# Patient Record
Sex: Female | Born: 1945 | Race: White | Hispanic: No | Marital: Married
Health system: Southern US, Academic
[De-identification: ages and names within clinical notes are randomized; demographics above are authoritative.]

## PROBLEM LIST (undated history)

## (undated) ENCOUNTER — Encounter: Attending: Pharmacist | Primary: Pharmacist

## (undated) ENCOUNTER — Ambulatory Visit

## (undated) ENCOUNTER — Encounter: Attending: Family Medicine | Primary: Family Medicine

## (undated) ENCOUNTER — Encounter

## (undated) ENCOUNTER — Ambulatory Visit: Payer: MEDICARE

## (undated) ENCOUNTER — Encounter: Attending: Gastroenterology | Primary: Gastroenterology

## (undated) ENCOUNTER — Encounter: Attending: Registered" | Primary: Registered"

## (undated) ENCOUNTER — Ambulatory Visit: Attending: Pharmacist | Primary: Pharmacist

## (undated) ENCOUNTER — Telehealth

## (undated) ENCOUNTER — Ambulatory Visit: Payer: MEDICARE | Attending: Obstetrics & Gynecology | Primary: Obstetrics & Gynecology

## (undated) ENCOUNTER — Ambulatory Visit: Payer: MEDICARE | Attending: Pharmacist | Primary: Pharmacist

## (undated) DIAGNOSIS — K269 Duodenal ulcer, unspecified as acute or chronic, without hemorrhage or perforation: Secondary | ICD-10-CM

## (undated) DIAGNOSIS — F32A Depression, unspecified: Secondary | ICD-10-CM

## (undated) DIAGNOSIS — R87610 Atypical squamous cells of undetermined significance on cytologic smear of cervix (ASC-US): Secondary | ICD-10-CM

## (undated) DIAGNOSIS — J479 Bronchiectasis, uncomplicated: Secondary | ICD-10-CM

## (undated) DIAGNOSIS — K219 Gastro-esophageal reflux disease without esophagitis: Secondary | ICD-10-CM

## (undated) DIAGNOSIS — Z8719 Personal history of other diseases of the digestive system: Secondary | ICD-10-CM

## (undated) DIAGNOSIS — F329 Major depressive disorder, single episode, unspecified: Secondary | ICD-10-CM

## (undated) DIAGNOSIS — T7840XA Allergy, unspecified, initial encounter: Secondary | ICD-10-CM

## (undated) DIAGNOSIS — I10 Essential (primary) hypertension: Secondary | ICD-10-CM

## (undated) DIAGNOSIS — E7800 Pure hypercholesterolemia, unspecified: Secondary | ICD-10-CM

## (undated) DIAGNOSIS — Z8619 Personal history of other infectious and parasitic diseases: Secondary | ICD-10-CM

## (undated) DIAGNOSIS — Z8669 Personal history of other diseases of the nervous system and sense organs: Secondary | ICD-10-CM

## (undated) HISTORY — PX: BRONCHOSCOPY: SUR163

## (undated) HISTORY — DX: Personal history of other diseases of the nervous system and sense organs: Z86.69

## (undated) HISTORY — DX: Bronchiectasis, uncomplicated: J47.9

## (undated) HISTORY — PX: EYE SURGERY: SHX253

## (undated) HISTORY — DX: Personal history of other infectious and parasitic diseases: Z86.19

## (undated) HISTORY — PX: NASAL SINUS SURGERY: SHX719

## (undated) HISTORY — PX: COLONOSCOPY: SHX174

## (undated) HISTORY — DX: Atypical squamous cells of undetermined significance on cytologic smear of cervix (ASC-US): R87.610

## (undated) HISTORY — DX: Allergy, unspecified, initial encounter: T78.40XA

## (undated) HISTORY — DX: Duodenal ulcer, unspecified as acute or chronic, without hemorrhage or perforation: K26.9

## (undated) HISTORY — PX: CATARACT EXTRACTION W/ INTRAOCULAR LENS  IMPLANT, BILATERAL: SHX1307

## (undated) HISTORY — PX: TONSILLECTOMY: SUR1361

---
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
## 2002-03-20 DIAGNOSIS — M810 Age-related osteoporosis without current pathological fracture: Secondary | ICD-10-CM | POA: Insufficient documentation

## 2003-09-06 ENCOUNTER — Other Ambulatory Visit: Payer: Self-pay

## 2011-06-04 DIAGNOSIS — Z1211 Encounter for screening for malignant neoplasm of colon: Secondary | ICD-10-CM | POA: Insufficient documentation

## 2011-12-27 DIAGNOSIS — N6019 Diffuse cystic mastopathy of unspecified breast: Secondary | ICD-10-CM | POA: Insufficient documentation

## 2012-01-24 ENCOUNTER — Ambulatory Visit: Payer: Self-pay | Admitting: Physical Medicine and Rehabilitation

## 2012-03-01 ENCOUNTER — Encounter: Payer: Self-pay | Admitting: Neurological Surgery

## 2012-03-22 ENCOUNTER — Encounter: Payer: Self-pay | Admitting: Neurological Surgery

## 2012-05-31 DIAGNOSIS — R7309 Other abnormal glucose: Secondary | ICD-10-CM | POA: Insufficient documentation

## 2012-06-08 DIAGNOSIS — R12 Heartburn: Secondary | ICD-10-CM | POA: Insufficient documentation

## 2012-06-10 ENCOUNTER — Ambulatory Visit: Payer: Self-pay | Admitting: Internal Medicine

## 2013-08-29 DIAGNOSIS — K2100 Gastro-esophageal reflux disease with esophagitis, without bleeding: Secondary | ICD-10-CM | POA: Insufficient documentation

## 2013-08-29 DIAGNOSIS — E785 Hyperlipidemia, unspecified: Secondary | ICD-10-CM | POA: Insufficient documentation

## 2013-12-04 DIAGNOSIS — G8929 Other chronic pain: Secondary | ICD-10-CM | POA: Insufficient documentation

## 2013-12-04 DIAGNOSIS — M542 Cervicalgia: Secondary | ICD-10-CM

## 2013-12-04 DIAGNOSIS — M545 Low back pain, unspecified: Secondary | ICD-10-CM | POA: Insufficient documentation

## 2014-01-23 DIAGNOSIS — G43909 Migraine, unspecified, not intractable, without status migrainosus: Secondary | ICD-10-CM | POA: Insufficient documentation

## 2014-05-22 ENCOUNTER — Emergency Department: Payer: Self-pay | Admitting: Emergency Medicine

## 2014-10-07 DIAGNOSIS — N814 Uterovaginal prolapse, unspecified: Secondary | ICD-10-CM | POA: Insufficient documentation

## 2014-11-04 DIAGNOSIS — N8110 Cystocele, unspecified: Secondary | ICD-10-CM | POA: Insufficient documentation

## 2014-11-21 ENCOUNTER — Other Ambulatory Visit: Payer: Self-pay | Admitting: Obstetrics and Gynecology

## 2014-11-21 ENCOUNTER — Other Ambulatory Visit: Payer: Self-pay | Admitting: Internal Medicine

## 2014-11-21 DIAGNOSIS — Z1231 Encounter for screening mammogram for malignant neoplasm of breast: Secondary | ICD-10-CM

## 2014-11-29 ENCOUNTER — Other Ambulatory Visit: Payer: Self-pay | Admitting: Obstetrics and Gynecology

## 2014-11-29 ENCOUNTER — Ambulatory Visit
Admission: RE | Admit: 2014-11-29 | Discharge: 2014-11-29 | Disposition: A | Discharge status: Home / Self Care | Payer: Medicare Other | Source: Ambulatory Visit | Attending: Obstetrics and Gynecology | Admitting: Obstetrics and Gynecology

## 2014-11-29 DIAGNOSIS — Z1231 Encounter for screening mammogram for malignant neoplasm of breast: Secondary | ICD-10-CM | POA: Diagnosis present

## 2014-12-27 DIAGNOSIS — M5030 Other cervical disc degeneration, unspecified cervical region: Secondary | ICD-10-CM | POA: Insufficient documentation

## 2015-04-17 ENCOUNTER — Ambulatory Visit: Payer: Medicare Other | Admitting: Anesthesiology

## 2015-04-24 ENCOUNTER — Encounter: Payer: Self-pay | Admitting: *Deleted

## 2015-04-24 ENCOUNTER — Emergency Department
Admission: EM | Admit: 2015-04-24 | Discharge: 2015-04-24 | Disposition: A | Discharge status: Home / Self Care | Payer: Medicare Other | Attending: Emergency Medicine | Admitting: Emergency Medicine

## 2015-04-24 DIAGNOSIS — I10 Essential (primary) hypertension: Secondary | ICD-10-CM | POA: Insufficient documentation

## 2015-04-24 DIAGNOSIS — R197 Diarrhea, unspecified: Secondary | ICD-10-CM | POA: Diagnosis present

## 2015-04-24 DIAGNOSIS — A0811 Acute gastroenteropathy due to Norwalk agent: Secondary | ICD-10-CM | POA: Insufficient documentation

## 2015-04-24 DIAGNOSIS — E119 Type 2 diabetes mellitus without complications: Secondary | ICD-10-CM | POA: Diagnosis not present

## 2015-04-24 HISTORY — DX: Cystic fibrosis, unspecified: E84.9

## 2015-04-24 HISTORY — DX: Essential (primary) hypertension: I10

## 2015-04-24 LAB — LIPASE, BLOOD: Lipase: 26 U/L (ref 11–51)

## 2015-04-24 LAB — CBC
HEMATOCRIT: 42.9 % (ref 35.0–47.0)
Hemoglobin: 14.7 g/dL (ref 12.0–16.0)
MCH: 28.8 pg (ref 26.0–34.0)
MCHC: 34.4 g/dL (ref 32.0–36.0)
MCV: 83.9 fL (ref 80.0–100.0)
Platelets: 232 10*3/uL (ref 150–440)
RBC: 5.11 MIL/uL (ref 3.80–5.20)
RDW: 14 % (ref 11.5–14.5)
WBC: 5.5 10*3/uL (ref 3.6–11.0)

## 2015-04-24 LAB — COMPREHENSIVE METABOLIC PANEL
ALBUMIN: 4.5 g/dL (ref 3.5–5.0)
ALT: 22 U/L (ref 14–54)
AST: 31 U/L (ref 15–41)
Alkaline Phosphatase: 63 U/L (ref 38–126)
Anion gap: 8 (ref 5–15)
BILIRUBIN TOTAL: 1 mg/dL (ref 0.3–1.2)
BUN: 6 mg/dL (ref 6–20)
CHLORIDE: 107 mmol/L (ref 101–111)
CO2: 21 mmol/L — ABNORMAL LOW (ref 22–32)
CREATININE: 0.7 mg/dL (ref 0.44–1.00)
Calcium: 9.5 mg/dL (ref 8.9–10.3)
GFR calc Af Amer: >60 mL/min (ref >60)
GFR calc non Af Amer: >60 mL/min (ref >60)
GLUCOSE: 111 mg/dL — AB (ref 65–99)
POTASSIUM: 3.7 mmol/L (ref 3.5–5.1)
Sodium: 136 mmol/L (ref 135–145)
TOTAL PROTEIN: 7.9 g/dL (ref 6.5–8.1)

## 2015-04-24 MED ORDER — ONDANSETRON HCL 4 MG PO TABS: 4.0000 mg | ORAL_TABLET | Freq: Every day | ORAL | Status: DC | PRN

## 2015-04-24 MED ORDER — SODIUM CHLORIDE 0.9 % IV SOLN
Freq: Once | INTRAVENOUS | Status: AC
  Administered 2015-04-24: 16:00:00 via INTRAVENOUS

## 2015-04-24 MED ORDER — ONDANSETRON HCL 4 MG/2ML IJ SOLN
4.0000 mg | Freq: Once | INTRAMUSCULAR | Status: AC
  Administered 2015-04-24: 4 mg via INTRAVENOUS
  Filled 2015-04-24: qty 2

## 2015-04-24 NOTE — ED Notes (Signed)
Pt sts that she has had n/v since Sunday.  Pt denies CP, SOB, LOC or injury.  NAD.  Pt able to ambulate to room.

## 2015-04-24 NOTE — Discharge Instructions (Signed)
Norovirus Infection °A norovirus infection is caused by exposure to a virus in a group of similar viruses (noroviruses). This type of infection causes inflammation in your stomach and intestines (gastroenteritis). Norovirus is the most common cause of gastroenteritis. It also causes food poisoning. °Anyone can get a norovirus infection. It spreads very easily (contagious). You can get it from contaminated food, water, surfaces, or other people. Norovirus is found in the stool or vomit of infected people. You can spread the infection as soon as you feel sick until 2 weeks after you recover.  °Symptoms usually begin within 2 days after you become infected. Most norovirus symptoms affect the digestive system. °CAUSES °Norovirus infection is caused by contact with norovirus. You can catch norovirus if you: °· Eat or drink something contaminated with norovirus. °· Touch surfaces or objects contaminated with norovirus and then put your hand in your mouth. °· Have direct contact with an infected person who has symptoms. °· Share food, drink, or utensils with someone with who is sick with norovirus. °SIGNS AND SYMPTOMS °Symptoms of norovirus may include: °· Nausea. °· Vomiting. °· Diarrhea. °· Stomach cramps. °· Fever. °· Chills. °· Headache. °· Muscle aches. °· Tiredness. °DIAGNOSIS °Your health care provider may suspect norovirus based on your symptoms and physical exam. Your health care provider may also test a sample of your stool or vomit for the virus.  °TREATMENT °There is no specific treatment for norovirus. Most people get better without treatment in about 2 days. °HOME CARE INSTRUCTIONS °· Replace lost fluids by drinking plenty of water or rehydration fluids containing important minerals called electrolytes. This prevents dehydration. Drink enough fluid to keep your urine clear or pale yellow. °· Do not prepare food for others while you are infected. Wait at least 3 days after recovering from the illness to do  that. °PREVENTION  °· Wash your hands often, especially after using the toilet or changing a diaper. °· Wash fruits and vegetables thoroughly before preparing or serving them. °· Throw out any food that a sick person may have touched. °· Disinfect contaminated surfaces immediately after someone in the household has been sick. Use a bleach-based household cleaner. °· Immediately remove and wash soiled clothes or sheets. °SEEK MEDICAL CARE IF: °· Your vomiting, diarrhea, and stomach pain is getting worse. °· Your symptoms of norovirus do not go away after 2-3 days. °SEEK IMMEDIATE MEDICAL CARE IF:  °You develop symptoms of dehydration that do not improve with fluid replacement. This may include: °· Excessive sleepiness. °· Lack of tears. °· Dry mouth. °· Dizziness when standing. °· Weak pulse. °  °This information is not intended to replace advice given to you by your health care provider. Make sure you discuss any questions you have with your health care provider. °  °Document Released: 05/29/2002 Document Revised: 03/29/2014 Document Reviewed: 08/16/2013 °Elsevier Interactive Patient Education ©2016 [employer] ° °

## 2015-04-24 NOTE — ED Provider Notes (Signed)
[facility] Emergency Department Provider Note     Time seen: ----------------------------------------- 3:53 PM on 04/24/2015 -----------------------------------------    I have reviewed the triage vital signs and the nursing notes.   HISTORY  Chief Complaint Emesis and Diarrhea    HPI Brenda Clarke is a 70 y.o. female who presents ER for vomiting and diarrhea for the last 4 days. Patient states she is unable to eat, has a history of cystic fibrosis. She was sent by her primary care doctor for same. She denies fevers, chills, chest pain, shortness of breath at this time. She states she has any anything or had anything to drink all the last 4 days.   Past Medical History  Diagnosis Date  . Cystic fibrosis ([place])   . Hypertension   . Diabetes mellitus without complication ([place])     There are no active problems to display for this patient.   History reviewed. No pertinent past surgical history.  Allergies Codeine  Social History Social History  Substance Use Topics  . Smoking status: Never Smoker   . Smokeless tobacco: None  . Alcohol Use: None    Review of Systems Constitutional: Negative for fever. Eyes: Negative for visual changes. ENT: Negative for sore throat. Cardiovascular: Negative for chest pain. Respiratory: Negative for shortness of breath. Gastrointestinal: Negative for abdominal pain, positive for vomiting and diarrhea Genitourinary: Negative for dysuria. Musculoskeletal: Negative for back pain. Skin: Negative for rash. Neurological: Negative for headaches, focal weakness or numbness.  10-point ROS otherwise negative.  ____________________________________________   PHYSICAL EXAM:  VITAL SIGNS: ED Triage Vitals  Enc Vitals Group     BP 04/24/15 1352 140/75 mmHg     Pulse Rate 04/24/15 1352 70     Resp 04/24/15 1352 18     Temp 04/24/15 1352 98.3 F (36.8 C)     Temp Source 04/24/15 1352 Oral     SpO2 04/24/15  1352 96 %     Weight 04/24/15 1352 122 lb (55.339 kg)     Height 04/24/15 1352 5\' 5"  (1.651 m)     Head Cir --      Peak Flow --      Pain Score --      Pain Loc --      Pain Edu? --      Excl. in [place]? --     Constitutional: Alert and oriented. Well appearing and in no distress. Eyes: Conjunctivae are normal. PERRL. Normal extraocular movements. ENT   Head: Normocephalic and atraumatic.   Nose: No congestion/rhinnorhea.   Mouth/Throat: Mucous membranes are moist.   Neck: No stridor. Cardiovascular: Normal rate, regular rhythm. Normal and symmetric distal pulses are present in all extremities. No murmurs, rubs, or gallops. Respiratory: Normal respiratory effort without tachypnea nor retractions. Breath sounds are clear and equal bilaterally. No wheezes/rales/rhonchi. Gastrointestinal: Soft and nontender. No distention. No abdominal bruits.  Musculoskeletal: Nontender with normal range of motion in all extremities. No joint effusions.  No lower extremity tenderness nor edema. Neurologic:  Normal speech and language. No gross focal neurologic deficits are appreciated. Speech is normal. No gait instability. Skin:  Skin is warm, dry and intact. No rash noted. Psychiatric: Mood and affect are normal. Speech and behavior are normal. Patient exhibits appropriate insight and judgment. ____________________________________________  ED COURSE:  Pertinent labs & imaging results that were available during my care of the patient were reviewed by me and considered in my medical decision making (see chart for details).  patient  is in no acute distress, will give IV fluid and antiemetics. ____________________________________________    LABS (pertinent positives/negatives)  Labs Reviewed  COMPREHENSIVE METABOLIC PANEL - Abnormal; Notable for the following:    CO2 21 (*)    Glucose, Bld 111 (*)    All other components within normal limits  LIPASE, BLOOD  CBC  URINALYSIS  COMPLETEWITH MICROSCOPIC ([facility] ONLY)  ____________________________________________  FINAL ASSESSMENT AND PLAN  Gastroenteritis  Plan: Patient with labs and dictated as  above. Patient looks remarkably good and labs are normal. She was given IV fluids, she is stable for outpatient follow-up with her primary care doctor and is able to tolerate food and drink by mouth.    Earleen Newport, MD   Earleen Newport, MD 04/24/15 1726

## 2015-04-24 NOTE — ED Notes (Signed)
States vomiting and diaherra for 4 days, states unable to eat, hx of cystic fibrosis, pt sent from PCP office

## 2015-06-03 ENCOUNTER — Emergency Department
Admission: EM | Admit: 2015-06-03 | Discharge: 2015-06-03 | Disposition: A | Discharge status: Home / Self Care | Payer: Medicare Other | Attending: Student | Admitting: Student

## 2015-06-03 ENCOUNTER — Encounter: Payer: Self-pay | Admitting: Emergency Medicine

## 2015-06-03 ENCOUNTER — Emergency Department: Payer: Medicare Other

## 2015-06-03 DIAGNOSIS — R197 Diarrhea, unspecified: Secondary | ICD-10-CM | POA: Insufficient documentation

## 2015-06-03 DIAGNOSIS — E119 Type 2 diabetes mellitus without complications: Secondary | ICD-10-CM | POA: Insufficient documentation

## 2015-06-03 DIAGNOSIS — I10 Essential (primary) hypertension: Secondary | ICD-10-CM | POA: Insufficient documentation

## 2015-06-03 DIAGNOSIS — R110 Nausea: Secondary | ICD-10-CM | POA: Diagnosis not present

## 2015-06-03 DIAGNOSIS — N390 Urinary tract infection, site not specified: Secondary | ICD-10-CM | POA: Diagnosis not present

## 2015-06-03 LAB — COMPREHENSIVE METABOLIC PANEL
ALT: 21 U/L (ref 14–54)
AST: 33 U/L (ref 15–41)
Albumin: 4.9 g/dL (ref 3.5–5.0)
Alkaline Phosphatase: 68 U/L (ref 38–126)
Anion gap: 11 (ref 5–15)
BUN: 14 mg/dL (ref 6–20)
CHLORIDE: 99 mmol/L — AB (ref 101–111)
CO2: 24 mmol/L (ref 22–32)
Calcium: 10.1 mg/dL (ref 8.9–10.3)
Creatinine, Ser: 0.84 mg/dL (ref 0.44–1.00)
GFR calc Af Amer: >60 mL/min (ref >60)
Glucose, Bld: 103 mg/dL — ABNORMAL HIGH (ref 65–99)
POTASSIUM: 3.4 mmol/L — AB (ref 3.5–5.1)
SODIUM: 134 mmol/L — AB (ref 135–145)
Total Bilirubin: 1.1 mg/dL (ref 0.3–1.2)
Total Protein: 8.4 g/dL — ABNORMAL HIGH (ref 6.5–8.1)

## 2015-06-03 LAB — CBC WITH DIFFERENTIAL/PLATELET
Basophils Absolute: 0 10*3/uL (ref 0–0.1)
Basophils Relative: 0 %
Eosinophils Absolute: 0.1 10*3/uL (ref 0–0.7)
Eosinophils Relative: 1 %
HCT: 46.7 % (ref 35.0–47.0)
HEMOGLOBIN: 16 g/dL (ref 12.0–16.0)
LYMPHS ABS: 1 10*3/uL (ref 1.0–3.6)
LYMPHS PCT: 10 %
MCH: 29.2 pg (ref 26.0–34.0)
MCHC: 34.2 g/dL (ref 32.0–36.0)
MCV: 85.3 fL (ref 80.0–100.0)
Monocytes Absolute: 0.6 10*3/uL (ref 0.2–0.9)
Monocytes Relative: 6 %
NEUTROS PCT: 83 %
Neutro Abs: 8.1 10*3/uL — ABNORMAL HIGH (ref 1.4–6.5)
Platelets: 255 10*3/uL (ref 150–440)
RBC: 5.47 MIL/uL — AB (ref 3.80–5.20)
RDW: 13.9 % (ref 11.5–14.5)
WBC: 9.9 10*3/uL (ref 3.6–11.0)

## 2015-06-03 LAB — URINALYSIS COMPLETE WITH MICROSCOPIC (ARMC ONLY)
Bilirubin Urine: NEGATIVE
Glucose, UA: NEGATIVE mg/dL
HGB URINE DIPSTICK: NEGATIVE
Ketones, ur: NEGATIVE mg/dL
Nitrite: NEGATIVE
Protein, ur: NEGATIVE mg/dL
SPECIFIC GRAVITY, URINE: 1.029 (ref 1.005–1.030)
pH: 5 (ref 5.0–8.0)

## 2015-06-03 MED ORDER — NITROFURANTOIN MONOHYD MACRO 100 MG PO CAPS
100.0000 mg | ORAL_CAPSULE | Freq: Once | ORAL | Status: AC
  Administered 2015-06-03: 100 mg via ORAL
  Filled 2015-06-03: qty 1

## 2015-06-03 MED ORDER — SODIUM CHLORIDE 0.9 % IV BOLUS (SEPSIS)
1000.0000 mL | Freq: Once | INTRAVENOUS | Status: AC
  Administered 2015-06-03: 1000 mL via INTRAVENOUS

## 2015-06-03 MED ORDER — ONDANSETRON 4 MG PO TBDP: 4.0000 mg | ORAL_TABLET | Freq: Three times a day (TID) | ORAL | Status: DC | PRN

## 2015-06-03 MED ORDER — NITROFURANTOIN MONOHYD MACRO 100 MG PO CAPS: 100.0000 mg | ORAL_CAPSULE | Freq: Two times a day (BID) | ORAL | Status: DC

## 2015-06-03 MED ORDER — ONDANSETRON 4 MG PO TBDP
4.0000 mg | ORAL_TABLET | Freq: Once | ORAL | Status: AC
  Administered 2015-06-03: 4 mg via ORAL
  Filled 2015-06-03: qty 1

## 2015-06-03 NOTE — ED Notes (Signed)
Presents with fever cough and cold sx's for a few days   Feels like she is "dehydrated"

## 2015-06-03 NOTE — ED Provider Notes (Signed)
[facility] Emergency Department Provider Note  ____________________________________________  Time seen: Approximately 8:20 PM  I have reviewed the triage vital signs and the nursing notes.   HISTORY  Chief Complaint Fever and Diarrhea    HPI Brenda Clarke is a 70 y.o. female with history of cystic fibrosis, hypertension, diabetes who presents for evaluation of 3 days of nausea as well as diarrhea, gradual onset, now improved however she feels generally weak, no modifying factors. She is here because she feels dehydrated. She has had nausea but no vomiting. She has had 3 episodes of nonbloody diarrhea. She adamantly denies fevers. Though it was noted in the triage assessment, she also adamantly denies cough, or any upper respiratory tract infection symptoms. She denies any chest pain, shortness of breath, abdominal pain, pain or burning with urination.   Past Medical History  Diagnosis Date  . Cystic fibrosis ([place])   . Hypertension   . Diabetes mellitus without complication ([place])     There are no active problems to display for this patient.   History reviewed. No pertinent past surgical history.  Current Outpatient Rx  Name  Route  Sig  Dispense  Refill  . nitrofurantoin, macrocrystal-monohydrate, (MACROBID) 100 MG capsule   Oral   Take 1 capsule (100 mg total) by mouth 2 (two) times daily.   14 capsule   0   . ondansetron (ZOFRAN ODT) 4 MG disintegrating tablet   Oral   Take 1 tablet (4 mg total) by mouth every 8 (eight) hours as needed for nausea or vomiting.   8 tablet   0   . ondansetron (ZOFRAN) 4 MG tablet   Oral   Take 1 tablet (4 mg total) by mouth daily as needed for nausea or vomiting.   20 tablet   1     Allergies Codeine  History reviewed. No pertinent family history.  Social History Social History  Substance Use Topics  . Smoking status: Never Smoker   . Smokeless tobacco: None  . Alcohol Use: None    Review of  Systems Constitutional: No fever/chills Eyes: No visual changes. ENT: No sore throat. Cardiovascular: Denies chest pain. Respiratory: Denies shortness of breath. Gastrointestinal: No abdominal pain.  + nausea, no vomiting.  + diarrhea.  No constipation. Genitourinary: Negative for dysuria. Musculoskeletal: Negative for back pain. Skin: Negative for rash. Neurological: Negative for headaches, focal weakness or numbness.  10-point ROS otherwise negative.  ____________________________________________   PHYSICAL EXAM:  VITAL SIGNS: ED Triage Vitals  Enc Vitals Group     BP 06/03/15 1637 160/93 mmHg     Pulse Rate 06/03/15 1637 80     Resp 06/03/15 1637 20     Temp 06/03/15 1637 97.8 F (36.6 C)     Temp Source 06/03/15 1637 Oral     SpO2 06/03/15 1637 98 %     Weight 06/03/15 1637 123 lb (55.792 kg)     Height 06/03/15 1637 5\' 6"  (1.676 m)     Head Cir --      Peak Flow --      Pain Score 06/03/15 1649 0     Pain Loc --      Pain Edu? --      Excl. in [place]? --     Constitutional: Alert and oriented. Well appearing and in no acute distress. Eyes: Conjunctivae are normal. PERRL. EOMI. Head: Atraumatic. Nose: No congestion/rhinnorhea. Mouth/Throat: Mucous membranes are moist.  Oropharynx non-erythematous. Neck: No stridor. Supple without meningismus.  Cardiovascular: Normal rate, regular rhythm. Grossly normal heart sounds.  Good peripheral circulation. Respiratory: Normal respiratory effort.  No retractions. Lungs CTAB. Gastrointestinal: Soft and nontender. No distention.  No CVA tenderness. Genitourinary: deferred Musculoskeletal: No lower extremity tenderness nor edema.  No joint effusions. Neurologic:  Normal speech and language. No gross focal neurologic deficits are appreciated. No gait instability. Skin:  Skin is warm, dry and intact. No rash noted. Psychiatric: Mood and affect are normal. Speech and behavior are  normal.  ____________________________________________   LABS (all labs ordered are listed, but only abnormal results are displayed)  Labs Reviewed  COMPREHENSIVE METABOLIC PANEL - Abnormal; Notable for the following:    Sodium 134 (*)    Potassium 3.4 (*)    Chloride 99 (*)    Glucose, Bld 103 (*)    Total Protein 8.4 (*)    All other components within normal limits  CBC WITH DIFFERENTIAL/PLATELET - Abnormal; Notable for the following:    RBC 5.47 (*)    Neutro Abs 8.1 (*)    All other components within normal limits  URINALYSIS COMPLETEWITH MICROSCOPIC ([facility] ONLY) - Abnormal; Notable for the following:    Color, Urine YELLOW (*)    APPearance CLEAR (*)    Leukocytes, UA 1+ (*)    Bacteria, UA RARE (*)    Squamous Epithelial / LPF 0-5 (*)    All other components within normal limits  URINE CULTURE   ____________________________________________  EKG  none ____________________________________________  RADIOLOGY  none ____________________________________________   PROCEDURES  Procedure(s) performed: None  Critical Care performed: No  ____________________________________________   INITIAL IMPRESSION / ASSESSMENT AND PLAN / ED COURSE  Pertinent labs & imaging results that were available during my care of the patient were reviewed by me and considered in my medical decision making (see chart for details).   Brenda Clarke is a 70 y.o. female with history of cystic fibrosis, hypertension, diabetes who presents for evaluation of 3 days of nausea as well as diarrhea, gradual onset, now improved however she feels generally weak. On exam, she is well-appearing and in no acute distress. Vital signs stable, she is afebrile. She has a benign physical examination and no acute plain complaints. CMP and CBC are generally unremarkable. Awaiting urinalysis. Suspect viral syndrome of the mostly because of her nausea and 3 episodes of diarrhea however given that she denies any  abdominal pain, fevers, upper respiratory tract infection symptoms, I do not think she requires any imaging or flu screen.   ----------------------------------------- 11:10 PM on 06/03/2015 ----------------------------------------- Analysis concerning for possible urinary tract infection. GFR, we'll give Macrobid. The patient is tolerating by mouth intake after Zofran. She has had no vomiting since arrival to the emergency department. We discussed return precautions, need for close PCP follow-up and she is comfortable with the discharge plan. DC home.  ____________________________________________   FINAL CLINICAL IMPRESSION(S) / ED DIAGNOSES  Final diagnoses:  Nausea  Diarrhea, unspecified type  UTI (lower urinary tract infection)      Joanne Gavel, MD 06/03/15 2311

## 2015-06-03 NOTE — ED Notes (Signed)
Reports diarrhea Saturday and Sunday.  States she thinks she is dehydrated.

## 2015-06-03 NOTE — ED Notes (Signed)
Pt expresses desire to leave, but also sts "I just don't feel good"

## 2015-06-03 NOTE — ED Notes (Addendum)
Pt vehemently denies n/v/d.  Pt denies having cough.  Pt sts that she simply hasn't had an appetite in 3 days and therefore hasn't eaten or drunk.  Pt sts lack of appetite isn't unusual for her, but has never gone on for 3 days.  Pt sts she came today because she felt like she might be dehydrated and wanted fluids.

## 2015-06-05 LAB — URINE CULTURE

## 2015-10-15 ENCOUNTER — Emergency Department: Payer: Medicare Other

## 2015-10-15 ENCOUNTER — Observation Stay
Admission: EM | Admit: 2015-10-15 | Discharge: 2015-10-15 | Discharge status: Against Medical Advice | Payer: Medicare Other | Attending: Specialist | Admitting: Specialist

## 2015-10-15 ENCOUNTER — Encounter: Payer: Self-pay | Admitting: Emergency Medicine

## 2015-10-15 DIAGNOSIS — M549 Dorsalgia, unspecified: Secondary | ICD-10-CM | POA: Diagnosis not present

## 2015-10-15 DIAGNOSIS — I10 Essential (primary) hypertension: Secondary | ICD-10-CM | POA: Insufficient documentation

## 2015-10-15 DIAGNOSIS — Z820 Family history of epilepsy and other diseases of the nervous system: Secondary | ICD-10-CM | POA: Insufficient documentation

## 2015-10-15 DIAGNOSIS — Z79899 Other long term (current) drug therapy: Secondary | ICD-10-CM | POA: Insufficient documentation

## 2015-10-15 DIAGNOSIS — G459 Transient cerebral ischemic attack, unspecified: Principal | ICD-10-CM | POA: Diagnosis present

## 2015-10-15 DIAGNOSIS — G8929 Other chronic pain: Secondary | ICD-10-CM | POA: Insufficient documentation

## 2015-10-15 DIAGNOSIS — M542 Cervicalgia: Secondary | ICD-10-CM | POA: Diagnosis not present

## 2015-10-15 DIAGNOSIS — E119 Type 2 diabetes mellitus without complications: Secondary | ICD-10-CM | POA: Diagnosis not present

## 2015-10-15 DIAGNOSIS — R4781 Slurred speech: Secondary | ICD-10-CM | POA: Diagnosis present

## 2015-10-15 DIAGNOSIS — I639 Cerebral infarction, unspecified: Secondary | ICD-10-CM

## 2015-10-15 DIAGNOSIS — Z5321 Procedure and treatment not carried out due to patient leaving prior to being seen by health care provider: Secondary | ICD-10-CM | POA: Diagnosis not present

## 2015-10-15 LAB — COMPREHENSIVE METABOLIC PANEL
ALBUMIN: 4.4 g/dL (ref 3.5–5.0)
ALK PHOS: 74 U/L (ref 38–126)
ALT: 17 U/L (ref 14–54)
AST: 25 U/L (ref 15–41)
Anion gap: 14 (ref 5–15)
BILIRUBIN TOTAL: 0.9 mg/dL (ref 0.3–1.2)
BUN: 27 mg/dL — AB (ref 6–20)
CALCIUM: 10.4 mg/dL — AB (ref 8.9–10.3)
CHLORIDE: 108 mmol/L (ref 101–111)
CO2: 25 mmol/L (ref 22–32)
CREATININE: 1.04 mg/dL — AB (ref 0.44–1.00)
GFR calc non Af Amer: 53 mL/min — ABNORMAL LOW (ref >60)
Glucose, Bld: 99 mg/dL (ref 65–99)
Potassium: 3.9 mmol/L (ref 3.5–5.1)
Sodium: 147 mmol/L — ABNORMAL HIGH (ref 135–145)
Total Protein: 7.5 g/dL (ref 6.5–8.1)

## 2015-10-15 LAB — CBC
HCT: 42.8 % (ref 35.0–47.0)
HEMOGLOBIN: 14.5 g/dL (ref 12.0–16.0)
MCH: 29.4 pg (ref 26.0–34.0)
MCHC: 33.9 g/dL (ref 32.0–36.0)
MCV: 86.6 fL (ref 80.0–100.0)
PLATELETS: 279 10*3/uL (ref 150–440)
RBC: 4.95 MIL/uL (ref 3.80–5.20)
RDW: 14.6 % — ABNORMAL HIGH (ref 11.5–14.5)
WBC: 7.4 10*3/uL (ref 3.6–11.0)

## 2015-10-15 LAB — DIFFERENTIAL
Basophils Absolute: 0.1 10*3/uL (ref 0–0.1)
Basophils Relative: 1 %
EOS ABS: 0.4 10*3/uL (ref 0–0.7)
EOS PCT: 5 %
LYMPHS ABS: 1.5 10*3/uL (ref 1.0–3.6)
Lymphocytes Relative: 20 %
MONO ABS: 0.7 10*3/uL (ref 0.2–0.9)
Monocytes Relative: 9 %
NEUTROS PCT: 65 %
Neutro Abs: 4.9 10*3/uL (ref 1.4–6.5)

## 2015-10-15 LAB — GLUCOSE, CAPILLARY: GLUCOSE-CAPILLARY: 98 mg/dL (ref 65–99)

## 2015-10-15 LAB — PROTIME-INR
INR: 1
PROTHROMBIN TIME: 13.2 s (ref 11.4–15.2)

## 2015-10-15 LAB — APTT: aPTT: 26 seconds (ref 24–36)

## 2015-10-15 LAB — TROPONIN I: Troponin I: <0.03 ng/mL (ref <0.03)

## 2015-10-15 MED ORDER — GABAPENTIN 100 MG PO CAPS: 100.0000 mg | ORAL_CAPSULE | Freq: Two times a day (BID) | ORAL | Status: DC

## 2015-10-15 MED ORDER — ONDANSETRON HCL 4 MG/2ML IJ SOLN: 4.0000 mg | Freq: Four times a day (QID) | INTRAMUSCULAR | Status: DC | PRN

## 2015-10-15 MED ORDER — CALCIUM CARBONATE ANTACID 500 MG PO CHEW: 2.0000 | CHEWABLE_TABLET | Freq: Every day | ORAL | Status: DC

## 2015-10-15 MED ORDER — PANTOPRAZOLE SODIUM 40 MG PO TBEC: 40.0000 mg | DELAYED_RELEASE_TABLET | Freq: Every day | ORAL | Status: DC

## 2015-10-15 MED ORDER — FAMOTIDINE 20 MG PO TABS: 20.0000 mg | ORAL_TABLET | Freq: Two times a day (BID) | ORAL | Status: DC

## 2015-10-15 MED ORDER — IVACAFTOR 150 MG PO TABS: 1.0000 | ORAL_TABLET | Freq: Two times a day (BID) | ORAL | Status: DC

## 2015-10-15 MED ORDER — ALBUTEROL SULFATE (2.5 MG/3ML) 0.083% IN NEBU: 3.0000 mL | INHALATION_SOLUTION | Freq: Four times a day (QID) | RESPIRATORY_TRACT | Status: DC | PRN

## 2015-10-15 MED ORDER — CALCIUM CARBONATE 500 MG PO CHEW: 1000.0000 mg | CHEWABLE_TABLET | Freq: Every day | ORAL | Status: DC

## 2015-10-15 MED ORDER — ATORVASTATIN CALCIUM 20 MG PO TABS: 40.0000 mg | ORAL_TABLET | Freq: Every day | ORAL | Status: DC

## 2015-10-15 MED ORDER — SODIUM CHLORIDE 10 % IN NEBU: 4.0000 mL | INHALATION_SOLUTION | Freq: Four times a day (QID) | RESPIRATORY_TRACT | Status: DC

## 2015-10-15 MED ORDER — OMEGA-3-ACID ETHYL ESTERS 1 G PO CAPS
2.0000 g | ORAL_CAPSULE | Freq: Every day | ORAL | Status: DC
Start: 2015-10-16 — End: 2015-10-16

## 2015-10-15 MED ORDER — ENALAPRIL MALEATE 2.5 MG PO TABS: 2.5000 mg | ORAL_TABLET | Freq: Every day | ORAL | Status: DC

## 2015-10-15 MED ORDER — METOPROLOL SUCCINATE ER 25 MG PO TB24: 25.0000 mg | ORAL_TABLET | Freq: Two times a day (BID) | ORAL | Status: DC

## 2015-10-15 MED ORDER — AZTREONAM LYSINE 75 MG IN SOLR: 75.0000 mg | Freq: Three times a day (TID) | RESPIRATORY_TRACT | Status: DC

## 2015-10-15 MED ORDER — OMEGA-3 1000 MG PO CAPS: 2000.0000 mg | ORAL_CAPSULE | Freq: Every day | ORAL | Status: DC

## 2015-10-15 MED ORDER — ACETAMINOPHEN 650 MG RE SUPP: 650.0000 mg | Freq: Four times a day (QID) | RECTAL | Status: DC | PRN

## 2015-10-15 MED ORDER — ZOLPIDEM TARTRATE 5 MG PO TABS: 5.0000 mg | ORAL_TABLET | Freq: Every evening | ORAL | Status: DC | PRN

## 2015-10-15 MED ORDER — ASPIRIN 81 MG PO CHEW
CHEWABLE_TABLET | ORAL | Status: AC
  Administered 2015-10-15: 324 mg via ORAL
  Filled 2015-10-15: qty 4

## 2015-10-15 MED ORDER — SODIUM CHLORIDE 0.9% FLUSH: 3.0000 mL | Freq: Two times a day (BID) | INTRAVENOUS | Status: DC

## 2015-10-15 MED ORDER — ACETAMINOPHEN 325 MG PO TABS: 650.0000 mg | ORAL_TABLET | Freq: Four times a day (QID) | ORAL | Status: DC | PRN

## 2015-10-15 MED ORDER — GALANTAMINE HYDROBROMIDE 4 MG PO TABS
4.0000 mg | ORAL_TABLET | ORAL | Status: DC
  Filled 2015-10-15: qty 1

## 2015-10-15 MED ORDER — ASPIRIN 81 MG PO CHEW
324.0000 mg | CHEWABLE_TABLET | Freq: Once | ORAL | Status: AC
  Administered 2015-10-15: 324 mg via ORAL

## 2015-10-15 MED ORDER — ASPIRIN EC 81 MG PO TBEC: 81.0000 mg | DELAYED_RELEASE_TABLET | Freq: Every day | ORAL | Status: DC

## 2015-10-15 MED ORDER — ONDANSETRON HCL 4 MG PO TABS: 4.0000 mg | ORAL_TABLET | Freq: Four times a day (QID) | ORAL | Status: DC | PRN

## 2015-10-15 NOTE — ED Triage Notes (Signed)
Pt began with headache at 1650 and then slurred speech at 1750. Pt unable to repeat "no ifs ands or buts" correctly. Having difficulty giving symptom history. C/o weakness bilateral legs. No other symptoms.

## 2015-10-15 NOTE — Consult Note (Signed)
Met w/spouse at CT waiting room & offered pastoral care. Will provide [place] support as needed.  Tomasita Crumble Gwenda [phone]

## 2015-10-15 NOTE — ED Notes (Signed)
CBG 98  

## 2015-10-15 NOTE — H&P (Addendum)
[place] [facility] [place] NAME: Brenda Clarke    MR#:  [id number]  DATE OF BIRTH:  07-Apr-1945  DATE OF ADMISSION:  10/15/2015  PRIMARY CARE PHYSICIAN: Rusty Aus, MD   REQUESTING/REFERRING PHYSICIAN: Dr. Lisa Roca  CHIEF COMPLAINT:   Chief Complaint  Patient presents with  . Stroke Symptoms    HISTORY OF PRESENT ILLNESS:  Brenda Clarke  is a 70 y.o. female with a known history of Cystic fibrosis, diabetes, hypertension who presents to the hospital due to slurred speech. Patient says she developed slurred speech this afternoon which lasted only a few minutes and resolved on its own. She denied any headache, numbness, tingling or any focal weakness associated with it. She is complaining of this chronic neck/back pain which she has been dealing with for quite a while. She denies any other associated symptoms. Her CT head did not show any evidence of acute abnormality. Hospitalist services clinic for treatment and evaluation.  PAST MEDICAL HISTORY:   Past Medical History:  Diagnosis Date  . Cystic fibrosis ([place])   . Diabetes mellitus without complication ([place])   . Hypertension     PAST SURGICAL HISTORY:  History reviewed. No pertinent surgical history.  SOCIAL HISTORY:   Social History  Substance Use Topics  . Smoking status: Never Smoker  . Smokeless tobacco: Not on file  . Alcohol use Not on file    FAMILY HISTORY:   Family History  Problem Relation Clarke of Onset  . Alzheimer's disease Mother     DRUG ALLERGIES:   Allergies  Allergen Reactions  . Tobramycin Shortness Of Breath    wheezing  . Codeine Itching and Nausea Only  . Hydrocodone Itching    REVIEW OF SYSTEMS:   Review of Systems  Constitutional: Negative for fever and weight loss.  HENT: Negative for congestion, nosebleeds and tinnitus.   Eyes: Negative for blurred vision, double vision and redness.  Respiratory: Negative for cough, hemoptysis and shortness  of breath.   Cardiovascular: Negative for chest pain, orthopnea, leg swelling and PND.  Gastrointestinal: Negative for abdominal pain, diarrhea, melena, nausea and vomiting.  Genitourinary: Negative for dysuria, hematuria and urgency.  Musculoskeletal: Negative for falls and joint pain.  Neurological: Positive for speech change. Negative for dizziness, tingling, sensory change, focal weakness, seizures, weakness and headaches.  Endo/Heme/Allergies: Negative for polydipsia. Does not bruise/bleed easily.  Psychiatric/Behavioral: Negative for depression and memory loss. The patient is not nervous/anxious.     MEDICATIONS AT HOME:   Prior to Admission medications   Medication Sig Start Date End Date Taking? Authorizing Provider  albuterol (PROVENTIL HFA;VENTOLIN HFA) 108 (90 Base) MCG/ACT inhaler Inhale 2 puffs into the lungs every 6 (six) hours as needed. 09/25/15  Yes Historical Provider, MD  Aztreonam Lysine 75 MG SOLR Inhale 75 mg into the lungs 3 (three) times daily. Alternating months 05/26/15  Yes Historical Provider, MD  Calcium Carbonate 500 MG CHEW Chew 1,000 mg by mouth daily.   Yes Historical Provider, MD  enalapril (VASOTEC) 5 MG tablet Take 2.5 mg by mouth daily. 05/31/14  Yes Historical Provider, MD  gabapentin (NEURONTIN) 100 MG capsule Take 100 mg by mouth 2 (two) times daily.   Yes Historical Provider, MD  galantamine (RAZADYNE) 4 MG tablet Take 4 mg by mouth every morning. 06/10/15 06/09/16 Yes Historical Provider, MD  Ivacaftor 150 MG TABS Take 1 tablet by mouth 2 (two) times daily. 03/31/15 03/30/16 Yes Historical Provider, MD  metoprolol succinate (  TOPROL-XL) 25 MG 24 hr tablet Take 1 tablet by mouth 2 (two) times daily. 05/27/15  Yes Historical Provider, MD  Omega-3 1000 MG CAPS Take 2 g by mouth daily.   Yes Historical Provider, MD  omeprazole (PRILOSEC) 40 MG capsule Take 1 capsule by mouth daily. 05/27/15  Yes Historical Provider, MD  ranitidine (ZANTAC) 300 MG tablet Take 1 tablet by  mouth every evening. 05/27/15  Yes Historical Provider, MD  simvastatin (ZOCOR) 80 MG tablet Take 0.5 tablets by mouth every evening. 08/04/15  Yes Historical Provider, MD  Sodium Chloride 10 % NEBU Take 4 mLs by nebulization 4 (four) times daily. 05/30/15 05/29/16 Yes Historical Provider, MD  zolpidem (AMBIEN CR) 12.5 MG CR tablet Take 12.5 mg by mouth at bedtime. 05/22/13  Yes Historical Provider, MD      VITAL SIGNS:  Blood pressure (!) 147/86, pulse 67, temperature 98.1 F (36.7 C), resp. rate 18, height 5\' 6"  (1.676 m), weight 53.1 kg (117 lb), SpO2 98 %.  PHYSICAL EXAMINATION:  Physical Exam  GENERAL:  70 y.o.-year-old anxious patient lying in the bed in no acute distress.  EYES: Pupils equal, round, reactive to light and accommodation. No scleral icterus. Extraocular muscles intact.  HEENT: Head atraumatic, normocephalic. Oropharynx and nasopharynx clear. No oropharyngeal erythema, moist oral mucosa  NECK:  Supple, no jugular venous distention. No thyroid enlargement, no tenderness.  LUNGS: Normal breath sounds bilaterally, no wheezing, rales, rhonchi. No use of accessory muscles of respiration.  CARDIOVASCULAR: S1, S2 RRR. No murmurs, rubs, gallops, clicks.  ABDOMEN: Soft, nontender, nondistended. Bowel sounds present. No organomegaly or mass.  EXTREMITIES: No pedal edema, cyanosis, or clubbing. + 2 pedal & radial pulses b/l.   NEUROLOGIC: Cranial nerves II through XII are intact. No focal Motor or sensory deficits appreciated b/l PSYCHIATRIC: The patient is alert and oriented x 3. anxious  SKIN: No obvious rash, lesion, or ulcer.   LABORATORY PANEL:   CBC  Recent Labs Lab 10/15/15 1810  WBC 7.4  HGB 14.5  HCT 42.8  PLT 279   ------------------------------------------------------------------------------------------------------------------  Chemistries   Recent Labs Lab 10/15/15 1810  NA 147*  K 3.9  CL 108  CO2 25  GLUCOSE 99  BUN 27*  CREATININE 1.04*  CALCIUM  10.4*  AST 25  ALT 17  ALKPHOS 74  BILITOT 0.9   ------------------------------------------------------------------------------------------------------------------  Cardiac Enzymes  Recent Labs Lab 10/15/15 1810  TROPONINI <0.03   ------------------------------------------------------------------------------------------------------------------  RADIOLOGY:  Ct Head Code Stroke W/o Cm  Result Date: 10/15/2015 CLINICAL DATA:  Headache around 4:50, now with slurred speech. Code stroke. EXAM: CT HEAD WITHOUT CONTRAST TECHNIQUE: Contiguous axial images were obtained from the base of the skull through the vertex without intravenous contrast. COMPARISON:  None. FINDINGS: Scattered minimal periventricular hypodensities, left greater than right, compatible with microvascular ischemic disease. The gray-white differentiation is otherwise well maintained without CT evidence of acute large territory infarct. No intraparenchymal or extra-axial mass or hemorrhage. Normal size and configuration of the ventricles and basilar cisterns. No midline shift. Limited visualization the paranasal sinuses and mastoid air cells is normal. No air-fluid levels. Regional soft tissues appear normal. 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 Critical Value/emergent results were called by telephone at the time of interpretation on 10/15/2015 at 6:28 pm to Dr. Lisa Roca , who verbally acknowledged these results. Electronically Signed   By: Sandi Mariscal M.D.   On: 10/15/2015 18:31    IMPRESSION AND PLAN:   70 year old female with past  medical history of cystic fibrosis, hypertension, diabetes presents to the hospital with slurred speech.  1. TIA-this is a clinical diagnosis given patient's transient neurologic symptoms which have now resolved. -Patient's CT head shows no evidence of acute CVA. We will observe her overnight, get a MRI of the brain, carotid duplex, and  two-dimensional echocardiogram. -Place on baby aspirin, simvastatin  2. Essential hypertension-continue Vasotec, Toprol.  3. History of cystic fibrosis-continue aztreonam lysine, Ivacaftor, Nebulized saline.  - no acute issue  4. GERD - cont. Protonix, Pepcid.   5. Neuropathy - cont. Neurontin.    All the records are reviewed and case discussed with ED provider. Management plans discussed with the patient, family and they are in agreement.  CODE STATUS: Full   TOTAL TIME TAKING CARE OF THIS PATIENT: 40 minutes.    Henreitta Leber M.D on 10/15/2015 at 8:48 PM  Between 7am to 6pm - Pager - [phone]  After 6pm go to [email or web address] - password [email or web address] [facility]  Office  [phone]  CC: Primary care physician; Rusty Aus, MD

## 2015-10-15 NOTE — ED Notes (Signed)
Pt assisted to toilet - she is severely agitated because she has not seen the admitting doctor and has not received her "night time medication" specifically Lorrin Mais - she is c/o neck pain and states that she is frustrated that no one here cares about her "bulging disc" in her neck - the admitting doctor arrived to room and she was agitated that it took him so long and she stated that she would never recommend this "horrible hospital" to anyone - this nurse attempted to talk to her and listen to her voice her concerns to attempt and calm her down without results - will return to room when admitting doctor is finished and let pt finish venting at her request

## 2015-10-15 NOTE — ED Provider Notes (Signed)
[facility] Emergency Department Provider Note ____________________________________________  Time seen:  I have reviewed the triage vital signs and the triage nursing note.  HISTORY  Chief Complaint Stroke Symptoms   Historian Patient  HPI Brenda Clarke is a 70 y.o. female with a history of headaches although doesn't sound like a certain diagnosis of migraines, reports that she frequently has neck pain that she sees a chiropractor who does adjustments, and she was feeling today like she developed a headache starting at the base of her neck on the left side where she has neck pain/spasm. This started around 4:30 and around 550 she had onset of slurred speech and trouble finding her words.  Symptoms ongoing upon patient arrival to the emergency department around 6:15 PM  Patient made a code stroke from triage by triage nurse and went to CT scan and upon return patient slurred speech resolved.  She is continuing to have a global headache that seems to started in the base of her skull similar to prior headaches, but she has never had any neurologic symptoms with prior headaches.  No headaches. No significant travel history exposures. No trauma.    Past Medical History:  Diagnosis Date  . 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 Diabetes mellitus without complication ([place])   . Hypertension     There are no active problems to display for this patient.   History reviewed. No pertinent surgical history.  Prior to Admission medications   Medication Sig Start Date End Date Taking? Authorizing Provider  albuterol (PROVENTIL HFA;VENTOLIN HFA) 108 (90 Base) MCG/ACT inhaler Inhale 2 puffs into the lungs every 6 (six) hours as needed. 09/25/15  Yes Historical Provider, MD  Aztreonam Lysine 75 MG SOLR Inhale 75 mg into the lungs 3 (three) times daily. Alternating months 05/26/15  Yes Historical Provider, MD  Calcium Carbonate 500 MG CHEW Chew 1,000 mg by mouth daily.   Yes Historical  Provider, MD  enalapril (VASOTEC) 5 MG tablet Take 2.5 mg by mouth daily. 05/31/14  Yes Historical Provider, MD  gabapentin (NEURONTIN) 100 MG capsule Take 100 mg by mouth 2 (two) times daily.   Yes Historical Provider, MD  galantamine (RAZADYNE) 4 MG tablet Take 4 mg by mouth every morning. 06/10/15 06/09/16 Yes Historical Provider, MD  Ivacaftor 150 MG TABS Take 1 tablet by mouth 2 (two) times daily. 03/31/15 03/30/16 Yes Historical Provider, MD  metoprolol succinate (TOPROL-XL) 25 MG 24 hr tablet Take 1 tablet by mouth 2 (two) times daily. 05/27/15  Yes Historical Provider, MD  Omega-3 1000 MG CAPS Take 2 g by mouth daily.   Yes Historical Provider, MD  omeprazole (PRILOSEC) 40 MG capsule Take 1 capsule by mouth daily. 05/27/15  Yes Historical Provider, MD  ranitidine (ZANTAC) 300 MG tablet Take 1 tablet by mouth every evening. 05/27/15  Yes Historical Provider, MD  simvastatin (ZOCOR) 80 MG tablet Take 0.5 tablets by mouth every evening. 08/04/15  Yes Historical Provider, MD  Sodium Chloride 10 % NEBU Take 4 mLs by nebulization 4 (four) times daily. 05/30/15 05/29/16 Yes Historical Provider, MD  zolpidem (AMBIEN CR) 12.5 MG CR tablet Take 12.5 mg by mouth at bedtime. 05/22/13  Yes Historical Provider, MD    Allergies  Allergen Reactions  . Tobramycin Shortness Of Breath    wheezing  . Codeine Itching and Nausea Only  . Hydrocodone Itching    History reviewed. No pertinent family history.  Social History Social History  Substance Use Topics  . Smoking  status: Never Smoker  . Smokeless tobacco: Not on file  . Alcohol use Not on file    Review of Systems  Constitutional: Negative for fever. Eyes: Negative for visual changes. ENT: Negative for sore throat. Cardiovascular: Negative for chest pain. Respiratory: Negative for shortness of breath. Gastrointestinal: Negative for abdominal pain, vomiting and diarrhea. Genitourinary: Negative for dysuria. Musculoskeletal: Negative for back  pain. Skin: Negative for rash. Neurological: Positive for headache. 10 point Review of Systems otherwise negative ____________________________________________   PHYSICAL EXAM:  VITAL SIGNS: ED Triage Vitals  Enc Vitals Group     BP 10/15/15 1808 99/82     Pulse Rate 10/15/15 1808 67     Resp 10/15/15 1808 16     Temp 10/15/15 1808 97.8 F (36.6 C)     Temp Source 10/15/15 1808 Oral     SpO2 10/15/15 1808 97 %     Weight 10/15/15 1853 117 lb (53.1 kg)     Height 10/15/15 1853 5\' 6"  (1.676 m)     Head Circumference --      Peak Flow --      Pain Score --      Pain Loc --      Pain Edu? --      Excl. in [place]? --      Constitutional: Alert and oriented. Well appearing and in no distress. HEENT   Head: Normocephalic and atraumatic.      Eyes: Conjunctivae are normal. PERRL. Normal extraocular movements.      Ears:         Nose: No congestion/rhinnorhea.   Mouth/Throat: Mucous membranes are moist.   Neck: No stridor.  Mildly tender musculature at the base of the neck/occiput especially on the left. Cardiovascular/Chest: Normal rate, regular rhythm.  No murmurs, rubs, or gallops. Respiratory: Normal respiratory effort without tachypnea nor retractions. Breath sounds are clear and equal bilaterally. No wheezes/rales/rhonchi. Gastrointestinal: Soft. No distention, no guarding, no rebound. Nontender.    Genitourinary/rectal:Deferred Musculoskeletal: Nontender with normal range of motion in all extremities. No joint effusions.  No lower extremity tenderness.  No edema. Neurologic:  No facial droop. No expressive or receptive aphasia. Normal speech and language. No gross or focal neurologic deficits are appreciated.  Normal strength, sensation, and coordination.  Skin:  Skin is warm, dry and intact. No rash noted. Psychiatric: Mood and affect are normal. Speech and behavior are normal. Patient exhibits appropriate insight and  judgment.  ____________________________________________   EKG I, Lisa Roca, MD, the attending physician have personally viewed and interpreted all ECGs.   ____________________________________________  LABS (pertinent positives/negatives)  Labs Reviewed  CBC - Abnormal; Notable for the following:       Result Value   RDW 14.6 (*)    All other components within normal limits  COMPREHENSIVE METABOLIC PANEL - Abnormal; Notable for the following:    Sodium 147 (*)    BUN 27 (*)    Creatinine, Ser 1.04 (*)    Calcium 10.4 (*)    GFR calc non Af Amer 53 (*)    All other components within normal limits  PROTIME-INR  APTT  DIFFERENTIAL  TROPONIN I  GLUCOSE, CAPILLARY  CBG MONITORING, ED    ____________________________________________  RADIOLOGY All Xrays were viewed by me. Imaging interpreted by Radiologist.  Ct head without contrast:  IMPRESSION: Minimal microvascular ischemic disease without acute intracranial process. Critical Value/emergent results were called by telephone at the time of interpretation on 10/15/2015 at 6:28 pm to Dr. Lisa Roca ,  who verbally acknowledged these results. Electronically Signed   By: Sandi Mariscal M.D.   On: 10/15/2015 18:31 __________________________________________  PROCEDURES  Procedure(s) performed: None  Critical Care performed: CRITICAL CARE Performed by: Lisa Roca   Total critical care time: 30 minutes  Critical care time was exclusive of separately billable procedures and treating other patients.  Critical care was necessary to treat or prevent imminent or life-threatening deterioration.  Critical care was time spent personally by me on the following activities: development of treatment plan with patient and/or surrogate as well as nursing, discussions with consultants, evaluation of patient's response to treatment, examination of patient, obtaining history from patient or surrogate, ordering and performing  treatments and interventions, ordering and review of laboratory studies, ordering and review of radiographic studies, pulse oximetry and re-evaluation of patient's condition.   ____________________________________________   ED COURSE / ASSESSMENT AND PLAN  Pertinent labs & imaging results that were available during my care of the patient were reviewed by me and considered in my medical decision making (see chart for details).   I was made aware that the patient was having slurred speech with in TPA window by triage nurse. Patient in CT scan  Code stroke was initiated with tele neurology consultation.  On my examination, symptoms had resolved and stroke scale is 0.  I spoke with the neurologist who recommended admission for TIA workup/rule out. 325 mg aspirin now. Consideration alternatively for migraine.  Patient was given Toradol and Flexeril for muscle spasm which she feels started her headache.  No additional episodes of neurologic impairment sixth throughout the rest of her ED stay.  CONSULTATIONS:  Tele-neurologist, recommends given symptoms and resolution no TPA. Recommended 325 aspirin and admission, other recommendations on the chart.  Hospitalist, Dr. Verdell Carmine, for admission.   Patient / Family / Caregiver informed of clinical course, medical decision-making process, and agree with plan.    ___________________________________________   FINAL CLINICAL IMPRESSION(S) / ED DIAGNOSES   Final diagnoses:  Transient cerebral ischemia, unspecified transient cerebral ischemia type  Slurred speech              Note: This dictation was prepared with Dragon dictation. Any transcriptional errors that result from this process are unintentional    Lisa Roca, MD 10/15/15 2023

## 2015-10-15 NOTE — Progress Notes (Addendum)
Pt refused to stay at this hospital. Pt states that she is afraid she is going to catch an infectious disease, states that [facility] cystic fibrosis patients have their own floor. Dr. Jannifer Franklin notified, did not recommend discharge. Educated patient about risks about leaving, pt refused to listen and asked for the husband to be called. Camera operator notified, supervisor [employer] notified. When husband came, pt and husband refused to sign the AMA form. Pt appeared angry and refused to be educated. Pt walked out with husband, Brenda Clarke walked out with patient.

## 2015-10-15 NOTE — ED Notes (Addendum)
Taken to CT scanner with RN

## 2015-10-15 NOTE — Progress Notes (Signed)
Called by nurse that patient wants to leave AMA.  States she has cystic fibrosis and is concerned she will acquire an infection.  Admitted for TIA.  Informed patient education that until MRI is done we can not know if she had small stroke or not, despite symptoms resolution.  If she leaves prior to this study being done she could potentially be at risk for larger, even fatal stroke.  Requested nurse have patient sign AMA form if she insists on leaving, and that she be instructed to seek MRI testing in outpatient setting at that point, and that if she experiences any further symptoms she is advised to seek emergency medical attention.  [name] 10/15/2015, 10:09 PM

## 2015-10-15 NOTE — ED Notes (Signed)
Pt continued to verbalize unhappiness with this ED but once this nurse listened to here complaints and reoriented her to the room she appeared to be less anxious (pt does have a history of dementia per spouse who is no longer at bedside which could be contributing to the agitation) - all questions answered about care and admission process - Pt given another warm blanket and lights turned off per pt request

## 2015-10-16 NOTE — Discharge Summary (Signed)
This is a brief discharge summary for patient  Brenda Clarke   70 year old female with past history of cystic fibrosis, essential hypertension and was admitted for slurred speech and workup for TIA. She left AGAINST MEDICAL ADVICE on the evening of 10/15/2015. She refused to sign the AMA form.

## 2015-10-17 ENCOUNTER — Other Ambulatory Visit: Payer: Self-pay | Admitting: Internal Medicine

## 2015-10-17 ENCOUNTER — Ambulatory Visit
Admission: RE | Admit: 2015-10-17 | Discharge: 2015-10-17 | Disposition: A | Discharge status: Home / Self Care | Payer: Medicare Other | Source: Ambulatory Visit | Attending: Internal Medicine | Admitting: Internal Medicine

## 2015-10-17 DIAGNOSIS — I6782 Cerebral ischemia: Secondary | ICD-10-CM | POA: Diagnosis not present

## 2015-10-17 DIAGNOSIS — I639 Cerebral infarction, unspecified: Secondary | ICD-10-CM | POA: Insufficient documentation

## 2015-10-17 DIAGNOSIS — G319 Degenerative disease of nervous system, unspecified: Secondary | ICD-10-CM | POA: Diagnosis not present

## 2015-10-30 ENCOUNTER — Other Ambulatory Visit: Payer: Self-pay | Admitting: Internal Medicine

## 2015-10-30 DIAGNOSIS — Z1231 Encounter for screening mammogram for malignant neoplasm of breast: Secondary | ICD-10-CM

## 2015-11-11 ENCOUNTER — Encounter: Payer: Self-pay | Admitting: Emergency Medicine

## 2015-11-11 ENCOUNTER — Emergency Department: Payer: Medicare Other

## 2015-11-11 ENCOUNTER — Emergency Department
Admission: EM | Admit: 2015-11-11 | Discharge: 2015-11-11 | Disposition: A | Discharge status: Home / Self Care | Payer: Medicare Other | Attending: Student | Admitting: Student

## 2015-11-11 DIAGNOSIS — T82898A Other specified complication of vascular prosthetic devices, implants and grafts, initial encounter: Secondary | ICD-10-CM

## 2015-11-11 DIAGNOSIS — M7989 Other specified soft tissue disorders: Secondary | ICD-10-CM | POA: Diagnosis present

## 2015-11-11 DIAGNOSIS — M7981 Nontraumatic hematoma of soft tissue: Secondary | ICD-10-CM | POA: Diagnosis not present

## 2015-11-11 DIAGNOSIS — Z79899 Other long term (current) drug therapy: Secondary | ICD-10-CM | POA: Insufficient documentation

## 2015-11-11 DIAGNOSIS — I10 Essential (primary) hypertension: Secondary | ICD-10-CM | POA: Insufficient documentation

## 2015-11-11 DIAGNOSIS — T148XXA Other injury of unspecified body region, initial encounter: Secondary | ICD-10-CM

## 2015-11-11 DIAGNOSIS — I809 Phlebitis and thrombophlebitis of unspecified site: Secondary | ICD-10-CM

## 2015-11-11 DIAGNOSIS — T801XXA Vascular complications following infusion, transfusion and therapeutic injection, initial encounter: Secondary | ICD-10-CM

## 2015-11-11 NOTE — ED Triage Notes (Signed)
Had colonoscopy today at [facility]. Colonoscopy went well. After got home noted hematoma back of R hand where IV site was.

## 2015-11-11 NOTE — ED Provider Notes (Signed)
[facility] Emergency Department Provider Note ____________________________________________  Time seen: 76  I have reviewed the triage vital signs and the nursing notes.  HISTORY  Chief Complaint  Other (hematoma R hand)  HPI Brenda Clarke is a 69 y.o. female presents to the ED for evaluation of a presumed hematoma to the dorsal aspect of her right hand. The patient describes she had a routine colonoscopy this morning at [facility]. She did confirm that the nurses able to place her IV in the right hand without difficulty, on the first attempt. Following the procedure her husband was present with her in recovery, does not recall being told that there was any problems with her IV infiltrating during the procedure. He was advised to keep the pressure bandage that was placed on the hand, in place until the patient made it home. The patient describes removing the gauze bandage to note soft tissue swelling, and dark colored hematoma to the dorsal aspect of the hand. She denies any pain, active bleeding, or distal paresthesias. She admits to attempting to call the outpatient GI Center but did not get a return phone call. She subsequently went to a local urgent care center, and was immediately referred to the ED for further evaluation and management. She presents now reporting decreased swelling to the dorsal hand after application of ice. She still complains of dorsal hematoma, and admittedly is anxious and concerned for possible blood clot.  Past Medical History:  Diagnosis Date  . 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 Hypertension     Patient Active Problem List   Diagnosis Date Noted  . TIA (transient ischemic attack) 10/15/2015    Past Surgical History:  Procedure Laterality Date  . BRONCHOSCOPY    . COLONOSCOPY      Prior to Admission medications   Medication Sig Start Date End Date Taking? Authorizing Provider  albuterol (PROVENTIL HFA;VENTOLIN HFA) 108 (90 Base) MCG/ACT  inhaler Inhale 2 puffs into the lungs every 6 (six) hours as needed. 09/25/15   Historical Provider, MD  Aztreonam Lysine 75 MG SOLR Inhale 75 mg into the lungs 3 (three) times daily. Alternating months 05/26/15   Historical Provider, MD  Calcium Carbonate 500 MG CHEW Chew 1,000 mg by mouth daily.    Historical Provider, MD  enalapril (VASOTEC) 5 MG tablet Take 2.5 mg by mouth daily. 05/31/14   Historical Provider, MD  gabapentin (NEURONTIN) 100 MG capsule Take 100 mg by mouth 2 (two) times daily.    Historical Provider, MD  galantamine (RAZADYNE) 4 MG tablet Take 4 mg by mouth every morning. 06/10/15 06/09/16  Historical Provider, MD  Ivacaftor 150 MG TABS Take 1 tablet by mouth 2 (two) times daily. 03/31/15 03/30/16  Historical Provider, MD  metoprolol succinate (TOPROL-XL) 25 MG 24 hr tablet Take 1 tablet by mouth 2 (two) times daily. 05/27/15   Historical Provider, MD  Omega-3 1000 MG CAPS Take 2 g by mouth daily.    Historical Provider, MD  omeprazole (PRILOSEC) 40 MG capsule Take 1 capsule by mouth daily. 05/27/15   Historical Provider, MD  ranitidine (ZANTAC) 300 MG tablet Take 1 tablet by mouth every evening. 05/27/15   Historical Provider, MD  simvastatin (ZOCOR) 80 MG tablet Take 0.5 tablets by mouth every evening. 08/04/15   Historical Provider, MD  Sodium Chloride 10 % NEBU Take 4 mLs by nebulization 4 (four) times daily. 05/30/15 05/29/16  Historical Provider, MD  zolpidem (AMBIEN CR) 12.5 MG CR tablet Take 12.5 mg by  mouth at bedtime. 05/22/13   Historical Provider, MD    Allergies Tobramycin; Codeine; and Hydrocodone  Family History  Problem Relation Clarke of Onset  . Alzheimer's disease Mother     Social History Social History  Substance Use Topics  . Smoking status: Never Smoker  . Smokeless tobacco: Not on file  . Alcohol use Not on file    Review of Systems  Constitutional: Negative for fever. Cardiovascular: Negative for chest pain.Right dorsal hand hematoma as above. Respiratory:  Negative for shortness of breath. Musculoskeletal: Negative for back pain. Skin: Negative for rash. Neurological: Negative for headaches, focal weakness or numbness. ____________________________________________  PHYSICAL EXAM:  VITAL SIGNS: ED Triage Vitals  Enc Vitals Group     BP 11/11/15 1843 (!) 195/79     Pulse Rate 11/11/15 1843 100     Resp 11/11/15 1843 14     Temp 11/11/15 1843 98.5 F (36.9 C)     Temp Source 11/11/15 1843 Oral     SpO2 11/11/15 1843 98 %     Weight 11/11/15 1844 120 lb (54.4 kg)     Height 11/11/15 1844 5\' 5"  (1.651 m)     Head Circumference --      Peak Flow --      Pain Score 11/11/15 1855 4     Pain Loc --      Pain Edu? --      Excl. in [place]? --     Constitutional: Alert and oriented. Well appearing and in no distress. Head: Normocephalic and atraumatic. Cardiovascular: Normal rate, regular rhythm. Normal distal pulses and capillary refill. Respiratory: Normal respiratory effort. No wheezes/rales/rhonchi. Gastrointestinal: Soft and nontender. No distention. Musculoskeletal: Normal composite fist bilaterally. Nontender with normal range of motion in all extremities.  Neurologic:  Normal gait without ataxia. Normal speech and language. No gross focal neurologic deficits are appreciated. Skin:  Skin is warm, dry and intact. No rash noted. Patient with a subtle hematoma to the dorsal aspect of the right hand. She is noted to have a single needle prick mark to the dorsal aspect of the hand as well. There is no local erythema, edema, induration, or increased warmth. Psychiatric: Mood is normal and affect is anxious. Patient exhibits appropriate insight and judgment. ____________________________________________   RADIOLOGY  RUE Ultrasound  IMPRESSION:  Dorsal hand subcutaneous swelling and hematoma. No deep or  ligamentous involvement noted.  ____________________________________________  INITIAL IMPRESSION / ASSESSMENT AND PLAN / ED  COURSE  Patient with an acute right dorsal hand hematoma status post IV needle stick. Patient is reassured following the ultrasound the right hand. She is discharged with instructions to apply cold compresses to the hand if and advise on the slow course of resolution. She will follow-up with primary care provider as needed.  Clinical Course   ____________________________________________  FINAL CLINICAL IMPRESSION(S) / ED DIAGNOSES  Final diagnoses:  Hematoma  Phlebitis after infusion, initial encounter  Hematoma of IV site, initial encounter      Melvenia Needles, PA-C 11/14/15 1901    Joanne Gavel, MD 11/15/15 [phone]

## 2015-11-11 NOTE — Discharge Instructions (Signed)
Your ultrasound reveals a superficial hematoma without phlebitis.  Apply ice compresses and follow-up with your provider as needed.

## 2015-11-20 DIAGNOSIS — H269 Unspecified cataract: Secondary | ICD-10-CM | POA: Insufficient documentation

## 2015-12-02 ENCOUNTER — Other Ambulatory Visit: Payer: Self-pay | Admitting: Internal Medicine

## 2015-12-02 ENCOUNTER — Ambulatory Visit: Payer: Medicare Other

## 2015-12-02 ENCOUNTER — Ambulatory Visit
Admission: RE | Admit: 2015-12-02 | Discharge: 2015-12-02 | Disposition: A | Discharge status: Home / Self Care | Payer: Medicare Other | Source: Ambulatory Visit | Attending: Internal Medicine | Admitting: Internal Medicine

## 2015-12-02 DIAGNOSIS — Z1231 Encounter for screening mammogram for malignant neoplasm of breast: Secondary | ICD-10-CM | POA: Diagnosis present

## 2016-01-27 DIAGNOSIS — R7989 Other specified abnormal findings of blood chemistry: Secondary | ICD-10-CM | POA: Insufficient documentation

## 2016-02-02 DIAGNOSIS — M47812 Spondylosis without myelopathy or radiculopathy, cervical region: Secondary | ICD-10-CM | POA: Insufficient documentation

## 2016-08-12 DIAGNOSIS — H532 Diplopia: Secondary | ICD-10-CM | POA: Insufficient documentation

## 2016-08-12 DIAGNOSIS — Z9842 Cataract extraction status, left eye: Secondary | ICD-10-CM

## 2016-08-12 DIAGNOSIS — Z961 Presence of intraocular lens: Secondary | ICD-10-CM | POA: Insufficient documentation

## 2016-09-28 DIAGNOSIS — D369 Benign neoplasm, unspecified site: Secondary | ICD-10-CM | POA: Insufficient documentation

## 2016-11-01 ENCOUNTER — Other Ambulatory Visit: Payer: Self-pay | Admitting: Internal Medicine

## 2016-11-01 DIAGNOSIS — Z1231 Encounter for screening mammogram for malignant neoplasm of breast: Secondary | ICD-10-CM

## 2016-11-07 ENCOUNTER — Emergency Department
Admission: EM | Admit: 2016-11-07 | Discharge: 2016-11-07 | Disposition: A | Discharge status: Home / Self Care | Payer: Medicare Other | Attending: Emergency Medicine | Admitting: Emergency Medicine

## 2016-11-07 DIAGNOSIS — Z5321 Procedure and treatment not carried out due to patient leaving prior to being seen by health care provider: Secondary | ICD-10-CM | POA: Diagnosis not present

## 2016-11-07 DIAGNOSIS — R112 Nausea with vomiting, unspecified: Secondary | ICD-10-CM | POA: Diagnosis present

## 2016-11-07 LAB — COMPREHENSIVE METABOLIC PANEL
ALBUMIN: 3.9 g/dL (ref 3.5–5.0)
ALK PHOS: 76 U/L (ref 38–126)
ALT: 17 U/L (ref 14–54)
AST: 30 U/L (ref 15–41)
Anion gap: 10 (ref 5–15)
BILIRUBIN TOTAL: 0.9 mg/dL (ref 0.3–1.2)
BUN: 6 mg/dL (ref 6–20)
CALCIUM: 9 mg/dL (ref 8.9–10.3)
CO2: 21 mmol/L — AB (ref 22–32)
Chloride: 104 mmol/L (ref 101–111)
Creatinine, Ser: 0.83 mg/dL (ref 0.44–1.00)
GFR calc Af Amer: >60 mL/min (ref >60)
GFR calc non Af Amer: >60 mL/min (ref >60)
GLUCOSE: 140 mg/dL — AB (ref 65–99)
Potassium: 3.6 mmol/L (ref 3.5–5.1)
Sodium: 135 mmol/L (ref 135–145)
TOTAL PROTEIN: 7.1 g/dL (ref 6.5–8.1)

## 2016-11-07 LAB — CBC
HEMATOCRIT: 39.5 % (ref 35.0–47.0)
Hemoglobin: 13.5 g/dL (ref 12.0–16.0)
MCH: 28.9 pg (ref 26.0–34.0)
MCHC: 34.3 g/dL (ref 32.0–36.0)
MCV: 84.3 fL (ref 80.0–100.0)
Platelets: 221 10*3/uL (ref 150–440)
RBC: 4.68 MIL/uL (ref 3.80–5.20)
RDW: 14.3 % (ref 11.5–14.5)
WBC: 12.3 10*3/uL — ABNORMAL HIGH (ref 3.6–11.0)

## 2016-11-07 LAB — LIPASE, BLOOD: Lipase: 24 U/L (ref 11–51)

## 2016-11-07 NOTE — ED Triage Notes (Signed)
Patient reports symptoms began yesterday - nausea, vomiting and diarrhea.  Concerned she might be dehydrated.

## 2016-11-07 NOTE — ED Notes (Signed)
Patient observed walking outside with spouse.

## 2016-11-07 NOTE — ED Notes (Signed)
Stopped in hall way taking a patient to treatment room by patient's husband asking how much longer.  Explained there were several other patients in front of her.

## 2016-11-08 ENCOUNTER — Telehealth: Payer: Self-pay | Admitting: Emergency Medicine

## 2016-11-08 NOTE — Telephone Encounter (Signed)
Called patient due to lwot to inquire about condition and follow up plans. Person who answered says pt is not available but will give her my number to call if needed.

## 2016-12-08 ENCOUNTER — Ambulatory Visit
Admission: RE | Admit: 2016-12-08 | Discharge: 2016-12-08 | Disposition: A | Discharge status: Home / Self Care | Payer: Medicare Other | Source: Ambulatory Visit | Attending: Internal Medicine | Admitting: Internal Medicine

## 2016-12-08 DIAGNOSIS — Z1231 Encounter for screening mammogram for malignant neoplasm of breast: Secondary | ICD-10-CM | POA: Diagnosis not present

## 2017-01-05 ENCOUNTER — Ambulatory Visit: Admission: RE | Admit: 2017-01-05 | Discharge: 2017-01-05 | Disposition: A | Discharge status: Home / Self Care

## 2017-01-05 ENCOUNTER — Ambulatory Visit
Admission: RE | Admit: 2017-01-05 | Discharge: 2017-01-05 | Disposition: A | Discharge status: Home / Self Care | Payer: MEDICARE | Attending: Gastroenterology | Admitting: Gastroenterology

## 2017-01-05 ENCOUNTER — Ambulatory Visit
Admission: RE | Admit: 2017-01-05 | Discharge: 2017-01-05 | Disposition: A | Discharge status: Home / Self Care | Admitting: Anesthesiology

## 2017-01-05 DIAGNOSIS — Z1211 Encounter for screening for malignant neoplasm of colon: Principal | ICD-10-CM

## 2017-03-03 ENCOUNTER — Ambulatory Visit
Admission: RE | Admit: 2017-03-03 | Discharge: 2017-03-03 | Disposition: A | Discharge status: Home / Self Care | Payer: MEDICARE

## 2017-03-03 ENCOUNTER — Ambulatory Visit
Admission: RE | Admit: 2017-03-03 | Discharge: 2017-03-03 | Disposition: A | Discharge status: Home / Self Care | Payer: MEDICARE | Attending: Registered" | Admitting: Registered"

## 2017-03-03 MED ORDER — SODIUM CHLORIDE 10 % FOR NEBULIZATION
Freq: Two times a day (BID) | RESPIRATORY_TRACT | 3 refills | 0 days | Status: CP
Start: 2017-03-03 — End: 2017-03-03

## 2017-03-03 MED ORDER — CYPROHEPTADINE 4 MG TABLET
ORAL_TABLET | Freq: Three times a day (TID) | ORAL | 0 refills | 0.00000 days | Status: CP
Start: 2017-03-03 — End: 2018-03-09

## 2017-03-03 MED ORDER — PARI LC D NEBULIZER MISC
3 refills | 0 days | Status: CP
Start: 2017-03-03 — End: ?

## 2017-03-03 MED ORDER — IVACAFTOR 150 MG TABLET: 150 mg | tablet | Freq: Two times a day (BID) | 11 refills | 0 days | Status: AC

## 2017-03-03 MED ORDER — IVACAFTOR 150 MG TABLET: tablet | 11 refills | 0 days

## 2017-03-03 MED ORDER — IVACAFTOR 150 MG TABLET
ORAL_TABLET | Freq: Two times a day (BID) | ORAL | 11 refills | 0.00000 days | Status: CP
Start: 2017-03-03 — End: 2017-03-03

## 2017-03-03 MED ORDER — SODIUM CHLORIDE 10 % FOR NEBULIZATION: mL | 3 refills | 0 days | Status: AC

## 2017-03-09 MED FILL — PARI LC PLUS NEBULIZER SET//MISC: PARI LC PLUS NEBULIZER SET//MISC | 3 days supply | Qty: 3 | Fill #0

## 2017-03-10 MED FILL — SODIUM CHLORIDE NEB 10%/NEBU10%/NEBU: SODIUM CHLORIDE NEB 10%/NEBU10%/NEBU | 75 days supply | Qty: 750 | Fill #0

## 2017-04-06 ENCOUNTER — Encounter
Admit: 2017-04-06 | Discharge: 2017-04-06 | Payer: MEDICARE | Attending: Student in an Organized Health Care Education/Training Program | Primary: Student in an Organized Health Care Education/Training Program

## 2017-04-06 ENCOUNTER — Ambulatory Visit: Admit: 2017-04-06 | Discharge: 2017-04-06 | Payer: MEDICARE

## 2017-04-06 DIAGNOSIS — Z1211 Encounter for screening for malignant neoplasm of colon: Principal | ICD-10-CM

## 2017-04-06 MED FILL — KALYDECO/150MG/TABS: KALYDECO/150MG/TABS | 28 days supply | Qty: 56 | Fill #0

## 2017-05-02 MED FILL — KALYDECO/150MG/TABS: KALYDECO/150MG/TABS | 28 days supply | Qty: 56 | Fill #1

## 2017-05-02 NOTE — Unmapped (Signed)
[facility] Refill Coordination Note  Specialty Medication(s): [name]  Additional Medications shipped: na    Eulas Post, DOB: [birth year]  Phone: [phone] (home) , Alternate phone contact: N/A  Phone or address changes today?: No  All above HIPAA information was verified with patient's family member.  Shipping Address: [address]  [place] [address]   Insurance changes? No    Completed refill call assessment today to schedule patient's medication shipment from the [facility] [phone]).      Confirmed the medication and dosage are correct and have not changed: Yes, regimen is correct and unchanged.    Confirmed patient started or stopped the following medications in the past month:  No, there are no changes reported at this time.    Are you tolerating your medication?:  [name] reports tolerating the medication.    ADHERENCE    Did you miss any doses in the past 4 weeks? No missed doses reported.    FINANCIAL/SHIPPING    Delivery Scheduled: Yes, Expected medication delivery date: Wed, Feb 13     [name] did not have any additional questions at this time.    Delivery address validated in [facility]SI scheduling system: Yes, address listed in FSI is correct.    We will follow up with patient monthly for standard refill processing and delivery.      Thank you,  Tawanna Solo [facility] Specialty Pharmacist

## 2017-05-13 ENCOUNTER — Other Ambulatory Visit: Payer: Self-pay | Admitting: Surgery

## 2017-05-13 DIAGNOSIS — M7582 Other shoulder lesions, left shoulder: Secondary | ICD-10-CM

## 2017-05-13 DIAGNOSIS — S42255A Nondisplaced fracture of greater tuberosity of left humerus, initial encounter for closed fracture: Secondary | ICD-10-CM

## 2017-05-21 ENCOUNTER — Ambulatory Visit: Payer: Medicare Other

## 2017-05-24 ENCOUNTER — Ambulatory Visit
Admission: RE | Admit: 2017-05-24 | Discharge: 2017-05-24 | Disposition: A | Discharge status: Home / Self Care | Payer: Medicare Other | Source: Ambulatory Visit | Attending: Surgery | Admitting: Surgery

## 2017-05-24 DIAGNOSIS — M7582 Other shoulder lesions, left shoulder: Secondary | ICD-10-CM

## 2017-05-24 DIAGNOSIS — X58XXXA Exposure to other specified factors, initial encounter: Secondary | ICD-10-CM | POA: Diagnosis not present

## 2017-05-24 DIAGNOSIS — M67412 Ganglion, left shoulder: Secondary | ICD-10-CM | POA: Diagnosis not present

## 2017-05-24 DIAGNOSIS — S42255A Nondisplaced fracture of greater tuberosity of left humerus, initial encounter for closed fracture: Secondary | ICD-10-CM | POA: Diagnosis not present

## 2017-05-24 DIAGNOSIS — M19012 Primary osteoarthritis, left shoulder: Secondary | ICD-10-CM | POA: Diagnosis not present

## 2017-05-27 NOTE — Unmapped (Signed)
[facility] Refill Coordination Note  Specialty Medication(s): Alyssa Willis, DOB: 1946-02-13  Phone: [phone] (home) , Alternate phone contact: N/A  Phone or address changes today?: No  All above HIPAA information was verified with patient's family member.  Shipping Address: [address]  [place] [address]   Insurance changes? No    Completed refill call assessment today to schedule patient's medication shipment from the [facility] [phone]).      Confirmed the medication and dosage are correct and have not changed: Yes, regimen is correct and unchanged.    Confirmed patient started or stopped the following medications in the past month:  No, there are no changes reported at this time.    Are you tolerating your medication?:  Alyssa Willis reports tolerating the medication.    ADHERENCE    (Below is required for Medicare Part B or Transplant patients only - per drug):   How many tablets were dispensed last month: 56  Patient currently has THROUGH 3/15 remaining.    Did you miss any doses in the past 4 weeks? No missed doses reported.    FINANCIAL/SHIPPING    Delivery Scheduled: Yes, Expected medication delivery date: 06/01/17, PER PT HUSBANDS REQUEST     The patient will receive an [facility]SI print out for each medication shipped and additional FDA Medication Guides as required.  Patient education from Alyssa Willis or Alyssa Willis may also be included in the shipment    [place] did not have any additional questions at this time.    Delivery address validated in FSI scheduling system: Yes, address listed in FSI is correct.    We will follow up with patient monthly for standard refill processing and delivery.      Thank you,  Alyssa Willis   [facility] Shared [facility] Specialty Technician

## 2017-05-31 MED FILL — KALYDECO/150MG/TABS: KALYDECO/150MG/TABS | 28 days supply | Qty: 56 | Fill #2

## 2017-06-24 NOTE — Unmapped (Signed)
[facility] Refill Coordination Note: Pulmonary         Eulas Post, DOB: [birth year]  Address: [address]  [place] [address]  All above HIPAA information was verified with patient's family member.     Medication Adherence    Patient Reported X Missed Doses in the Last Month:  0  Specialty Medication:  [name] 150MG  QOH 6 DAYS   Patient is on additional specialty medications:  No  Informant:  spouse  Scientist, clinical (histocompatibility and immunogenetics) for Adherence:  family member  Confirmed Plan for Next Specialty Medication Refill:  delivery by pharmacy  Refills Needed for Supportive Medications:  not needed  Medication Assistance Program  Medication Program Assistance Provided(If Applicable):  HWG ACTIVE   Refill Coordination  Has the Patients' Contact Information Changed:  No    Is the Shipping Address Different:  No    Shipping Information  Delivery Scheduled:  Yes  Delivery Date:  06/28/17  Medications to be Shipped:  [name]  (SIG REQUIRED)                -Confirmed the medication and dosage are correct and have not changed: Yes, regimen is correct and unchanged.  Is this medicine covered by Medicare Part B? No.           ADDITIONAL NOTES          SIG REQD          Questions for the pharmacist: No  Shipping address confirmed in FSI.       The patient will receive an FSI print out for each medication shipped and additional FDA Medication Guides as required.  Patient education from [place] or Robet Leu may also be included in the shipment.        Ledora Bottcher

## 2017-06-27 MED FILL — KALYDECO/150MG/TABS: KALYDECO/150MG/TABS | 28 days supply | Qty: 56 | Fill #3

## 2017-07-22 NOTE — Unmapped (Signed)
[facility] Refill Coordination Note  Specialty Medication(s): [facility] 150mg     Alyssa Willis, DOB: [birth year]  Phone: [phone] (home) , Alternate phone contact: N/A  Phone or address changes today?: No  All above HIPAA information was verified with patient's caregiver. (Husband)  Shipping Address: [address]  [place] [address]   Insurance changes? No    Completed refill call assessment today to schedule patient's medication shipment from the [facility] [phone]).      Confirmed the medication and dosage are correct and have not changed: Yes, regimen is correct and unchanged.    Confirmed patient started or stopped the following medications in the past month:  No, there are no changes reported at this time.    Are you tolerating your medication?:  Alyssa Willis reports tolerating the medication.    ADHERENCE  Did you miss any doses in the past 4 weeks? No missed doses reported.    FINANCIAL/SHIPPING    Delivery Scheduled: Yes, Expected medication delivery date: 07/26/2017     The patient will receive an FSI print out for each medication shipped and additional FDA Medication Guides as required.  Patient education from [facility] or Robet Leu may also be included in the shipment    [place] did not have any additional questions at this time.    Delivery address validated in FSI scheduling system: Yes, address listed in FSI is correct.    We will follow up with patient monthly for standard refill processing and delivery.      Thank you,  Alyssa Willis  Alyssa Willis   [facility] Specialty Pharmacist

## 2017-07-25 MED FILL — KALYDECO/150MG/TABS: KALYDECO/150MG/TABS | 28 days supply | Qty: 56 | Fill #4

## 2017-08-22 NOTE — Unmapped (Signed)
[name] called for delivery.   She has 6 days of medication on hand at this time    [facility] Refill Coordination Note    Specialty Medication(s) to be Shipped:   CF/Pulmonary: -[name] 150mg     Other medication(s) to be shipped: N/A     Alyssa Willis, DOB: [birth year]  Phone: [phone] (home)   Shipping Address: [address]  [place] [address]    All above HIPAA information was verified with patient's spouse     Completed refill call assessment today to schedule patient's medication shipment from the [facility] [phone]).       Specialty medication(s) and dose(s) confirmed: Regimen is correct and unchanged.   Changes to medications: Alyssa Willis reports no changes reported at this time.  Changes to insurance: No  Questions for the pharmacist: No    The patient will receive an FSI print out for each medication shipped and additional FDA Medication Guides as required.  Patient education from Alyssa Willis or Alyssa Willis may also be included in the shipment.    DISEASE-SPECIFIC INFORMATION        For CF patients: CF [facility] Active? No-not enrolled    ADHERENCE     Medication Adherence    Patient reported X missed doses in the last month:  0  Specialty Medication:  [facility]  Patient is on additional specialty medications:  No  Patient is on more than two specialty medications:  No  Any gaps in refill history greater than 2 weeks in the last 3 months:  no  Demonstrates understanding of importance of adherence:  yes  Informant:  spouse  Reliability of informant:  reliable  Support network for adherence:  family member  Confirmed plan for next specialty medication refill:  delivery by pharmacy  Refills needed for supportive medications:  not needed          Refill Coordination    Has the Patients' Contact Information Changed:  No  Is the Shipping Address Different:  No         MEDICARE PART B DOCUMENTATION     [name]: Patient has 12tablets  on hand.    SHIPPING Shipping address confirmed in FSI.     Delivery Scheduled: Yes, Expected medication delivery date: 08/24/17 via next day courier via UPS or courier.     Alyssa Willis   [facility] Specialty Technician

## 2017-08-23 MED FILL — KALYDECO/150MG/TABS: KALYDECO/150MG/TABS | 28 days supply | Qty: 56 | Fill #5

## 2017-09-05 ENCOUNTER — Other Ambulatory Visit: Payer: Self-pay | Admitting: Surgery

## 2017-09-05 DIAGNOSIS — S42255S Nondisplaced fracture of greater tuberosity of left humerus, sequela: Secondary | ICD-10-CM

## 2017-09-05 DIAGNOSIS — M7582 Other shoulder lesions, left shoulder: Secondary | ICD-10-CM

## 2017-09-06 ENCOUNTER — Ambulatory Visit
Admission: RE | Admit: 2017-09-06 | Discharge: 2017-09-06 | Disposition: A | Discharge status: Home / Self Care | Payer: Medicare Other | Source: Ambulatory Visit | Attending: Surgery | Admitting: Surgery

## 2017-09-06 DIAGNOSIS — S42255S Nondisplaced fracture of greater tuberosity of left humerus, sequela: Secondary | ICD-10-CM | POA: Insufficient documentation

## 2017-09-06 DIAGNOSIS — M7582 Other shoulder lesions, left shoulder: Secondary | ICD-10-CM

## 2017-09-06 DIAGNOSIS — X58XXXS Exposure to other specified factors, sequela: Secondary | ICD-10-CM | POA: Diagnosis not present

## 2017-09-09 ENCOUNTER — Encounter
Admission: RE | Admit: 2017-09-09 | Discharge: 2017-09-09 | Disposition: A | Discharge status: Home / Self Care | Payer: Medicare Other | Source: Ambulatory Visit | Attending: Surgery | Admitting: Surgery

## 2017-09-09 ENCOUNTER — Other Ambulatory Visit: Payer: Self-pay

## 2017-09-09 DIAGNOSIS — I10 Essential (primary) hypertension: Secondary | ICD-10-CM | POA: Diagnosis not present

## 2017-09-09 DIAGNOSIS — Z01818 Encounter for other preprocedural examination: Secondary | ICD-10-CM | POA: Diagnosis not present

## 2017-09-09 DIAGNOSIS — Z01810 Encounter for preprocedural cardiovascular examination: Secondary | ICD-10-CM | POA: Diagnosis present

## 2017-09-09 DIAGNOSIS — S42255S Nondisplaced fracture of greater tuberosity of left humerus, sequela: Secondary | ICD-10-CM | POA: Insufficient documentation

## 2017-09-09 DIAGNOSIS — X58XXXS Exposure to other specified factors, sequela: Secondary | ICD-10-CM | POA: Diagnosis not present

## 2017-09-09 HISTORY — DX: Gastro-esophageal reflux disease without esophagitis: K21.9

## 2017-09-09 HISTORY — DX: Pure hypercholesterolemia, unspecified: E78.00

## 2017-09-09 HISTORY — DX: Personal history of other diseases of the digestive system: Z87.19

## 2017-09-09 HISTORY — DX: Depression, unspecified: F32.A

## 2017-09-09 HISTORY — DX: Major depressive disorder, single episode, unspecified: F32.9

## 2017-09-09 LAB — CBC
HEMATOCRIT: 39.5 % (ref 35.0–47.0)
HEMOGLOBIN: 13.5 g/dL (ref 12.0–16.0)
MCH: 29.8 pg (ref 26.0–34.0)
MCHC: 34.1 g/dL (ref 32.0–36.0)
MCV: 87.4 fL (ref 80.0–100.0)
Platelets: 226 10*3/uL (ref 150–440)
RBC: 4.52 MIL/uL (ref 3.80–5.20)
RDW: 14.3 % (ref 11.5–14.5)
WBC: 5.7 10*3/uL (ref 3.6–11.0)

## 2017-09-09 LAB — TYPE AND SCREEN
ABO/RH(D): O POS
Antibody Screen: NEGATIVE

## 2017-09-09 LAB — BASIC METABOLIC PANEL
ANION GAP: 8 (ref 5–15)
BUN: 23 mg/dL — ABNORMAL HIGH (ref 6–20)
CALCIUM: 9.4 mg/dL (ref 8.9–10.3)
CO2: 26 mmol/L (ref 22–32)
Chloride: 106 mmol/L (ref 101–111)
Creatinine, Ser: 0.9 mg/dL (ref 0.44–1.00)
GFR calc Af Amer: >60 mL/min (ref >60)
GFR calc non Af Amer: >60 mL/min (ref >60)
GLUCOSE: 121 mg/dL — AB (ref 65–99)
POTASSIUM: 3.8 mmol/L (ref 3.5–5.1)
Sodium: 140 mmol/L (ref 135–145)

## 2017-09-09 NOTE — Patient Instructions (Signed)
Your procedure is scheduled on: Thursday, September 15, 2017 Report to Day Surgery on the 2nd floor of the [facility]. To find out your arrival time, please call [phone] between 1PM - 3PM on: Wednesday, September 14, 2017  REMEMBER: Instructions that are not followed completely may result in serious medical risk, up to and including death; or upon the discretion of your surgeon and anesthesiologist your surgery may need to be rescheduled.  Do not eat food after midnight the night before your procedure.  No gum chewing, lozengers or hard candies.  You may however, drink CLEAR liquids up to 2 hours before you are scheduled to arrive for your surgery. Do not drink anything within 2 hours of the start of your surgery.  Clear liquids include: - water  - apple juice without pulp - clear gatorade - black coffee or tea (Do NOT add anything to the coffee or tea) Do NOT drink anything that is not on this list.  No Alcohol for 24 hours before or after surgery.  No Smoking including e-cigarettes for 24 hours prior to surgery.  No chewable tobacco products for at least 6 hours prior to surgery.  No nicotine patches on the day of surgery.  On the morning of surgery brush your teeth with toothpaste and water, you may rinse your mouth with mouthwash if you wish. Do not swallow any toothpaste or mouthwash.  Notify your doctor if there is any change in your medical condition (cold, fever, infection).  Do not wear jewelry, make-up, hairpins, clips or nail polish.  Do not wear lotions, powders, or perfumes. You may wear deodorant.  Do not shave 48 hours prior to surgery.   Contacts and dentures may not be worn into surgery.  Do not bring valuables to the hospital, including drivers license, insurance or credit cards.  [facility] is not responsible for any belongings or valuables.   TAKE THESE MEDICATIONS THE MORNING OF SURGERY:  1.  kalydeco 2.  Metoprolol 3.  Ranitidine 4.  Sodium chloride  nebulizer  Use CHG Soap as directed on instruction sheet.  NOW!  Stop Anti-inflammatories (NSAIDS) such as Advil, Aleve, Ibuprofen, Motrin, Naproxen, Naprosyn and Aspirin based products such as Excedrin, Goodys Powder, BC Powder. (May take Tylenol or Acetaminophen if needed.)  NOW!  Stop ANY OVER THE COUNTER supplements until after surgery. (CALCIUM CITRATE, FISH OIL) (May continue Vitamin D and multivitamin.)  Wear comfortable clothing (specific to your surgery type) to the hospital.  Plan for stool softeners for home use.  If you are being discharged the day of surgery, you will not be allowed to drive home. You will need a responsible adult to drive you home and stay with you that night.   If you are taking public transportation, you will need to have a responsible adult with you. Please confirm with your physician that it is acceptable to use public transportation.   Please call [phone] if you have any questions about these instructions.

## 2017-09-14 NOTE — Unmapped (Signed)
[facility] Refill Coordination Note    Specialty Medication(s) to be Shipped:   CF/Pulmonary: -[name] 150mg     Other medication(s) to be shipped:       Alyssa Willis, DOB: Jun 28, 1945  Phone: [phone] (home)   Shipping Address: [address]  [place] [address]    All above HIPAA information was verified with patient's family member.     Completed refill call assessment today to schedule patient's medication shipment from the [facility] [phone]).       Specialty medication(s) and dose(s) confirmed: Regimen is correct and unchanged.   Changes to medications: Alyssa Willis reports no changes reported at this time.  Changes to insurance: No  Questions for the pharmacist: No    The patient will receive an FSI print out for each medication shipped and additional FDA Medication Guides as required.  Patient education from Alyssa Willis or Alyssa Willis may also be included in the shipment.    DISEASE-SPECIFIC INFORMATION        N/A    ADHERENCE     Medication Adherence    Support network for adherence:  family member              MEDICARE PART B DOCUMENTATION         SHIPPING     Shipping address confirmed in FSI.     Delivery Scheduled: Yes, Expected medication delivery date: [id number] [facility] via UPS or courier.     Alyssa Willis   [facility] Specialty Willis

## 2017-09-15 ENCOUNTER — Encounter
Admission: RE | Disposition: A | Discharge status: Home / Self Care | Payer: Self-pay | Source: Ambulatory Visit | Attending: Surgery

## 2017-09-15 ENCOUNTER — Ambulatory Visit: Payer: Medicare Other | Admitting: Certified Registered"

## 2017-09-15 ENCOUNTER — Encounter: Payer: Self-pay | Admitting: *Deleted

## 2017-09-15 ENCOUNTER — Ambulatory Visit: Payer: Medicare Other

## 2017-09-15 ENCOUNTER — Ambulatory Visit
Admission: RE | Admit: 2017-09-15 | Discharge: 2017-09-15 | Disposition: A | Discharge status: Home / Self Care | Payer: Medicare Other | Source: Ambulatory Visit | Attending: Surgery | Admitting: Surgery

## 2017-09-15 DIAGNOSIS — M94212 Chondromalacia, left shoulder: Secondary | ICD-10-CM | POA: Insufficient documentation

## 2017-09-15 DIAGNOSIS — W19XXXA Unspecified fall, initial encounter: Secondary | ICD-10-CM | POA: Insufficient documentation

## 2017-09-15 DIAGNOSIS — S46012A Strain of muscle(s) and tendon(s) of the rotator cuff of left shoulder, initial encounter: Secondary | ICD-10-CM | POA: Diagnosis present

## 2017-09-15 DIAGNOSIS — Z885 Allergy status to narcotic agent status: Secondary | ICD-10-CM | POA: Insufficient documentation

## 2017-09-15 DIAGNOSIS — K219 Gastro-esophageal reflux disease without esophagitis: Secondary | ICD-10-CM | POA: Insufficient documentation

## 2017-09-15 DIAGNOSIS — M65812 Other synovitis and tenosynovitis, left shoulder: Secondary | ICD-10-CM | POA: Insufficient documentation

## 2017-09-15 DIAGNOSIS — Z79899 Other long term (current) drug therapy: Secondary | ICD-10-CM | POA: Diagnosis not present

## 2017-09-15 DIAGNOSIS — I10 Essential (primary) hypertension: Secondary | ICD-10-CM | POA: Diagnosis not present

## 2017-09-15 DIAGNOSIS — Z419 Encounter for procedure for purposes other than remedying health state, unspecified: Secondary | ICD-10-CM

## 2017-09-15 DIAGNOSIS — E785 Hyperlipidemia, unspecified: Secondary | ICD-10-CM | POA: Insufficient documentation

## 2017-09-15 DIAGNOSIS — Z888 Allergy status to other drugs, medicaments and biological substances status: Secondary | ICD-10-CM | POA: Insufficient documentation

## 2017-09-15 DIAGNOSIS — F329 Major depressive disorder, single episode, unspecified: Secondary | ICD-10-CM | POA: Diagnosis not present

## 2017-09-15 HISTORY — PX: SHOULDER ARTHROSCOPY WITH OPEN ROTATOR CUFF REPAIR: SHX6092

## 2017-09-15 LAB — ABO/RH: ABO/RH(D): O POS

## 2017-09-15 SURGERY — ARTHROSCOPY, SHOULDER WITH REPAIR, ROTATOR CUFF, OPEN
Anesthesia: General | Site: Shoulder | Laterality: Left | Wound class: Clean

## 2017-09-15 MED ORDER — LACTATED RINGERS IV SOLN
INTRAVENOUS | Status: DC
  Administered 2017-09-15: 09:00:00 via INTRAVENOUS
  Administered 2017-09-15: 75 mL/h via INTRAVENOUS

## 2017-09-15 MED ORDER — SUGAMMADEX SODIUM 200 MG/2ML IV SOLN
INTRAVENOUS | Status: DC | PRN
  Administered 2017-09-15: 200 mg via INTRAVENOUS

## 2017-09-15 MED ORDER — PHENYLEPHRINE HCL 10 MG/ML IJ SOLN
INTRAMUSCULAR | Status: AC
  Filled 2017-09-15: qty 1

## 2017-09-15 MED ORDER — BUPIVACAINE HCL (PF) 0.5 % IJ SOLN
INTRAMUSCULAR | Status: AC
  Filled 2017-09-15: qty 30

## 2017-09-15 MED ORDER — FENTANYL CITRATE (PF) 100 MCG/2ML IJ SOLN: 25.0000 ug | INTRAMUSCULAR | Status: DC | PRN

## 2017-09-15 MED ORDER — ROCURONIUM BROMIDE 50 MG/5ML IV SOLN
INTRAVENOUS | Status: AC
Start: 2017-09-15 — End: ?
  Filled 2017-09-15: qty 1

## 2017-09-15 MED ORDER — ONDANSETRON HCL 4 MG/2ML IJ SOLN
INTRAMUSCULAR | Status: DC | PRN
  Administered 2017-09-15: 4 mg via INTRAVENOUS

## 2017-09-15 MED ORDER — FENTANYL CITRATE (PF) 100 MCG/2ML IJ SOLN
INTRAMUSCULAR | Status: AC
  Filled 2017-09-15: qty 2

## 2017-09-15 MED ORDER — DEXAMETHASONE SODIUM PHOSPHATE 10 MG/ML IJ SOLN
INTRAMUSCULAR | Status: AC
  Filled 2017-09-15: qty 1

## 2017-09-15 MED ORDER — CEFAZOLIN SODIUM-DEXTROSE 2-4 GM/100ML-% IV SOLN
INTRAVENOUS | Status: AC
  Filled 2017-09-15: qty 100

## 2017-09-15 MED ORDER — METOCLOPRAMIDE HCL 10 MG PO TABS: 5.0000 mg | ORAL_TABLET | Freq: Three times a day (TID) | ORAL | Status: DC | PRN

## 2017-09-15 MED ORDER — HYDROMORPHONE HCL 1 MG/ML IJ SOLN
INTRAMUSCULAR | Status: DC | PRN
  Administered 2017-09-15 (×2): 0.5 mg via INTRAVENOUS

## 2017-09-15 MED ORDER — ONDANSETRON HCL 4 MG PO TABS: 4.0000 mg | ORAL_TABLET | Freq: Four times a day (QID) | ORAL | Status: DC | PRN

## 2017-09-15 MED ORDER — PROPOFOL 500 MG/50ML IV EMUL
INTRAVENOUS | Status: AC
  Filled 2017-09-15: qty 50

## 2017-09-15 MED ORDER — LIDOCAINE HCL (PF) 2 % IJ SOLN
INTRAMUSCULAR | Status: AC
  Filled 2017-09-15: qty 10

## 2017-09-15 MED ORDER — FENTANYL CITRATE (PF) 250 MCG/5ML IJ SOLN
INTRAMUSCULAR | Status: AC
Start: 2017-09-15 — End: ?
  Filled 2017-09-15: qty 5

## 2017-09-15 MED ORDER — PROPOFOL 10 MG/ML IV BOLUS
INTRAVENOUS | Status: DC | PRN
  Administered 2017-09-15: 130 mg via INTRAVENOUS
  Administered 2017-09-15: 70 mg via INTRAVENOUS

## 2017-09-15 MED ORDER — EPINEPHRINE PF 1 MG/ML IJ SOLN
INTRAMUSCULAR | Status: AC
  Filled 2017-09-15: qty 2

## 2017-09-15 MED ORDER — OXYCODONE HCL 5 MG PO TABS: 5.0000 mg | ORAL_TABLET | ORAL | 0 refills | Status: DC | PRN

## 2017-09-15 MED ORDER — KETOROLAC TROMETHAMINE 30 MG/ML IJ SOLN
INTRAMUSCULAR | Status: AC
  Filled 2017-09-15: qty 1

## 2017-09-15 MED ORDER — PHENYLEPHRINE HCL 10 MG/ML IJ SOLN
INTRAMUSCULAR | Status: DC | PRN
  Administered 2017-09-15: 200 ug via INTRAVENOUS
  Administered 2017-09-15: 100 ug via INTRAVENOUS

## 2017-09-15 MED ORDER — PROPOFOL 10 MG/ML IV BOLUS
INTRAVENOUS | Status: AC
  Filled 2017-09-15: qty 20

## 2017-09-15 MED ORDER — MIDAZOLAM HCL 2 MG/2ML IJ SOLN
INTRAMUSCULAR | Status: AC
  Filled 2017-09-15: qty 2

## 2017-09-15 MED ORDER — ROCURONIUM BROMIDE 100 MG/10ML IV SOLN
INTRAVENOUS | Status: DC | PRN
  Administered 2017-09-15: 50 mg via INTRAVENOUS

## 2017-09-15 MED ORDER — KETAMINE HCL 10 MG/ML IJ SOLN
INTRAMUSCULAR | Status: DC | PRN
  Administered 2017-09-15 (×2): 25 mg via INTRAVENOUS

## 2017-09-15 MED ORDER — ONDANSETRON HCL 4 MG/2ML IJ SOLN: 4.0000 mg | Freq: Four times a day (QID) | INTRAMUSCULAR | Status: DC | PRN

## 2017-09-15 MED ORDER — ACETAMINOPHEN 10 MG/ML IV SOLN
INTRAVENOUS | Status: AC
  Filled 2017-09-15: qty 100

## 2017-09-15 MED ORDER — METOCLOPRAMIDE HCL 5 MG/ML IJ SOLN: 5.0000 mg | Freq: Three times a day (TID) | INTRAMUSCULAR | Status: DC | PRN

## 2017-09-15 MED ORDER — ONDANSETRON HCL 4 MG/2ML IJ SOLN
INTRAMUSCULAR | Status: AC
  Filled 2017-09-15: qty 2

## 2017-09-15 MED ORDER — GLYCOPYRROLATE 0.2 MG/ML IJ SOLN
INTRAMUSCULAR | Status: AC
  Filled 2017-09-15: qty 1

## 2017-09-15 MED ORDER — KETOROLAC TROMETHAMINE 30 MG/ML IJ SOLN
INTRAMUSCULAR | Status: DC | PRN
  Administered 2017-09-15: 15 mg via INTRAVENOUS

## 2017-09-15 MED ORDER — HYDROMORPHONE HCL 1 MG/ML IJ SOLN
INTRAMUSCULAR | Status: AC
  Filled 2017-09-15: qty 1

## 2017-09-15 MED ORDER — OXYCODONE HCL 5 MG PO TABS
5.0000 mg | ORAL_TABLET | ORAL | Status: DC | PRN
  Filled 2017-09-15: qty 2

## 2017-09-15 MED ORDER — BUPIVACAINE-EPINEPHRINE (PF) 0.5% -1:200000 IJ SOLN
INTRAMUSCULAR | Status: DC | PRN
  Administered 2017-09-15: 30 mL via PERINEURAL

## 2017-09-15 MED ORDER — SUGAMMADEX SODIUM 200 MG/2ML IV SOLN
INTRAVENOUS | Status: AC
  Filled 2017-09-15: qty 2

## 2017-09-15 MED ORDER — GLYCOPYRROLATE 0.2 MG/ML IJ SOLN
INTRAMUSCULAR | Status: DC | PRN
  Administered 2017-09-15 (×2): 0.1 mg via INTRAVENOUS

## 2017-09-15 MED ORDER — FENTANYL CITRATE (PF) 100 MCG/2ML IJ SOLN
INTRAMUSCULAR | Status: DC | PRN
  Administered 2017-09-15: 50 ug via INTRAVENOUS
  Administered 2017-09-15 (×2): 100 ug via INTRAVENOUS

## 2017-09-15 MED ORDER — CEFAZOLIN SODIUM-DEXTROSE 2-4 GM/100ML-% IV SOLN
2.0000 g | Freq: Once | INTRAVENOUS | Status: AC
Start: 2017-09-15 — End: 2017-09-15
  Administered 2017-09-15: 2 g via INTRAVENOUS

## 2017-09-15 MED ORDER — EPINEPHRINE PF 1 MG/ML IJ SOLN
INTRAMUSCULAR | Status: AC
Start: 2017-09-15 — End: ?
  Filled 2017-09-15: qty 1

## 2017-09-15 MED ORDER — POTASSIUM CHLORIDE IN NACL 20-0.9 MEQ/L-% IV SOLN
INTRAVENOUS | Status: DC
  Filled 2017-09-15 (×3): qty 1000

## 2017-09-15 MED ORDER — MIDAZOLAM HCL 2 MG/2ML IJ SOLN
INTRAMUSCULAR | Status: DC | PRN
  Administered 2017-09-15: 2 mg via INTRAVENOUS

## 2017-09-15 MED ORDER — KETAMINE HCL 50 MG/ML IJ SOLN
INTRAMUSCULAR | Status: AC
  Filled 2017-09-15: qty 10

## 2017-09-15 MED ORDER — NEOMYCIN-POLYMYXIN B GU 40-200000 IR SOLN
Status: AC
  Filled 2017-09-15: qty 4

## 2017-09-15 MED ORDER — ONDANSETRON HCL 4 MG/2ML IJ SOLN: 4.0000 mg | Freq: Once | INTRAMUSCULAR | Status: DC | PRN

## 2017-09-15 MED ORDER — DEXAMETHASONE SODIUM PHOSPHATE 10 MG/ML IJ SOLN
INTRAMUSCULAR | Status: DC | PRN
  Administered 2017-09-15: 10 mg via INTRAVENOUS

## 2017-09-15 MED ORDER — LIDOCAINE HCL (CARDIAC) PF 100 MG/5ML IV SOSY
PREFILLED_SYRINGE | INTRAVENOUS | Status: DC | PRN
  Administered 2017-09-15: 50 mg via INTRAVENOUS

## 2017-09-15 MED ORDER — SODIUM CHLORIDE 0.9 % IV SOLN
INTRAVENOUS | Status: DC | PRN
  Administered 2017-09-15: 50 ug/min via INTRAVENOUS

## 2017-09-15 MED ORDER — ACETAMINOPHEN 10 MG/ML IV SOLN
INTRAVENOUS | Status: DC | PRN
  Administered 2017-09-15: 1000 mg via INTRAVENOUS

## 2017-09-15 SURGICAL SUPPLY — 67 items
ANCHOR JUGGERKNOT WTAP NDL 2.9 (Anchor) ×8 IMPLANT
ANCHOR TENDON REGENETEN (Staple) ×4 IMPLANT
ANCHORS BONE REGENETEN (Anchor) ×4 IMPLANT
BANDAGE ACE 4X5 VEL STRL LF (GAUZE/BANDAGES/DRESSINGS) IMPLANT
BIT DRILL JUGRKNT W/NDL BIT2.9 (DRILL) ×2 IMPLANT
BLADE FULL RADIUS 3.5 (BLADE) IMPLANT
BNDG COHESIVE 4X5 TAN STRL (GAUZE/BANDAGES/DRESSINGS) ×4 IMPLANT
BUR ACROMIONIZER 4.0 (BURR) ×8 IMPLANT
CANISTER SUCT 1200ML W/VALVE (MISCELLANEOUS) ×4 IMPLANT
CANNULA SHAVER 8MMX76MM (CANNULA) ×4 IMPLANT
CHLORAPREP W/TINT 26ML (MISCELLANEOUS) ×8 IMPLANT
COVER MAYO STAND STRL (DRAPES) ×4 IMPLANT
DRAPE C-ARM XRAY 36X54 (DRAPES) ×4 IMPLANT
DRAPE IMP U-DRAPE 54X76 (DRAPES) ×8 IMPLANT
DRAPE INCISE IOBAN 66X45 STRL (DRAPES) IMPLANT
DRAPE U-SHAPE 47X51 STRL (DRAPES) ×4 IMPLANT
DRILL JUGGERKNOT W/NDL BIT 2.9 (DRILL) ×4
DRSG OPSITE POSTOP 4X10 (GAUZE/BANDAGES/DRESSINGS) IMPLANT
DRSG OPSITE POSTOP 4X8 (GAUZE/BANDAGES/DRESSINGS) IMPLANT
ELECT CAUTERY BLADE 6.4 (BLADE) IMPLANT
ELECT REM PT RETURN 9FT ADLT (ELECTROSURGICAL) ×8
ELECTRODE REM PT RTRN 9FT ADLT (ELECTROSURGICAL) ×4 IMPLANT
GAUZE PETRO XEROFOAM 1X8 (MISCELLANEOUS) ×8 IMPLANT
GAUZE SPONGE 4X4 12PLY STRL (GAUZE/BANDAGES/DRESSINGS) ×8 IMPLANT
GLOVE BIO SURGEON STRL SZ7.5 (GLOVE) ×16 IMPLANT
GLOVE BIO SURGEON STRL SZ8 (GLOVE) ×16 IMPLANT
GLOVE BIOGEL PI IND STRL 8 (GLOVE) ×4 IMPLANT
GLOVE BIOGEL PI INDICATOR 8 (GLOVE) ×4
GLOVE INDICATOR 8.0 STRL GRN (GLOVE) ×8 IMPLANT
GOWN STRL REUS W/ TWL LRG LVL3 (GOWN DISPOSABLE) ×6 IMPLANT
GOWN STRL REUS W/ TWL XL LVL3 (GOWN DISPOSABLE) ×4 IMPLANT
GOWN STRL REUS W/TWL LRG LVL3 (GOWN DISPOSABLE) ×6
GOWN STRL REUS W/TWL XL LVL3 (GOWN DISPOSABLE) ×4
GRASPER SUT 15 45D LOW PRO (SUTURE) IMPLANT
HANDLE YANKAUER SUCT BULB TIP (MISCELLANEOUS) IMPLANT
IMPLANT REGENETEN MEDIUM (Shoulder) ×4 IMPLANT
IV LACTATED RINGER IRRG 3000ML (IV SOLUTION) ×4
IV LR IRRIG 3000ML ARTHROMATIC (IV SOLUTION) ×4 IMPLANT
KIT STABILIZATION SHOULDER (MISCELLANEOUS) IMPLANT
KIT TURNOVER KIT A (KITS) ×4 IMPLANT
MANIFOLD NEPTUNE II (INSTRUMENTS) ×4 IMPLANT
MASK FACE SPIDER DISP (MASK) ×8 IMPLANT
MAT BLUE FLOOR 46X72 FLO (MISCELLANEOUS) ×8 IMPLANT
NEEDLE HYPO 25X1 1.5 SAFETY (NEEDLE) ×4 IMPLANT
NEEDLE MAYO 6 CRC TAPER PT (NEEDLE) IMPLANT
NS IRRIG 1000ML POUR BTL (IV SOLUTION) ×4 IMPLANT
PACK ARTHROSCOPY SHOULDER (MISCELLANEOUS) ×8 IMPLANT
PAD CAST CTTN 4X4 STRL (SOFTGOODS) IMPLANT
PADDING CAST COTTON 4X4 STRL (SOFTGOODS)
SLING ARM LRG DEEP (SOFTGOODS) ×4 IMPLANT
SLING ULTRA II LG (MISCELLANEOUS) ×12 IMPLANT
SLING ULTRA II M (MISCELLANEOUS) ×4 IMPLANT
STAPLER SKIN PROX 35W (STAPLE) ×4 IMPLANT
STOCKINETTE IMPERVIOUS 9X36 MD (GAUZE/BANDAGES/DRESSINGS) IMPLANT
STRAP SAFETY 5IN WIDE (MISCELLANEOUS) ×4 IMPLANT
SUT ETHIBOND 0 MO6 C/R (SUTURE) ×8 IMPLANT
SUT FIBERWIRE #2 38 BLUE 1/2 (SUTURE) ×16
SUT PROLENE 4 0 PS 2 18 (SUTURE) ×8 IMPLANT
SUT VIC AB 2-0 CT1 27 (SUTURE) ×10
SUT VIC AB 2-0 CT1 TAPERPNT 27 (SUTURE) ×10 IMPLANT
SUTURE FIBERWR #2 38 BLUE 1/2 (SUTURE) ×8 IMPLANT
SYR 10ML LL (SYRINGE) ×4 IMPLANT
TAPE MICROFOAM 4IN (TAPE) ×4 IMPLANT
TUBING ARTHRO INFLOW-ONLY STRL (TUBING) ×8 IMPLANT
TUBING CONNECTING 10 (TUBING) ×3 IMPLANT
TUBING CONNECTING 10' (TUBING) ×1
WAND HAND CNTRL MULTIVAC 90 (MISCELLANEOUS) ×8 IMPLANT

## 2017-09-15 NOTE — Op Note (Signed)
09/15/2017  9:59 AM  Patient:   Brenda Clarke  Pre-Op Diagnosis:   Delayed union of nondisplaced greater tuberosity fracture with partial-thickness rotator cuff tear, left shoulder.  Post-Op Diagnosis: Same with biceps tendinopathy, left shoulder.  Procedure: Limited arthroscopic debridement, arthroscopic subacromial decompression, mini-open rotator cuff repair with Tamala Julian & Nephew Regeneten patch, and mini-open biceps tenodesis, left shoulder.  Anesthesia: GET  Surgeon:   Pascal Lux, MD  Assistant:   None  Findings: As above. The fracture appeared to have fully healed, so no formal repair was deemed necessary. There was a partial-thickness tear involving the articular anterior insertional fibers of the supraspinatus tendon (approximately 40-50%). The remainder of the rotator cuff was in excellent condition. There was moderate induration of the biceps tendon without partial or full-thickness tearing. The labrum demonstrated mild fraying superiorly as well as significant induration. There were grade 1 chondromalacial changes of the anterior superior aspect of the humeral head. Otherwise, the glenoid and humeral articular surfaces were in satisfactory condition.  Complications: None  Fluids:   1000 cc  Estimated blood loss: 5 cc  Tourniquet time: None  Drains: None  Closure: Staples   Brief clinical note: The patient is a 72 year old female with a 4 to 14-month history of left shoulder pain following a fall.  Initial radiographs and subsequent MRI scanning confirmed the presence of a nondisplaced greater tuberosity fracture which was felt to be best treated nonsurgically. The patient's symptoms have progressed despite medications, activity modification, etc. Repeat MRI scanning demonstrated adequate healing of the greater tuberosity fracture but did demonstrate a partial-thickness tear of the anterior insertional fibers of the supraspinatus tendon measuring  approximately 50% of the thickness of the tendon. The patient presents at this time for definitive management of her shoulder symptoms.  Procedure: The patient was brought into the operating room and lain in the supine position. The patient then underwent general endotracheal intubation and anesthesia before being repositioned in the beach chair position using the beach chair positioner. The left shoulder and upper extremity were prepped with ChloraPrep solution before being draped sterilely. Preoperative antibiotics were administered. A timeout was performed to confirm the proper surgical site before the expected portal sites and incision site were injected with 0.5% Sensorcaine with epinephrine. A posterior portal was created and the glenohumeral joint thoroughly inspected with the findings as described above. An anterior portal was created using an outside-in technique. The labrum and rotator cuff were further probed, again confirming the above-noted findings. The areas of labral fraying and synovitis were debrided back to stable margins using the full-radius resector. The torn portion of the articular surface of the anterior insertional fibers of the supraspinatus tendon also were debrided back to stable margins using the full-radius resector. The ArthroCare wand was inserted and used to release the biceps tendon from its labral anchor, as well as to obtain hemostasis and to "anneal" the labrum superiorly and anteriorly. The instruments were removed from the joint after suctioning the excess fluid.  The camera was repositioned through the posterior portal into the subacromial space. A separate lateral portal was created using an outside-in technique. The 3.5 mm full-radius resector was introduced and used to perform a subtotal bursectomy. The ArthroCare wand was then inserted and used to remove the periosteal tissue off the undersurface of the anterior third of the acromion as well as to recess the  coracoacromial ligament from its attachment along the anterior and lateral margins of the acromion. The 4.0 mm acromionizing bur was  introduced and used to complete the decompression by removing the undersurface of the anterior third of the acromion. The full radius resector was reintroduced to remove any residual bony debris before the ArthroCare wand was reintroduced to obtain hemostasis. The instruments were then removed from the subacromial space after suctioning the excess fluid.  An approximately 4-5 cm incision was made over the anterolateral aspect of the shoulder beginning at the anterolateral corner of the acromion and extending distally in line with the bicipital groove. This incision was carried down through the subcutaneous tissues to expose the deltoid fascia. The raphae between the anterior and middle thirds was identified and this plane developed to provide access into the subacromial space. Additional bursal tissues were debrided sharply using Metzenbaum scissors. The location of the partial-thickness rotator cuff tear was identified by palpation. The intact fibers were incised along the lines of the fibers with a #15 blade and the exposed greater tuberosity roughened with a rongeur. The tear was repaired using a single Biomet 2.9 mm JuggerKnot anchor.  The longitudinal incision into the rotator cuff was repaired using #0 Ethibond interrupted sutures placed in a side-to-side fashion.  A [place] patch was then applied over the repair site in order to optimize healing. An apparent watertight closure was obtained.  The bicipital groove was identified by palpation and opened for 1-1.5 cm. The biceps tendon stump was retrieved through this defect. The floor of the bicipital groove was roughened with a curet before another Biomet 2.9 mm JuggerKnot anchor was inserted. Both sets of sutures were passed through the biceps tendon and tied securely to effect the tenodesis. The bicipital  sheath was reapproximated using two #0 Ethibond interrupted sutures, incorporating the biceps tendon to further reinforce the tenodesis.  The wound was copiously irrigated with sterile saline solution before the deltoid raphae was reapproximated using 2-0 Vicryl interrupted sutures. The subcutaneous tissues were closed in two layers using 2-0 Vicryl interrupted sutures before the skin was closed using staples. The portal sites also were closed using staples. A sterile bulky dressing was applied to the shoulder before the arm was placed into a shoulder immobilizer. The patient was then awakened, extubated, and returned to the recovery room in satisfactory condition after tolerating the procedure well.

## 2017-09-15 NOTE — Anesthesia Postprocedure Evaluation (Signed)
Anesthesia Post Note  Patient: Brenda Clarke  Procedure(s) Performed: SHOULDER ARTHROSCOPY WITH OPEN ROTATOR CUFF REPAIR (Left Shoulder)  Patient location during evaluation: PACU Anesthesia Type: General Level of consciousness: awake and alert Pain management: pain level controlled Vital Signs Assessment: post-procedure vital signs reviewed and stable Respiratory status: spontaneous breathing and respiratory function stable Cardiovascular status: stable Anesthetic complications: no     Last Vitals:  Vitals:   09/15/17 0621 09/15/17 0947  BP: (!) 172/76 (!) 150/88  Pulse: (!) 58 87  Resp: 15 11  Temp: (!) 36.1 C (!) 36 C  SpO2: 100% 96%    Last Pain:  Vitals:   09/15/17 0947  TempSrc:   PainSc: Asleep                 KEPHART,WILLIAM K

## 2017-09-15 NOTE — Anesthesia Procedure Notes (Signed)
Procedure Name: Intubation Date/Time: 09/15/2017 7:42 AM Performed by: Nile Riggs, CRNA Pre-anesthesia Checklist: Patient identified, Emergency Drugs available, Suction available, Patient being monitored and Timeout performed Patient Re-evaluated:Patient Re-evaluated prior to induction Oxygen Delivery Method: Circle system utilized Preoxygenation: Pre-oxygenation with 100% oxygen Induction Type: IV induction Ventilation: Mask ventilation without difficulty Laryngoscope Size: Miller and 2 Grade View: Grade I Tube type: Oral Tube size: 7.0 mm Number of attempts: 1 Airway Equipment and Method: Stylet Placement Confirmation: ETT inserted through vocal cords under direct vision,  positive ETCO2,  CO2 detector and breath sounds checked- equal and bilateral Secured at: 21 cm Tube secured with: Tape Dental Injury: Teeth and Oropharynx as per pre-operative assessment

## 2017-09-15 NOTE — Discharge Instructions (Addendum)
Orthopedic discharge instructions: Keep dressing dry and intact.  May shower after dressing changed on post-op day #4 (Monday).  Cover staples with Band-Aids after drying off. Apply ice frequently to shoulder. Take ibuprofen 600 mg TID with meals for 7-10 days, then as necessary. Take oxycodone as prescribed when needed.  May supplement with ES Tylenol if necessary. Keep shoulder immobilizer on at all times except may remove for bathing purposes. Follow-up in 10-14 days or as scheduled.    AMBULATORY SURGERY  DISCHARGE INSTRUCTIONS   1) The drugs that you were given will stay in your system until tomorrow so for the next 24 hours you should not:  A) Drive an automobile B) Make any legal decisions C) Drink any alcoholic beverage   2) You may resume regular meals tomorrow.  Today it is better to start with liquids and gradually work up to solid foods.  You may eat anything you prefer, but it is better to start with liquids, then soup and crackers, and gradually work up to solid foods.   3) Please notify your doctor immediately if you have any unusual bleeding, trouble breathing, redness and pain at the surgery site, drainage, fever, or pain not relieved by medication.    4) Additional Instructions:        Please contact your physician with any problems or Same Day Surgery at [phone], Monday through Friday 6 am to 4 pm, or [place] at [facility] number at [phone].

## 2017-09-15 NOTE — Anesthesia Preprocedure Evaluation (Signed)
Anesthesia Evaluation  Patient identified by MRN, date of birth, ID band Patient awake    Reviewed: Allergy & Precautions, NPO status , Patient's Chart, lab work & pertinent test results  History of Anesthesia Complications Negative for: history of anesthetic complications  Airway Mallampati: II       Dental   Pulmonary neg COPD,  Cystic fibrosis          Cardiovascular hypertension, Pt. on medications (-) Past MI and (-) CHF (-) dysrhythmias (-) Valvular Problems/Murmurs     Neuro/Psych neg Seizures Depression    GI/Hepatic Neg liver ROS, GERD  Medicated and Controlled,  Endo/Other  neg diabetes  Renal/GU negative Renal ROS     Musculoskeletal   Abdominal   Peds  Hematology   Anesthesia Other Findings   Reproductive/Obstetrics                             Anesthesia Physical Anesthesia Plan  ASA: III  Anesthesia Plan: General   Post-op Pain Management:    Induction: Intravenous  PONV Risk Score and Plan:   Airway Management Planned: Oral ETT  Additional Equipment:   Intra-op Plan:   Post-operative Plan:   Informed Consent: I have reviewed the patients History and Physical, chart, labs and discussed the procedure including the risks, benefits and alternatives for the proposed anesthesia with the patient or authorized representative who has indicated his/her understanding and acceptance.     Plan Discussed with:   Anesthesia Plan Comments:         Anesthesia Quick Evaluation

## 2017-09-15 NOTE — Anesthesia Post-op Follow-up Note (Signed)
Anesthesia QCDR form completed.        

## 2017-09-15 NOTE — Transfer of Care (Signed)
Immediate Anesthesia Transfer of Care Note  Patient: Brenda Clarke  Procedure(s) Performed: SHOULDER ARTHROSCOPY WITH OPEN ROTATOR CUFF REPAIR (Left Shoulder)  Patient Location: PACU  Anesthesia Type:General  Level of Consciousness: drowsy and patient cooperative  Airway & Oxygen Therapy: Patient Spontanous Breathing and Patient connected to face mask oxygen  Post-op Assessment: Report given to RN, Post -op Vital signs reviewed and stable and Patient moving all extremities  Post vital signs: Reviewed and stable  Last Vitals:  Vitals Value Taken Time  BP    Temp    Pulse 99 09/15/2017  9:47 AM  Resp    SpO2 95 % 09/15/2017  9:47 AM  Vitals shown include unvalidated device data.  Last Pain:  Vitals:   09/15/17 0621  TempSrc: Oral  PainSc: 3       Patients Stated Pain Goal: 1 (37/94/44 [id number])  Complications: No apparent anesthesia complications

## 2017-09-15 NOTE — H&P (Signed)
Paper H&P to be scanned into permanent record. H&P reviewed and patient re-examined. No changes. 

## 2017-09-19 MED FILL — KALYDECO/150MG/TABS: KALYDECO/150MG/TABS | 28 days supply | Qty: 56 | Fill #6

## 2017-10-07 DIAGNOSIS — F3341 Major depressive disorder, recurrent, in partial remission: Secondary | ICD-10-CM | POA: Insufficient documentation

## 2017-10-17 NOTE — Unmapped (Signed)
[facility] Refill and Clinical Coordination Note  Medication(s): [facility] 150MG     Alyssa Willis, DOB: February 03, 1946  Phone: [phone] (home) , Alternate phone contact: N/A  Shipping address: [address]  [place] [address]  Phone or address changes today?: No  All above HIPAA information verified.  Insurance changes? No    Completed refill and clinical call assessment today to schedule patient's medication shipment from the [facility] [phone]).      MEDICATION RECONCILIATION    Confirmed the medication and dosage are correct and have not changed: Yes, regimen is correct and unchanged.    Were there any changes to your medication(s) in the past month:  No, there are no changes reported at this time.    ADHERENCE    Is this medicine transplant or covered by Medicare Part B? No.          Did you miss any doses in the past 4 weeks? No missed doses reported.  Adherence counseling provided? Not needed     SIDE EFFECT MANAGEMENT    Are you tolerating your medication?:  Alyssa Willis reports tolerating the medication.  Side effect management discussed: None      Therapy is appropriate and should be continued.    Evidence of clinical benefit: See Epic note from 03/03/17      FINANCIAL/SHIPPING    Delivery Scheduled: Yes, Expected medication delivery date: 10/18/17 via [facility]   Additional medications refilled: No additional medications/refills needed at this time.    The patient will receive an FSI print out for each medication shipped and additional FDA Medication Guides as required.  Patient education from [employer] or Robet Leu may also be included in the shipment.    Alyssa Willis did not have any additional questions at this time.    Delivery address validated in FSI scheduling system: Yes, address listed above is correct.      We will follow up with patient monthly for standard refill processing and delivery.      Thank you,  Julianne Rice   [facility] Specialty Pharmacist

## 2017-10-18 MED FILL — KALYDECO/150MG/TABS: KALYDECO/150MG/TABS | 28 days supply | Qty: 56 | Fill #7

## 2017-10-21 DIAGNOSIS — M7582 Other shoulder lesions, left shoulder: Secondary | ICD-10-CM | POA: Insufficient documentation

## 2017-10-26 ENCOUNTER — Other Ambulatory Visit: Payer: Self-pay | Admitting: Internal Medicine

## 2017-10-26 DIAGNOSIS — Z1231 Encounter for screening mammogram for malignant neoplasm of breast: Secondary | ICD-10-CM

## 2017-11-07 NOTE — Unmapped (Signed)
[facility] Refill Coordination Note    Specialty Medication(s) to be Shipped:   CF/Pulmonary: -[name] (ivacaftor) 150mg  tablet    Other medication(s) to be shipped:      Alyssa Willis, DOB: 12/03/1945  Phone: [phone] (home)   Shipping Address: [address]  [place] [address]    All above HIPAA information was verified with patient's family member.     Completed refill call assessment today to schedule patient's medication shipment from the [facility] [phone]).       Specialty medication(s) and dose(s) confirmed: Regimen is correct and unchanged.   Changes to medications: [name] reports no changes reported at this time.  Changes to insurance: No  Questions for the pharmacist: No    The patient will receive a drug information handout for each medication shipped and additional FDA Medication Guides as required.      DISEASE/MEDICATION-SPECIFIC INFORMATION        For CF patients: CF [facility] Active? Yes    ADHERENCE     Medication Adherence    Patient reported X missed doses in the last month:  0  Specialty Medication:  [name] 150MG  #OH-7 TABS  Patient is on additional specialty medications:  No  Demonstrates understanding of importance of adherence:  yes  Informant:  spouse  Reliability of informant:  reliable  Patient is at risk for Non-Adherence:  No  Support network for adherence:  family member  Confirmed plan for next specialty medication refill:  delivery by pharmacy  Refills needed for supportive medications:  not needed          Refill Coordination    Has the Patients' Contact Information Changed:  No  Is the Shipping Address Different:  No         MEDICARE PART B DOCUMENTATION     Not Applicable    SHIPPING     Shipping address confirmed in Epic.     Delivery Scheduled: Yes, Expected medication delivery date: 11/10/17 via UPS or courier.     Lajean Silvius   [facility] Specialty Technician

## 2017-11-09 ENCOUNTER — Ambulatory Visit: Payer: Medicare Other | Admitting: Nurse Practitioner

## 2017-11-10 MED FILL — KALYDECO 150 MG TABLET: 28 days supply | Qty: 56 | Fill #0

## 2017-11-10 MED FILL — KALYDECO 150 MG TABLET: 28 days supply | Qty: 56 | Fill #0 | Status: AC

## 2017-11-14 ENCOUNTER — Encounter: Payer: Self-pay | Admitting: Emergency Medicine

## 2017-11-14 ENCOUNTER — Emergency Department
Admission: EM | Admit: 2017-11-14 | Discharge: 2017-11-14 | Disposition: A | Discharge status: Home / Self Care | Payer: Medicare Other | Attending: Emergency Medicine | Admitting: Emergency Medicine

## 2017-11-14 DIAGNOSIS — R42 Dizziness and giddiness: Secondary | ICD-10-CM | POA: Diagnosis not present

## 2017-11-14 DIAGNOSIS — Z5321 Procedure and treatment not carried out due to patient leaving prior to being seen by health care provider: Secondary | ICD-10-CM | POA: Diagnosis not present

## 2017-11-14 LAB — COMPREHENSIVE METABOLIC PANEL
ALK PHOS: 82 U/L (ref 38–126)
ALT: 14 U/L (ref 0–44)
AST: 26 U/L (ref 15–41)
Albumin: 4.2 g/dL (ref 3.5–5.0)
Anion gap: 11 (ref 5–15)
BILIRUBIN TOTAL: 0.4 mg/dL (ref 0.3–1.2)
BUN: 15 mg/dL (ref 8–23)
CALCIUM: 9.6 mg/dL (ref 8.9–10.3)
CO2: 20 mmol/L — ABNORMAL LOW (ref 22–32)
CREATININE: 0.92 mg/dL (ref 0.44–1.00)
Chloride: 106 mmol/L (ref 98–111)
GFR calc Af Amer: >60 mL/min (ref >60)
GLUCOSE: 118 mg/dL — AB (ref 70–99)
Potassium: 3.8 mmol/L (ref 3.5–5.1)
Sodium: 137 mmol/L (ref 135–145)
TOTAL PROTEIN: 7.8 g/dL (ref 6.5–8.1)

## 2017-11-14 LAB — CBC
HCT: 41.4 % (ref 35.0–47.0)
Hemoglobin: 14.1 g/dL (ref 12.0–16.0)
MCH: 29.8 pg (ref 26.0–34.0)
MCHC: 34.2 g/dL (ref 32.0–36.0)
MCV: 87.2 fL (ref 80.0–100.0)
PLATELETS: 298 10*3/uL (ref 150–440)
RBC: 4.74 MIL/uL (ref 3.80–5.20)
RDW: 14.2 % (ref 11.5–14.5)
WBC: 8.8 10*3/uL (ref 3.6–11.0)

## 2017-11-14 LAB — LIPASE, BLOOD: Lipase: 24 U/L (ref 11–51)

## 2017-11-14 LAB — TROPONIN I: Troponin I: <0.03 ng/mL (ref <0.03)

## 2017-11-14 MED ORDER — SODIUM CHLORIDE 0.9 % IV BOLUS
1000.0000 mL | Freq: Once | INTRAVENOUS | Status: AC
  Administered 2017-11-14: 1000 mL via INTRAVENOUS

## 2017-11-14 MED ORDER — ONDANSETRON HCL 4 MG/2ML IJ SOLN: 4.0000 mg | Freq: Once | INTRAMUSCULAR | Status: DC | PRN

## 2017-11-14 NOTE — ED Triage Notes (Signed)
Patient presents to the ED with dizziness and weakness.  Patient reports nausea, vomiting, and diarrhea x 3-4 days.  Patient states, "I'm dehydrated."  Patient is complaining of a headache.  Denies abdominal pain.  Denies vomiting and diarrhea today.

## 2017-11-15 ENCOUNTER — Ambulatory Visit: Payer: Medicare Other | Admitting: Nurse Practitioner

## 2017-11-17 ENCOUNTER — Encounter: Payer: Self-pay | Admitting: *Deleted

## 2017-11-17 ENCOUNTER — Other Ambulatory Visit: Payer: Self-pay

## 2017-11-17 ENCOUNTER — Emergency Department
Admission: EM | Admit: 2017-11-17 | Discharge: 2017-11-17 | Disposition: A | Discharge status: Home / Self Care | Payer: Medicare Other | Attending: Emergency Medicine | Admitting: Emergency Medicine

## 2017-11-17 DIAGNOSIS — I10 Essential (primary) hypertension: Secondary | ICD-10-CM | POA: Diagnosis not present

## 2017-11-17 DIAGNOSIS — Z79899 Other long term (current) drug therapy: Secondary | ICD-10-CM | POA: Insufficient documentation

## 2017-11-17 DIAGNOSIS — N390 Urinary tract infection, site not specified: Secondary | ICD-10-CM

## 2017-11-17 DIAGNOSIS — R531 Weakness: Secondary | ICD-10-CM | POA: Diagnosis present

## 2017-11-17 DIAGNOSIS — R197 Diarrhea, unspecified: Secondary | ICD-10-CM | POA: Diagnosis not present

## 2017-11-17 LAB — CBC
HCT: 39.3 % (ref 35.0–47.0)
HEMOGLOBIN: 13.5 g/dL (ref 12.0–16.0)
MCH: 29.6 pg (ref 26.0–34.0)
MCHC: 34.4 g/dL (ref 32.0–36.0)
MCV: 86.2 fL (ref 80.0–100.0)
PLATELETS: 302 10*3/uL (ref 150–440)
RBC: 4.56 MIL/uL (ref 3.80–5.20)
RDW: 14.2 % (ref 11.5–14.5)
WBC: 6 10*3/uL (ref 3.6–11.0)

## 2017-11-17 LAB — URINALYSIS, COMPLETE (UACMP) WITH MICROSCOPIC
BILIRUBIN URINE: NEGATIVE
GLUCOSE, UA: NEGATIVE mg/dL
Hgb urine dipstick: NEGATIVE
KETONES UR: NEGATIVE mg/dL
NITRITE: NEGATIVE
PH: 5 (ref 5.0–8.0)
Protein, ur: NEGATIVE mg/dL
SPECIFIC GRAVITY, URINE: 1.014 (ref 1.005–1.030)

## 2017-11-17 LAB — COMPREHENSIVE METABOLIC PANEL
ALBUMIN: 4.2 g/dL (ref 3.5–5.0)
ALK PHOS: 89 U/L (ref 38–126)
ALT: 16 U/L (ref 0–44)
ANION GAP: 12 (ref 5–15)
AST: 27 U/L (ref 15–41)
BILIRUBIN TOTAL: 0.7 mg/dL (ref 0.3–1.2)
BUN: 20 mg/dL (ref 8–23)
CALCIUM: 9.4 mg/dL (ref 8.9–10.3)
CO2: 19 mmol/L — AB (ref 22–32)
CREATININE: 0.86 mg/dL (ref 0.44–1.00)
Chloride: 105 mmol/L (ref 98–111)
GFR calc non Af Amer: >60 mL/min (ref >60)
GLUCOSE: 119 mg/dL — AB (ref 70–99)
Potassium: 4.7 mmol/L (ref 3.5–5.1)
Sodium: 136 mmol/L (ref 135–145)
TOTAL PROTEIN: 7.4 g/dL (ref 6.5–8.1)

## 2017-11-17 LAB — LIPASE, BLOOD: Lipase: 19 U/L (ref 11–51)

## 2017-11-17 MED ORDER — SODIUM CHLORIDE 0.9 % IV BOLUS
1000.0000 mL | Freq: Once | INTRAVENOUS | Status: AC
  Administered 2017-11-17: 1000 mL via INTRAVENOUS

## 2017-11-17 MED ORDER — FOSFOMYCIN TROMETHAMINE 3 G PO PACK
3.0000 g | PACK | Freq: Once | ORAL | Status: AC
  Administered 2017-11-17: 3 g via ORAL
  Filled 2017-11-17 (×2): qty 3

## 2017-11-17 MED ORDER — METOCLOPRAMIDE HCL 10 MG PO TABS: 10.0000 mg | ORAL_TABLET | Freq: Four times a day (QID) | ORAL | 0 refills | Status: DC | PRN

## 2017-11-17 NOTE — ED Provider Notes (Signed)
[facility] Emergency Department Provider Note  Time seen: 3:42 PM  I have reviewed the triage vital signs and the nursing notes.   HISTORY  Chief Complaint No chief complaint on file.    HPI Brenda Clarke is a 72 y.o. female with a past medical history of cystic fibrosis, gastric reflux, hypertension, hyperlipidemia presents to the emergency department for fatigue and weakness.  According to the patient has been over the past for 5 days she was having nausea vomiting and diarrhea.  States her symptoms stopped about 2 days ago but she continues to feel weak and fatigued.  Patient was sent from her doctor for evaluation and possible IV fluids.  Patient denies any current vomiting but continues to feel nauseated at times.  Has not been eating or drinking much due to the nausea.  Denies any fever at any point.  Largely negative review of systems otherwise.   Past Medical History:  Diagnosis Date  . Cystic fibrosis ([place])   . Depression   . GERD (gastroesophageal reflux disease)   . History of hiatal hernia   . Hypercholesteremia   . Hypertension     Patient Active Problem List   Diagnosis Date Noted  . TIA (transient ischemic attack) 10/15/2015    Past Surgical History:  Procedure Laterality Date  . BRONCHOSCOPY    . CATARACT EXTRACTION W/ INTRAOCULAR LENS  IMPLANT, BILATERAL Bilateral   . COLONOSCOPY    . EYE SURGERY    . NASAL SINUS SURGERY    . SHOULDER ARTHROSCOPY WITH OPEN ROTATOR CUFF REPAIR Left 09/15/2017   Procedure: SHOULDER ARTHROSCOPY WITH OPEN ROTATOR CUFF REPAIR;  Surgeon: Corky Mull, MD;  Location: [facility] ORS;  Service: Orthopedics;  Laterality: Left;  . TONSILLECTOMY      Prior to Admission medications   Medication Sig Start Date End Date Taking? Authorizing Provider  Calcium Citrate 333 MG TABS Take 2 capsules by mouth daily.     [provider]  Cholecalciferol (VITAMIN D3) 5000 units CAPS Take 5,000 Units by mouth daily.     [provider]  doxycycline (VIBRA-TABS) 100 MG tablet Take 100 mg by mouth daily. 10/30/14   [provider]  enalapril (VASOTEC) 5 MG tablet Take 2.5 mg by mouth daily. 05/31/14   [provider]  galantamine (RAZADYNE) 4 MG tablet Take 4 mg by mouth every morning. 06/10/15 06/09/16  [provider]  Ivacaftor ([name]) 150 MG TABS Take 150 mg by mouth 2 (two) times daily.    [provider]  metoprolol succinate (TOPROL-XL) 25 MG 24 hr tablet Take 1 tablet by mouth 2 (two) times daily. 05/27/15   [provider]  Multiple Vitamins-Minerals (WOMENS MULTIVITAMIN PO) Take 1 tablet by mouth daily.    [provider]  Omega-3 Fatty Acids (FISH OIL) 1200 MG CAPS Take 1 capsule by mouth daily.    [provider]  omeprazole (PRILOSEC) 40 MG capsule Take 40 mg by mouth at bedtime.  05/27/15   [provider]  oxyCODONE (ROXICODONE) 5 MG immediate release tablet Take 1-2 tablets (5-10 mg total) by mouth every 4 (four) hours as needed. 09/15/17   Poggi, Marshall Cork, MD  ranitidine (ZANTAC) 300 MG tablet Take 300 mg by mouth daily.  05/27/15   [provider]  risedronate (ACTONEL) 150 MG tablet Take 150 mg by mouth every 30 (thirty) days. with water on empty stomach, nothing by mouth or lie down for next 30 minutes.  [provider]  sertraline (ZOLOFT) 100 MG tablet Take 100 mg by mouth at bedtime.    [provider]  simvastatin (ZOCOR) 40 MG tablet Take 40 mg by mouth at bedtime.  08/04/15   [provider]  Sodium Chloride 10 % NEBU Inhale 1 Dose into the lungs as needed.    [provider]  tretinoin (RETIN-A) 0.05 % cream Apply 1 application topically at bedtime.    [provider]  zolpidem (AMBIEN CR) 12.5 MG CR tablet Take 12.5 mg by mouth at bedtime as needed for sleep.  05/22/13   [provider]    Allergies  Allergen Reactions  . Tobramycin Shortness Of Breath     wheezing  . Codeine Itching    Can take it with benadryl  . Galantamine Nausea Only  . Hydrocodone Itching    Family History  Problem Relation Age of Onset  . Alzheimer's disease Mother   . Stroke Mother   . Breast cancer Neg Hx     Social History Social History   Tobacco Use  . Smoking status: Never Smoker  . Smokeless tobacco: Never Used  Substance Use Topics  . Alcohol use: Not Currently    Comment: very rarely  . Drug use: Never    Review of Systems Constitutional: Negative for fever. Cardiovascular: Negative for chest pain. Respiratory: Negative for shortness of breath. Gastrointestinal: Negative for abdominal pain.  Positive for nausea vomiting and diarrhea which subsided 2 days ago still with mild nausea. Genitourinary: Negative for urinary compaints Musculoskeletal: Negative for musculoskeletal complaints Skin: Negative for skin complaints  Neurological: Negative for headache All other ROS negative  ____________________________________________   PHYSICAL EXAM:  VITAL SIGNS: ED Triage Vitals  Enc Vitals Group     BP 11/17/17 1428 (!) 151/82     Pulse Rate 11/17/17 1428 73     Resp 11/17/17 1428 16     Temp 11/17/17 1428 98.9 F (37.2 C)     Temp src --      SpO2 11/17/17 1428 96 %     Weight 11/17/17 1429 120 lb (54.4 kg)     Height 11/17/17 1429 5\' 5"  (1.651 m)     Head Circumference --      Peak Flow --      Pain Score 11/17/17 1428 0     Pain Loc --      Pain Edu? --      Excl. in [place]? --    Constitutional: Alert and oriented. Well appearing and in no distress. Eyes: Normal exam ENT   Head: Normocephalic and atraumatic.   Mouth/Throat: Mucous membranes are moist. Cardiovascular: Normal rate, regular rhythm. No murmur Respiratory: Normal respiratory effort without tachypnea nor retractions. Breath sounds are clear Gastrointestinal: Soft and nontender. No distention.   Musculoskeletal: Nontender with normal range of motion in all  extremities.  Neurologic:  Normal speech and language. No gross focal neurologic deficits are appreciated. Skin:  Skin is warm, dry and intact.  Psychiatric: Mood and affect are normal  ____________________________________________   INITIAL IMPRESSION / ASSESSMENT AND PLAN / ED COURSE  Pertinent labs & imaging results that were available during my care of the patient were reviewed by me and considered in my medical decision making (see chart for details).  Patient presents to the emergency department for weakness and fatigue.  Had nausea vomiting diarrhea which subsided 2 days ago continues with mild nausea.  Differential would include dehydration, electrolyte or metabolic abnormality, anemia.  Overall the patient appears very well, no distress.  Nontender abdomen.  Lab work is reassuring anion gap of 12 which would indicate possible slight dehydration.  We will dose IV fluids and continue to closely monitor.  Patient agreeable to plan of care.  Urinalysis pending.  Analysis does show a possible very mild urinary infection we will treat with a one-time dose of fosfomycin.  Urine culture has been added.  We will discharge with PCP follow-up. ____________________________________________   FINAL CLINICAL IMPRESSION(S) / ED DIAGNOSES  Dehydration    Harvest Dark, MD 11/17/17 [phone]

## 2017-11-17 NOTE — ED Triage Notes (Signed)
First nurse:  Says has had upset stomach--vomting and diarrhea--sent by dr Sabra Heck for IVF for dehydration

## 2017-11-19 LAB — URINE CULTURE

## 2017-12-01 ENCOUNTER — Ambulatory Visit: Payer: Medicare Other | Attending: Nurse Practitioner | Admitting: Nurse Practitioner

## 2017-12-01 ENCOUNTER — Encounter: Payer: Self-pay | Admitting: Nurse Practitioner

## 2017-12-01 ENCOUNTER — Other Ambulatory Visit: Payer: Self-pay

## 2017-12-01 DIAGNOSIS — Z79891 Long term (current) use of opiate analgesic: Secondary | ICD-10-CM | POA: Diagnosis not present

## 2017-12-01 DIAGNOSIS — M810 Age-related osteoporosis without current pathological fracture: Secondary | ICD-10-CM | POA: Insufficient documentation

## 2017-12-01 DIAGNOSIS — M5030 Other cervical disc degeneration, unspecified cervical region: Secondary | ICD-10-CM | POA: Diagnosis not present

## 2017-12-01 DIAGNOSIS — G894 Chronic pain syndrome: Secondary | ICD-10-CM | POA: Insufficient documentation

## 2017-12-01 DIAGNOSIS — M47812 Spondylosis without myelopathy or radiculopathy, cervical region: Secondary | ICD-10-CM | POA: Insufficient documentation

## 2017-12-01 DIAGNOSIS — M542 Cervicalgia: Secondary | ICD-10-CM | POA: Diagnosis not present

## 2017-12-01 DIAGNOSIS — M79602 Pain in left arm: Secondary | ICD-10-CM

## 2017-12-01 DIAGNOSIS — M75102 Unspecified rotator cuff tear or rupture of left shoulder, not specified as traumatic: Secondary | ICD-10-CM | POA: Insufficient documentation

## 2017-12-01 DIAGNOSIS — Z79899 Other long term (current) drug therapy: Secondary | ICD-10-CM

## 2017-12-01 DIAGNOSIS — S61219A Laceration without foreign body of unspecified finger without damage to nail, initial encounter: Secondary | ICD-10-CM | POA: Insufficient documentation

## 2017-12-01 DIAGNOSIS — F339 Major depressive disorder, recurrent, unspecified: Secondary | ICD-10-CM | POA: Diagnosis not present

## 2017-12-01 DIAGNOSIS — M25512 Pain in left shoulder: Secondary | ICD-10-CM | POA: Diagnosis not present

## 2017-12-01 DIAGNOSIS — G8929 Other chronic pain: Secondary | ICD-10-CM | POA: Diagnosis present

## 2017-12-01 DIAGNOSIS — Z789 Other specified health status: Secondary | ICD-10-CM | POA: Insufficient documentation

## 2017-12-01 DIAGNOSIS — Z8673 Personal history of transient ischemic attack (TIA), and cerebral infarction without residual deficits: Secondary | ICD-10-CM | POA: Insufficient documentation

## 2017-12-01 DIAGNOSIS — Z9842 Cataract extraction status, left eye: Secondary | ICD-10-CM | POA: Insufficient documentation

## 2017-12-01 DIAGNOSIS — K219 Gastro-esophageal reflux disease without esophagitis: Secondary | ICD-10-CM | POA: Insufficient documentation

## 2017-12-01 DIAGNOSIS — J479 Bronchiectasis, uncomplicated: Secondary | ICD-10-CM | POA: Insufficient documentation

## 2017-12-01 DIAGNOSIS — M79646 Pain in unspecified finger(s): Secondary | ICD-10-CM | POA: Insufficient documentation

## 2017-12-01 DIAGNOSIS — M899 Disorder of bone, unspecified: Secondary | ICD-10-CM | POA: Insufficient documentation

## 2017-12-01 NOTE — Progress Notes (Signed)
Patient's Name: Brenda Clarke  MRN: [id number]  Referring Provider: Corky Mull, MD  DOB: 03/20/1946  PCP: Rusty Aus, MD  DOS: 12/01/2017  Note by: Dionisio David NP  Service setting: Ambulatory outpatient  Specialty: Interventional Pain Management  Location: [facility] (AMB) Pain Management Facility    Patient type: New Patient    Primary Reason(s) for Visit: Initial Patient Evaluation CC: Arm Pain  HPI  Brenda Clarke is a 72 y.o. year old, female patient, who comes today for an initial evaluation. 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[place]); Cystic fibrosis ([place]); Essential hypertension; Fibrocystic breast disease; Gastro-esophageal reflux disease with esophagitis; Heartburn; Hyperlipidemia; Laceration of finger; Low serum vitamin D; Migraine without status migrainosus, not intractable; Monocular diplopia; Multilevel degenerative disc disease; Neck pain; Pain in finger; Prolapse of anterior vaginal wall; Recurrent major depressive disorder, in partial remission ([place]); Right-sided low back pain without sciatica; Rotator cuff tendinitis, left; S/P cataract extraction and insertion of intraocular lens, left; Screen for colon cancer; Spondylosis of cervical region without myelopathy or radiculopathy; Traumatic incomplete tear of left rotator cuff; Tubular adenoma; Uterovaginal prolapse; Chronic left shoulder pain (Primary Area of Pain); Chronic pain of left upper extremity (Secondary Area of Pain); Chronic pain syndrome; Long term current use of opiate analgesic; Pharmacologic therapy; Disorder of skeletal system; and Problems influencing health status on their problem list.. Her primarily concern today is the Arm Pain  Pain Assessment: Location: Left Arm(left) Radiating: Denies Onset: More than a month  ago Quality: Constant, Aching, Sharp, Stabbing Severity: 4 /10 (subjective, self-reported pain score)  Note: Reported level is compatible with observation.                          Effect on ADL: unable to use my arm, limits my daily activites Timing: Constant Modifying factors: heat BP: (!) 146/78  HR: 76  Onset and Duration: Date of injury: january 2019 Cause of pain: working out and fell off a machine, broken left humerus, cystic fibrosis Severity: No change since onset, NAS-11 at its worse: 8/10, NAS-11 at its best: 8/10, NAS-11 now: 8/10 and NAS-11 on the average: 8/10 Timing: Not influenced by the time of the day Aggravating Factors: Lifiting and Motion Alleviating Factors: Hot packs and Using a brace Associated Problems: Pain that wakes patient up and Pain that does not allow patient to sleep Quality of Pain: Aching, Intermittent, Sharp, Shooting and Stabbing Previous Examinations or Tests: MRI scan and X-rays Previous Treatments: Physical Therapy and Traction  The patient comes into the clinics today for the first time for a chronic pain management evaluation.  According to the patient her primary area pain is in her left shoulder.  She admits this is related to a fall that she had a January 2019.  She suffered a fracture to her shoulder and humerus.  She did have surgery in March 2019 by Dr. Roland Rack.  She did do physical therapy which was effective.  She has had a recent MRI.    Her second area of pain is in her left arm.  She admits the pain from her shoulder radiates down into her elbow.  She denies any numbness or tingling.  She does have weakness.  She admits that there is pain with movement.  She did not have any surgery to her humerus he is desiring  to see if this will heal on its own.  She does not feel like this is working.  Physical therapy and images as above.  She admits that she has lower back pain and neck pain but does not want to address them at this time. Her husband  admits that she was sent here for additional pain medication.  Today I took the time to provide the patient with information regarding this pain practice. The patient was informed that the practice is divided into two sections: an interventional pain management section, as well as a completely separate and distinct medication management section. I explained that there are procedure days for interventional therapies, and evaluation days for follow-ups and medication management. Because of the amount of documentation required during both, they are kept separated. This means that there is the possibility that she may be scheduled for a procedure on one day, and medication management the next. I have also informed her that because of staffing and facility limitations, this practice will no longer take patients for medication management only. To illustrate the reasons for this, I gave the patient the example of surgeons, and how inappropriate it would be to refer a patient to his/her care, just to write for the post-surgical antibiotics on a surgery done by a different surgeon.   Because interventional pain management is part of the board-certified specialty for the doctors, the patient was informed that joining this practice means that they are open to any and all interventional therapies. I made it clear that this does not mean that they will be forced to have any procedures done. What this means is that I believe interventional therapies to be essential part of the diagnosis and proper management of chronic pain conditions. Therefore, patients not interested in these interventional alternatives will be better served under the care of a different practitioner.  The patient was also made aware of my Comprehensive Pain Management Safety Guidelines where by joining this practice, they limit all of their nerve blocks and joint injections to those done by our practice, for as long as we are retained to manage their  care. Historic Controlled Substance Pharmacotherapy Review  PMP and historical list of controlled substances: Temazepam 15 mg, hydromorphone 2 mg, oxycodone 5 mg, hydrocodone/acetaminophen 5/325 mg, total/acetaminophen 10/325 mg, tramadol 50 mg, zolpidem ER 12.5 mg, hydrocodone/Chlorphen ER suspension, Meperidine 50 mg, lorazepam 2 mg,  highest opioid analgesic regimen found: Hydrocodone/acetaminophen 10/325 1 tablet every 2-3 hours for 3 days (fill date 05/21/2014) hydrocodone 100 mg/day Most recent opioid analgesic: Hydromorphone 2 mg 1 tablet 4 times daily (fill date 10/26/2017) hydromorphone 8 mg/day Current opioid analgesics: Hydromorphone 2 mg 1 tablet 4 times daily (fill date 10/26/2017) hydromorphone 8 mg/day Highest recorded MME/day: 100 mg/day MME/day: 32 mg/day Medications: The patient did not bring the medication(s) to the appointment, as requested in our "New Patient Package" Pharmacodynamics: Desired effects: Analgesia: The patient reports >50% benefit. Reported improvement in function: The patient reports medication allows her to accomplish basic ADLs. Clinically meaningful improvement in function (CMIF): Sustained CMIF goals met Perceived effectiveness: Described as relatively effective, allowing for increase in activities of daily living (ADL) Undesirable effects: Side-effects or Adverse reactions: None reported Historical Monitoring: The patient  reports that she does not use drugs. List of all UDS Test(s): No results found for: MDMA, COCAINSCRNUR, PCPSCRNUR, PCPQUANT, CANNABQUANT, THCU, [place] List of all Serum Drug Screening Test(s):  No results found for: AMPHSCRSER, BARBSCRSER, BENZOSCRSER, COCAINSCRSER, PCPSCRSER, PCPQUANT, THCSCRSER, CANNABQUANT, OPIATESCRSER, OXYSCRSER, PROPOXSCRSER Historical  Background Evaluation:  [place] PDMP: Six (6) year initial data search conducted.             [place] Department of public safety, offender search: Editor, commissioning Information) Non-contributory Risk  Assessment Profile: Aberrant behavior: None observed or detected today Risk factors for fatal opioid overdose: None identified today Fatal overdose hazard ratio (HR): Calculation deferred Non-fatal overdose hazard ratio (HR): Calculation deferred Risk of opioid abuse or dependence: 0.7-3.0% with doses ? 36 MME/day and 6.1-26% with doses ? 120 MME/day. Substance use disorder (SUD) risk level: Pending results of Medical Psychology Evaluation for SUD Opioid risk tool (ORT) (Total Score): 0  ORT Scoring interpretation table:  Score <3 = Low Risk for SUD  Score between 4-7 = Moderate Risk for SUD  Score >8 = High Risk for Opioid Abuse   PHQ-2 Depression Scale:  Total score: 0  PHQ-2 Scoring interpretation table: (Score and probability of major depressive disorder)  Score 0 = No depression  Score 1 = 15.4% Probability  Score 2 = 21.1% Probability  Score 3 = 38.4% Probability  Score 4 = 45.5% Probability  Score 5 = 56.4% Probability  Score 6 = 78.6% Probability   PHQ-9 Depression Scale:  Total score: 0  PHQ-9 Scoring interpretation table:  Score 0-4 = No depression  Score 5-9 = Mild depression  Score 10-14 = Moderate depression  Score 15-19 = Moderately severe depression  Score 20-27 = Severe depression (2.4 times higher risk of SUD and 2.89 times higher risk of overuse)   Pharmacologic Plan: Pending ordered tests and/or consults  Meds  The patient has a current medication list which includes the following prescription(s): calcium citrate, vitamin d3, enalapril, ivacaftor, metoprolol succinate, multiple vitamins-minerals, fish oil, omeprazole, ranitidine, risedronate, sertraline, simvastatin, sodium chloride, zolpidem, and galantamine.  Current Outpatient Medications on File Prior to Visit  Medication Sig  . Calcium Citrate 333 MG TABS Take 2 capsules by mouth daily.   . Cholecalciferol (VITAMIN D3) 5000 units CAPS Take 5,000 Units by mouth daily.  . enalapril (VASOTEC) 5 MG  tablet Take 2.5 mg by mouth daily.  . Ivacaftor ([name]) 150 MG TABS Take 150 mg by mouth 2 (two) times daily.  . metoprolol succinate (TOPROL-XL) 25 MG 24 hr tablet Take 1 tablet by mouth 2 (two) times daily.  . Multiple Vitamins-Minerals (WOMENS MULTIVITAMIN PO) Take 1 tablet by mouth daily.  . Omega-3 Fatty Acids (FISH OIL) 1200 MG CAPS Take 1 capsule by mouth daily.  Marland Kitchen omeprazole (PRILOSEC) 40 MG capsule Take 40 mg by mouth at bedtime.   . ranitidine (ZANTAC) 300 MG tablet Take 300 mg by mouth daily.   . risedronate (ACTONEL) 150 MG tablet Take 150 mg by mouth every 30 (thirty) days. with water on empty stomach, nothing by mouth or lie down for next 30 minutes.  . sertraline (ZOLOFT) 100 MG tablet Take 100 mg by mouth at bedtime.  . simvastatin (ZOCOR) 40 MG tablet Take 40 mg by mouth at bedtime.   . Sodium Chloride 10 % NEBU Inhale 1 Dose into the lungs as needed.  . zolpidem (AMBIEN CR) 12.5 MG CR tablet Take 12.5 mg by mouth at bedtime as needed for sleep.   Marland Kitchen galantamine (RAZADYNE) 4 MG tablet Take 4 mg by mouth every morning.   No current facility-administered medications on file prior to visit.    Imaging Review  Shoulder Imaging:  Results for orders placed during the hospital encounter of 09/06/17  MR SHOULDER LEFT WO CONTRAST  Narrative CLINICAL DATA:  Nondisplaced fracture of the greater tuberosity left humerus. Persistent pain.  EXAM: MRI OF THE LEFT SHOULDER WITHOUT CONTRAST  TECHNIQUE: Multiplanar, multisequence MR imaging of the shoulder was performed. No intravenous contrast was administered.  COMPARISON:  05/24/2017  FINDINGS: Rotator cuff: Severe tendinosis of the supraspinatus tendon with a partial-thickness articular-surface tear. Moderate tendinosis of the infraspinatus tendon with a focal area of severe tendinosis. Teres minor tendon is intact. Subscapularis tendon is intact.  Muscles: No atrophy or fatty replacement of nor abnormal signal within,  the muscles of the rotator cuff.  Biceps long head:  Intact.  Acromioclavicular Joint: Mild arthropathy of the acromioclavicular joint. Type I acromion. Small amount of subacromial/subdeltoid bursal fluid.  Glenohumeral Joint: Small joint effusion.  No chondral defect.  Labrum: Grossly intact, but evaluation is limited by lack of intraarticular fluid.  Bones: Interval healing of the left greater tuberosity fracture with residual marrow edema which in part may be resulting from reactive subcortical marrow changes from severe tendinosis of the supraspinatus tendon. No aggressive osseous lesion.  Other: No fluid collection or hematoma.  IMPRESSION: 1. Severe tendinosis of the supraspinatus tendon with a partial-thickness articular-surface tear. 2. Moderate tendinosis of the infraspinatus tendon with a focal area of severe tendinosis. 3. Interval healing of the left greater tuberosity fracture with residual marrow edema which in part may be resulting from reactive subcortical marrow changes from severe tendinosis of the supraspinatus tendon.   Electronically Signed   By: Kathreen Devoid   On: 09/06/2017 09:59   Note: Available results from prior imaging studies were reviewed.        ROS  Cardiovascular History: High blood pressure Pulmonary or Respiratory History: Lung problems, Snoring  and Coughing up mucus (Bronchitis) Neurological History: No reported neurological signs or symptoms such as seizures, abnormal skin sensations, urinary and/or fecal incontinence, being born with an abnormal open spine and/or a tethered spinal cord Review of Past Neurological Studies:  Results for orders placed or performed during the hospital encounter of 10/17/15  MR Brain Wo Contrast   Narrative   CLINICAL DATA:  Acute CVA .  Slurred speech EXAM: MRI HEAD WITHOUT CONTRAST TECHNIQUE: Multiplanar, multiecho pulse sequences of the brain and surrounding structures were obtained without  intravenous contrast. COMPARISON:  CT head 10/15/2015 FINDINGS: Ventricle size normal. Cerebral volume normal for age with mild atrophy noted. Negative for acute infarct. Scattered white matter hyperintensities bilaterally compatible with chronic microvascular ischemia. Mild chronic microvascular ischemia also present in the pons. Negative for hemorrhage or fluid collection Negative for mass or edema.  No shift of the midline structures. Pituitary normal in size. Circle of Willis patent. Small distal right vertebral artery. Contracted right maxillary sinus. Remaining sinuses clear. No orbital lesion identified. IMPRESSION: Atrophy and chronic microvascular ischemic change in the white matter and pons Negative for acute infarct. These results will be called to the ordering clinician or representative by the Radiologist Assistant, and communication documented in the PACS or zVision Dashboard. Electronically Signed   By: Franchot Gallo M.D.   On: 10/17/2015 11:33   Psychological-Psychiatric History: No reported psychological or psychiatric signs or symptoms such as difficulty sleeping, anxiety, depression, delusions or hallucinations (schizophrenial), mood swings (bipolar disorders) or suicidal ideations or attempts Gastrointestinal History: No reported gastrointestinal signs or symptoms such as vomiting or evacuating blood, reflux, heartburn, alternating episodes of diarrhea and constipation, inflamed or scarred liver, or pancreas or irrregular and/or infrequent bowel movements Genitourinary History: Difficulty emptying the bladder or  controlling the flow of urine (Neurogenic bladder) Hematological History: No reported hematological signs or symptoms such as prolonged bleeding, low or poor functioning platelets, bruising or bleeding easily, hereditary bleeding problems, low energy levels due to low hemoglobin or being anemic Endocrine History: No reported endocrine signs or symptoms such  as high or low blood sugar, rapid heart rate due to high thyroid levels, obesity or weight gain due to slow thyroid or thyroid disease Rheumatologic History: No reported rheumatological signs and symptoms such as fatigue, joint pain, tenderness, swelling, redness, heat, stiffness, decreased range of motion, with or without associated rash Musculoskeletal History: Negative for myasthenia gravis, muscular dystrophy, multiple sclerosis or malignant hyperthermia Work History: Retired  Allergies  Ms. Muzquiz is allergic to tobramycin; codeine; galantamine; and hydrocodone.  Laboratory Chemistry  Inflammation Markers No results found for: CRP, ESRSEDRATE (CRP: Acute Phase) (ESR: Chronic Phase) Renal Function Markers Lab Results  Component Value Date   BUN 20 11/17/2017   CREATININE 0.86 11/17/2017   GFRAA >60 11/17/2017   GFRNONAA >60 11/17/2017   Hepatic Function Markers Lab Results  Component Value Date   AST 27 11/17/2017   ALT 16 11/17/2017   ALBUMIN 4.2 11/17/2017   ALKPHOS 89 11/17/2017   Electrolytes Lab Results  Component Value Date   NA 136 11/17/2017   K 4.7 11/17/2017   CL 105 11/17/2017   CALCIUM 9.4 11/17/2017   Neuropathy Markers No results found for: [id number] Bone Pathology Markers Lab Results  Component Value Date   ALKPHOS 89 11/17/2017   CALCIUM 9.4 11/17/2017   Coagulation Parameters Lab Results  Component Value Date   INR 1.00 10/15/2015   LABPROT 13.2 10/15/2015   APTT 26 10/15/2015   PLT 302 11/17/2017   Cardiovascular Markers Lab Results  Component Value Date   HGB 13.5 11/17/2017   HCT 39.3 11/17/2017   Note: Lab results reviewed.  PFSH  Drug: Ms. Hermida  reports that she does not use drugs. Alcohol:  reports that she drank alcohol. Tobacco:  reports that she has never smoked. She has never used smokeless tobacco. Medical:  has a past medical history of Cystic fibrosis ([place]), Depression, GERD (gastroesophageal reflux disease), History  of hiatal hernia, Hypercholesteremia, and Hypertension. Family: family history includes Alzheimer's disease in her mother; Stroke in her mother.  Past Surgical History:  Procedure Laterality Date  . BRONCHOSCOPY    . CATARACT EXTRACTION W/ INTRAOCULAR LENS  IMPLANT, BILATERAL Bilateral   . COLONOSCOPY    . EYE SURGERY    . NASAL SINUS SURGERY    . SHOULDER ARTHROSCOPY WITH OPEN ROTATOR CUFF REPAIR Left 09/15/2017   Procedure: SHOULDER ARTHROSCOPY WITH OPEN ROTATOR CUFF REPAIR;  Surgeon: Corky Mull, MD;  Location: [facility] ORS;  Service: Orthopedics;  Laterality: Left;  . TONSILLECTOMY     Active Ambulatory Problems    Diagnosis Date Noted  . TIA (transient ischemic attack) 10/15/2015  . Abnormal glucose 05/31/2012  . Age-related osteoporosis without current pathological fracture 03/20/2002  . Bronchiectasis ([place]) 12/01/2017  . Chronic sinusitis 08/03/2007  . Cortical cataract of both eyes 11/20/2015  . 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 Cystic fibrosis ([place]) 09/28/2012  . Essential hypertension 10/21/2003  . Fibrocystic breast disease 12/27/2011  . Gastro-esophageal reflux disease with esophagitis 08/29/2013  . Heartburn 06/08/2012  . Hyperlipidemia 08/29/2013  . Laceration of finger 12/01/2017  . Low serum vitamin D 01/27/2016  . Migraine without status migrainosus, not intractable 01/23/2014  . Monocular diplopia 08/12/2016  .  Multilevel degenerative disc disease 12/27/2014  . Neck pain 12/04/2013  . Pain in finger 12/01/2017  . Prolapse of anterior vaginal wall 11/04/2014  . Recurrent major depressive disorder, in partial remission ([place]) 10/07/2017  . Right-sided low back pain without sciatica 12/04/2013  . Rotator cuff tendinitis, left 10/21/2017  . S/P cataract extraction and insertion of intraocular lens, left 08/12/2016  . Screen for colon cancer 06/04/2011  . Spondylosis of cervical region without myelopathy or radiculopathy 02/02/2016  .  Traumatic incomplete tear of left rotator cuff 09/15/2017  . Tubular adenoma 09/28/2016  . Uterovaginal prolapse 10/07/2014  . Chronic left shoulder pain (Primary Area of Pain) 12/01/2017  . Chronic pain of left upper extremity (Secondary Area of Pain) 12/01/2017  . Chronic pain syndrome 12/01/2017  . Long term current use of opiate analgesic 12/01/2017  . Pharmacologic therapy 12/01/2017  . Disorder of skeletal system 12/01/2017  . Problems influencing health status 12/01/2017   Resolved Ambulatory Problems    Diagnosis Date Noted  . No Resolved Ambulatory Problems   Past Medical History:  Diagnosis Date  . Depression   . GERD (gastroesophageal reflux disease)   . History of hiatal hernia   . Hypercholesteremia   . Hypertension    Constitutional Exam  General appearance: Well nourished, well developed, and well hydrated. In no apparent acute distress Vitals:   12/01/17 1307  BP: (!) 146/78  Pulse: 76  Temp: 98.2 F (36.8 C)  SpO2: 98%  Weight: 118 lb (53.5 kg)  Height: _0  (1.651 m)   BMI Assessment: Estimated body mass index is 19.64 kg/m as calculated from the following:   Height as of this encounter: _1  (1.651 m).   Weight as of this encounter: 118 lb (53.5 kg).  BMI interpretation table: BMI level Category Range association with higher incidence of chronic pain  <18 kg/m2 Underweight   18.5-24.9 kg/m2 Ideal body weight   25-29.9 kg/m2 Overweight Increased incidence by 20%  30-34.9 kg/m2 Obese (Class I) Increased incidence by 68%  35-39.9 kg/m2 Severe obesity (Class II) Increased incidence by 136%  >40 kg/m2 Extreme obesity (Class III) Increased incidence by 254%   BMI Readings from Last 4 Encounters:  12/01/17 19.64 kg/m  11/17/17 19.97 kg/m  11/14/17 19.97 kg/m  09/15/17 20.72 kg/m   Wt Readings from Last 4 Encounters:  12/01/17 118 lb (53.5 kg)  11/17/17 120 lb (54.4 kg)  11/14/17 120 lb (54.4 kg)  09/15/17 124 lb 8 oz (56.5 kg)   Psych/Mental status: Alert, oriented x 3 (person, place, & time)       Eyes: PERLA Respiratory: No evidence of acute respiratory distress  Cervical Spine Exam  Inspection: No masses, redness, or swelling Alignment: Symmetrical Functional ROM: Unrestricted ROM      Stability: No instability detected Muscle strength & Tone: Functionally intact Sensory: Unimpaired Palpation: No palpable anomalies              Upper Extremity (UE) Exam    Side: Right upper extremity  Side: Left upper extremity  Inspection: No masses, redness, swelling, or asymmetry. No contractures  Inspection:  incisional scar from previous surgery well-healed  Functional ROM: Unrestricted ROM          Functional ROM: Limited ROM          Muscle strength & Tone: Functionally intact  Muscle strength & Tone: Normal strength (5/5)  Sensory: Unimpaired  Sensory: Unimpaired  Palpation: No palpable anomalies  Palpation: Tender              Specialized Test(s): Deferred         Specialized Test(s): Deferred          Gait & Posture Assessment  Ambulation: Unassisted Gait: Relatively normal for age and body habitus Posture: WNL   Assessment  Primary Diagnosis & Pertinent Problem List: Diagnoses of Chronic left shoulder pain (Primary Area of Pain), Chronic pain of left upper extremity (Secondary Area of Pain), Chronic pain syndrome, Long term current use of opiate analgesic, Pharmacologic therapy, Disorder of skeletal system, and Problems influencing health status were pertinent to this visit.  Visit Diagnosis: 1. Chronic left shoulder pain (Primary Area of Pain)   2. Chronic pain of left upper extremity (Secondary Area of Pain)   3. Chronic pain syndrome   4. Long term current use of opiate analgesic   5. Pharmacologic therapy   6. Disorder of skeletal system   7. Problems influencing health status    Plan of Care  Initial treatment plan:  Please be advised that as per protocol, today's visit has been an  evaluation only. We have not taken over the patient's controlled substance management.  Problem-specific plan: No problem-specific Assessment & Plan notes found for this encounter.  Ordered Lab-work, Procedure(s), Referral(s), & Consult(s): Orders Placed This Encounter  Procedures  . Compliance Drug Analysis, Ur  . Magnesium  . Sedimentation rate  . C-reactive protein   Pharmacotherapy: Medications ordered:  No orders of the defined types were placed in this encounter.  Medications administered during this visit: Connye Burkitt. Grape had no medications administered during this visit.   Pharmacotherapy under consideration:  Opioid Analgesics: The patient was informed that there is no guarantee that she would be a candidate for opioid analgesics. The decision will be made following CDC guidelines. This decision will be based on the results of diagnostic studies, as well as Ms. Hennigan's risk profile.  Membrane stabilizer: To be determined at a later time Muscle relaxant: To be determined at a later time NSAID: To be determined at a later time Other analgesic(s): To be determined at a later time   Interventional therapies under consideration: Ms. Pietro was informed that there is no guarantee that she would be a candidate for interventional therapies. The decision will be based on the results of diagnostic studies, as well as Ms. Rathman's risk profile.  Possible procedure(s): Diagnostic left suprascapular nerve block Possible left suprascapular radiofrequency ablation   Provider-requested follow-up: Return for 2nd Visit, w/ Dr. Dossie Arbour.  Future Appointments  Date Time Provider [place]  12/12/2017 10:00 AM [facility]  12/14/2017  9:30 AM Milinda Pointer, MD [facility] None    Primary Care Physician: Rusty Aus, MD Location: [facility] Note by:  Date: 12/01/2017; Time: 4:01 PM  Pain Score Disclaimer: We use the NRS-11 scale.  This is a self-reported, subjective measurement of pain severity with only modest accuracy. It is used primarily to identify changes within a particular patient. It must be understood that outpatient pain scales are significantly less accurate that those used for research, where they can be applied under ideal controlled circumstances with minimal exposure to variables. In reality, the score is likely to be a combination of pain intensity and pain affect, where pain affect describes the degree of emotional arousal or changes in action readiness caused by the sensory experience of pain. Factors such as social and work situation, setting, emotional state,  anxiety levels, expectation, and prior pain experience may influence pain perception and show large inter-individual differences that may also be affected by time variables.  Patient instructions provided during this appointment: Patient Instructions   ____________________________________________________________________________________________  Appointment Policy Summary  It is our goal and responsibility to provide the medical community with assistance in the evaluation and management of patients with chronic pain. Unfortunately our resources are limited. Because we do not have an unlimited amount of time, or available appointments, we are required to closely monitor and manage their use. The following rules exist to maximize their use:  Patient's responsibilities: 1. Punctuality:  At what time should I arrive? You should be physically present in our office 30 minutes before your scheduled appointment. Your scheduled appointment is with your assigned healthcare provider. However, it takes 5-10 minutes to be "checked-in", and another 15 minutes for the nurses to do the admission. If you arrive to our office at the time you were given for your appointment, you will end up being at least 20-25 minutes late to your appointment with the  provider. 2. Tardiness:  What happens if I arrive only a few minutes after my scheduled appointment time? You will need to reschedule your appointment. The cutoff is your appointment time. This is why it is so important that you arrive at least 30 minutes before that appointment. If you have an appointment scheduled for 10:00 AM and you arrive at 10:01, you will be required to reschedule your appointment.  3. Plan ahead:  Always assume that you will encounter traffic on your way in. Plan for it. If you are dependent on a driver, make sure they understand these rules and the need to arrive early. 4. Other appointments and responsibilities:  Avoid scheduling any other appointments before or after your pain clinic appointments.  5. Be prepared:  Write down everything that you need to discuss with your healthcare provider and give this information to the admitting nurse. Write down the medications that you will need refilled. Bring your pills and bottles (even the empty ones), to all of your appointments, except for those where a procedure is scheduled. 6. No children or pets:  Find someone to take care of them. It is not appropriate to bring them in. 7. Scheduling changes:  We request "advanced notification" of any changes or cancellations. 8. Advanced notification:  Defined as a time period of more than 24 hours prior to the originally scheduled appointment. This allows for the appointment to be offered to other patients. 9. Rescheduling:  When a visit is rescheduled, it will require the cancellation of the original appointment. For this reason they both fall within the category of "Cancellations".  10. Cancellations:  They require advanced notification. Any cancellation less than 24 hours before the  appointment will be recorded as a "No Show". 11. No Show:  Defined as an unkept appointment where the patient failed to notify or declare to the practice their intention or inability to keep the  appointment.  Corrective process for repeat offenders:  1. Tardiness: Three (3) episodes of rescheduling due to late arrivals will be recorded as one (1) "No Show". 2. Cancellation or reschedule: Three (3) cancellations or rescheduling will be recorded as one (1) "No Show". 3. "No Shows": Three (3) "No Shows" within a 12 month period will result in discharge from the practice. ____________________________________________________________________________________________  ____________________________________________________________________________________________  Pain Scale  Introduction: The pain score used by this practice is the Verbal Numerical Rating Scale (VNRS-11). This is  an 11-point scale. It is for adults and children 10 years or older. There are significant differences in how the pain score is reported, used, and applied. Forget everything you learned in the past and learn this scoring system.  General Information: The scale should reflect your current level of pain. Unless you are specifically asked for the level of your worst pain, or your average pain. If you are asked for one of these two, then it should be understood that it is over the past 24 hours.  Basic Activities of Daily Living (ADL): Personal hygiene, dressing, eating, transferring, and using restroom.  Instructions: Most patients tend to report their level of pain as a combination of two factors, their physical pain and their psychosocial pain. This last one is also known as "suffering" and it is reflection of how physical pain affects you socially and psychologically. From now on, report them separately. From this point on, when asked to report your pain level, report only your physical pain. Use the following table for reference.  Pain Clinic Pain Levels (0-5/10)  Pain Level Score  Description  No Pain 0   Mild pain 1 Nagging, annoying, but does not interfere with basic activities of daily living (ADL). Patients are  able to eat, bathe, get dressed, toileting (being able to get on and off the toilet and perform personal hygiene functions), transfer (move in and out of bed or a chair without assistance), and maintain continence (able to control bladder and bowel functions). Blood pressure and heart rate are unaffected. A normal heart rate for a healthy adult ranges from 60 to 100 bpm (beats per minute).   Mild to moderate pain 2 Noticeable and distracting. Impossible to hide from other people. More frequent flare-ups. Still possible to adapt and function close to normal. It can be very annoying and may have occasional stronger flare-ups. With discipline, patients may get used to it and adapt.   Moderate pain 3 Interferes significantly with activities of daily living (ADL). It becomes difficult to feed, bathe, get dressed, get on and off the toilet or to perform personal hygiene functions. Difficult to get in and out of bed or a chair without assistance. Very distracting. With effort, it can be ignored when deeply involved in activities.   Moderately severe pain 4 Impossible to ignore for more than a few minutes. With effort, patients may still be able to manage work or participate in some social activities. Very difficult to concentrate. Signs of autonomic nervous system discharge are evident: dilated pupils (mydriasis); mild sweating (diaphoresis); sleep interference. Heart rate becomes elevated (>115 bpm). Diastolic blood pressure (lower number) rises above 100 mmHg. Patients find relief in laying down and not moving.   Severe pain 5 Intense and extremely unpleasant. Associated with frowning face and frequent crying. Pain overwhelms the senses.  Ability to do any activity or maintain social relationships becomes significantly limited. Conversation becomes difficult. Pacing back and forth is common, as getting into a comfortable position is nearly impossible. Pain wakes you up from deep sleep. Physical signs will be  obvious: pupillary dilation; increased sweating; goosebumps; brisk reflexes; cold, clammy hands and feet; nausea, vomiting or dry heaves; loss of appetite; significant sleep disturbance with inability to fall asleep or to remain asleep. When persistent, significant weight loss is observed due to the complete loss of appetite and sleep deprivation.  Blood pressure and heart rate becomes significantly elevated. Caution: If elevated blood pressure triggers a pounding headache associated with blurred vision, then  the patient should immediately seek attention at an urgent or emergency care unit, as these may be signs of an impending stroke.    Emergency Department Pain Levels (6-10/10)  Emergency Room Pain 6 Severely limiting. Requires emergency care and should not be seen or managed at an outpatient pain management facility. Communication becomes difficult and requires great effort. Assistance to reach the emergency department may be required. Facial flushing and profuse sweating along with potentially dangerous increases in heart rate and blood pressure will be evident.   Distressing pain 7 Self-care is very difficult. Assistance is required to transport, or use restroom. Assistance to reach the emergency department will be required. Tasks requiring coordination, such as bathing and getting dressed become very difficult.   Disabling pain 8 Self-care is no longer possible. At this level, pain is disabling. The individual is unable to do even the most "basic" activities such as walking, eating, bathing, dressing, transferring to a bed, or toileting. Fine motor skills are lost. It is difficult to think clearly.   Incapacitating pain 9 Pain becomes incapacitating. Thought processing is no longer possible. Difficult to remember your own name. Control of movement and coordination are lost.   The worst pain imaginable 10 At this level, most patients pass out from pain. When this level is reached, collapse of the  autonomic nervous system occurs, leading to a sudden drop in blood pressure and heart rate. This in turn results in a temporary and dramatic drop in blood flow to the brain, leading to a loss of consciousness. Fainting is one of the body's self defense mechanisms. Passing out puts the brain in a calmed state and causes it to shut down for a while, in order to begin the healing process.    Summary: 1. Refer to this scale when providing [place] with your pain level. 2. Be accurate and careful when reporting your pain level. This will help with your care. 3. Over-reporting your pain level will lead to loss of credibility. 4. Even a level of 1/10 means that there is pain and will be treated at our facility. 5. High, inaccurate reporting will be documented as "Symptom Exaggeration", leading to loss of credibility and suspicions of possible secondary gains such as obtaining more narcotics, or wanting to appear disabled, for fraudulent reasons. 6. Only pain levels of 5 or below will be seen at our facility. 7. Pain levels of 6 and above will be sent to the Emergency Department and the appointment cancelled. ____________________________________________________________________________________________

## 2017-12-01 NOTE — Patient Instructions (Signed)

## 2017-12-02 LAB — C-REACTIVE PROTEIN: CRP: 3 mg/L (ref 0–10)

## 2017-12-02 LAB — SEDIMENTATION RATE: Sed Rate: 5 mm/hr (ref 0–40)

## 2017-12-02 LAB — MAGNESIUM: Magnesium: 2 mg/dL (ref 1.6–2.3)

## 2017-12-06 NOTE — Unmapped (Signed)
[name] states that she does not need any medication at this time. Requests a call back next week.    Regis Bill, PharmD, [facility]  [facility]  [address]  [place], [place] [address]  ph: [phone]  f: [phone]

## 2017-12-07 LAB — COMPLIANCE DRUG ANALYSIS, UR

## 2017-12-08 ENCOUNTER — Telehealth: Payer: Self-pay | Admitting: Nurse Practitioner

## 2017-12-08 NOTE — Telephone Encounter (Signed)
What is she upset about there is nothing there

## 2017-12-08 NOTE — Telephone Encounter (Signed)
Patient has questions about some things on her record that she doesn't understand. Wants to speak with Brenda Clarke regarding Opioid Abuse Risk. Very upset.

## 2017-12-09 NOTE — Telephone Encounter (Signed)
She wants to know what it means about the risk for medication abuse, she said you gave her the papers.

## 2017-12-12 ENCOUNTER — Ambulatory Visit
Admission: RE | Admit: 2017-12-12 | Discharge: 2017-12-12 | Disposition: A | Discharge status: Home / Self Care | Payer: Medicare Other | Source: Ambulatory Visit | Attending: Internal Medicine | Admitting: Internal Medicine

## 2017-12-12 DIAGNOSIS — Z1231 Encounter for screening mammogram for malignant neoplasm of breast: Secondary | ICD-10-CM | POA: Insufficient documentation

## 2017-12-12 NOTE — Progress Notes (Signed)
Patient's Name: Brenda Clarke  MRN: [id number]  Referring Provider: Rusty Aus, MD  DOB: 06-22-1945  PCP: Rusty Aus, MD  DOS: 12/14/2017  Note by: Gaspar Cola, MD  Service setting: Ambulatory outpatient  Specialty: Interventional Pain Management  Location: [facility] (AMB) Pain Management Facility    Patient type: Established   Primary Reason(s) for Visit: Encounter for evaluation before starting new chronic pain management plan of care (Level of risk: moderate) CC: Neck Pain  HPI  Brenda Clarke is a 72 y.o. year old, female patient, who comes today for a follow-up evaluation to review the test results and decide on a treatment plan. 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[place]); Cystic fibrosis ([place]); Essential hypertension; Fibrocystic breast disease; Gastro-esophageal reflux disease with esophagitis; Heartburn; Hyperlipidemia; Laceration of finger; Low serum vitamin D; Migraine without status migrainosus, not intractable; Monocular diplopia; Multilevel degenerative disc disease; Chronic neck pain; Pain in finger; Prolapse of anterior vaginal wall; Recurrent major depressive disorder, in partial remission ([place]); Chronic low back pain [facility] Area of Pain) (Right); Chronic tendinitis of rotator cuff (Left); S/P cataract extraction and insertion of intraocular lens, left; Screen for colon cancer; Spondylosis of cervical region without myelopathy or radiculopathy; Tubular adenoma; Uterovaginal prolapse; Chronic shoulder pain (Primary Area of Pain) (Left); Chronic upper extremity pain (Secondary Area of Pain) (Left); Chronic pain syndrome; Long term current use of opiate analgesic; Pharmacologic therapy; Disorder of skeletal system; Problems influencing health status; Cervicalgia; and Traumatic tear of  rotator cuff, sequela (Left) on their problem list. Her primarily concern today is the Neck Pain  Pain Assessment: Location:   Neck Radiating: radiaties up the back of head Onset: More than a month ago Quality: Constant, Aching, Sharp Severity: 6 /10 (subjective, self-reported pain score)  Note: Reported level is inconsistent with clinical observations. Clinically the patient looks like a 3/10 A 3/10 is viewed as "Moderate" and described as significantly interfering with activities of daily living (ADL). It becomes difficult to feed, bathe, get dressed, get on and off the toilet or to perform personal hygiene functions. Difficult to get in and out of bed or a chair without assistance. Very distracting. With effort, it can be ignored when deeply involved in activities. Information on the proper use of the pain scale provided to the patient today. When using our objective Pain Scale, levels between 6 and 10/10 are said to belong in an emergency room, as it progressively worsens from a 6/10, described as severely limiting, requiring emergency care not usually available at an outpatient pain management facility. At a 6/10 level, communication becomes difficult and requires great effort. Assistance to reach the emergency department may be required. Facial flushing and profuse sweating along with potentially dangerous increases in heart rate and blood pressure will be evident. Effect on ADL: limits my daily activites Timing: Constant Modifying factors: heat, rest, medications BP: (!) 143/72  HR: 71  Brenda Clarke comes in today for a follow-up visit after her initial evaluation on 12/08/2017. Today we went over the results of her tests. These were explained in "Layman's terms". During today's appointment we went over my diagnostic impression, as well as the proposed treatment plan.  According to the patient her primary area pain is in her left shoulder.  She admits this is related to a fall that she had a  January 2019.  She suffered a  fracture to her shoulder and humerus.  She did have surgery in March 2019 by Dr. Roland Rack.  She did do physical therapy which was effective.  She has had a recent MRI.    Her second area of pain is in her left arm.  She admits the pain from her shoulder radiates down into her elbow.  She denies any numbness or tingling.  She does have weakness.  She admits that there is pain with movement.  She did not have any surgery to her humerus he is desiring to see if this will heal on its own.  She does not feel like this is working.  Physical therapy and images as above.  She admits that she has lower back pain and neck pain but does not want to address them at this time. Her husband admits that she was sent here for additional pain medication.   In considering the treatment plan options, Ms. Fournier was reminded that I no longer take patients for medication management only. I asked her to let me know if she had no intention of taking advantage of the interventional therapies, so that we could make arrangements to provide this space to someone interested. I also made it clear that undergoing interventional therapies for the purpose of getting pain medications is very inappropriate on the part of a patient, and it will not be tolerated in this practice. This type of behavior would suggest true addiction and therefore it requires referral to an addiction specialist.   The patient has indicated that she is not crazy about the idea of having any interventional therapy.  Because of this, the patient has decided to hold on and think about her options.  She indicated that she would call us to schedule the procedure, if she decides to go for that.  Further details on both, my assessment(s), as well as the proposed treatment plan, please see below.  Controlled Substance Pharmacotherapy Assessment REMS (Risk Evaluation and Mitigation Strategy)  Analgesic: Hydromorphone 2 mg 1 tablet every 6  hours (4/day) (fill date 10/26/2017) hydromorphone 8 mg/day (last prescribed on 10/26/2017 number 28 pills by Jeneen Rinks L. McGhee, MPAS.  Dr.Jeffrey Poggi, MD/[facility]) Highest recorded MME/day: 100 mg/day MME/day: 32 mg/day Pill Count: None expected due to no prior prescriptions written by our practice. No notes on file Pharmacokinetics: Liberation and absorption (onset of action): WNL Distribution (time to peak effect): WNL Metabolism and excretion (duration of action): WNL         Pharmacodynamics: Desired effects: Analgesia: Ms. Laski reports >50% benefit. Functional ability: Patient reports that medication allows her to accomplish basic ADLs Clinically meaningful improvement in function (CMIF): Sustained CMIF goals met Perceived effectiveness: Described as relatively effective, allowing for increase in activities of daily living (ADL) Undesirable effects: Side-effects or Adverse reactions: None reported Monitoring: [place] PMP: Online review of the past 39-monthperiod previously conducted. Not applicable at this point since we have not taken over the patient's medication management yet. List of other Serum/Urine Drug Screening Test(s):  No results found for: AMPHSCRSER, BARBSCRSER, BENZOSCRSER, COCAINSCRSER, COCAINSCRNUR, PCPSCRSER, THCSCRSER, THCU, [place]QU[place], [place] of all UDS test(s) done:  Lab Results  Component Value Date   SUMMARY FINAL 12/01/2017   Last UDS on record: Summary  Date Value Ref Range Status  12/01/2017 FINAL  Final    Comment:    ==================================================================== TOXASSURE COMP DRUG ANALYSIS,UR ==================================================================== Test  Result       Flag       Units Drug Present and Declared for Prescription Verification   Sertraline                     PRESENT      EXPECTED   Desmethylsertraline             PRESENT      EXPECTED    Desmethylsertraline is an expected metabolite of sertraline.   Metoprolol                     PRESENT      EXPECTED Drug Present not Declared for Prescription Verification   Oxazepam                       >1099        UNEXPECTED ng/mg creat   Temazepam                      >1099        UNEXPECTED ng/mg creat    Oxazepam and temazepam are expected metabolites of diazepam.    Oxazepam is also an expected metabolite of other benzodiazepine    drugs, including chlordiazepoxide, prazepam, clorazepate,    halazepam, and temazepam.  Oxazepam and temazepam are available    as scheduled prescription medications.   Acetaminophen                  PRESENT      UNEXPECTED   Salicylate                     PRESENT      UNEXPECTED Drug Absent but Declared for Prescription Verification   Zolpidem                       Not Detected UNEXPECTED    Zolpidem, as indicated in the declared medication list, is not    always detected even when used as directed. ==================================================================== Test                      Result    Flag   Units      Ref Range   Creatinine              182              mg/dL      >=20 ==================================================================== Declared Medications:  The flagging and interpretation on this report are based on the  following declared medications.  Unexpected results may arise from  inaccuracies in the declared medications.  **Note: The testing scope of this panel includes these medications:  Metoprolol (Toprol)  Sertraline (Zoloft)  **Note: The testing scope of this panel does not include small to  moderate amounts of these reported medications:  Zolpidem (Ambien)  **Note: The testing scope of this panel does not include following  reported medications:  Calcium citrate  Enalapril (Vasotec)  Galantamine (Razadyne)  Ivacaftor  Multivitamin (MVI)  Omega-3 Fatty Acids (Fish Oil)  Omeprazole  (Prilosec)  Ranitidine (Zantac)  Risedronate (Actonel)  Simvastatin (Zocor)  Sodium Chloride  Vitamin D3 ==================================================================== For clinical consultation, please call [phone]. ====================================================================    UDS interpretation: Unexpected findings not considered significantly abnormal. Patient informed of the CDC guidelines and recommendations to stay away from the concomitant use of benzodiazepines and opioids due to the increased  risk of respiratory depression and death. Medication Assessment Form: Patient introduced to form today Treatment compliance: Treatment may start today if patient agrees with proposed plan. Evaluation of compliance is not applicable at this point Risk Assessment Profile: Aberrant behavior: See initial evaluations. None observed or detected today Comorbid factors increasing risk of overdose: See initial evaluation. No additional risks detected today Opioid risk tool (ORT) (Total Score): 3 Personal History of Substance Abuse (SUD-Substance use disorder):  Alcohol: Alcohol: Negative  Illegal Drugs: Illegal Drugs: Negative  Rx Drugs: Rx Drugs: Negative  ORT Risk Level calculation: Opioid Risk Interpretation: Low Risk Risk of substance use disorder (SUD): Low Opioid Risk Tool - 12/14/17 1128      Family History of Substance Abuse   Alcohol  Negative    Illegal Drugs  Positive Female    Rx Drugs  Negative      Personal History of Substance Abuse   Alcohol  Negative    Illegal Drugs  Negative    Rx Drugs  Negative      Age   Age between 48-45 years   No      History of Preadolescent Sexual Abuse   History of Preadolescent Sexual Abuse  Negative or Female      Psychological Disease   Psychological Disease  Negative    Depression  Negative      Total Score   Opioid Risk Tool Scoring  3    Opioid Risk Interpretation  Low Risk      ORT Scoring interpretation  table:  Score <3 = Low Risk for SUD  Score between 4-7 = Moderate Risk for SUD  Score >8 = High Risk for Opioid Abuse   Risk Mitigation Strategies:  Patient opioid safety counseling: No controlled substances prescribed. Patient-Prescriber Agreement (PPA): No agreement signed.  Controlled substance notification to other providers: None required. No opioid therapy.  Pharmacologic Plan: Ms. Depaoli is not interested in the treatment plan that we had to offer. She is not interested interventional therapies, only on medications. Unfortunately, we do not have the necessary resources to take on her case for medication management only.  Laboratory Chemistry  Inflammation Markers (CRP: Acute Phase) (ESR: Chronic Phase) Lab Results  Component Value Date   CRP 3 12/01/2017   ESRSEDRATE 5 12/01/2017                         Renal Function Markers Lab Results  Component Value Date   BUN 20 11/17/2017   CREATININE 0.86 11/17/2017   GFRAA >60 11/17/2017   GFRNONAA >60 11/17/2017                             Hepatic Function Markers Lab Results  Component Value Date   AST 27 11/17/2017   ALT 16 11/17/2017   ALBUMIN 4.2 11/17/2017   ALKPHOS 89 11/17/2017   LIPASE 19 11/17/2017                        Electrolytes Lab Results  Component Value Date   NA 136 11/17/2017   K 4.7 11/17/2017   CL 105 11/17/2017   CALCIUM 9.4 11/17/2017   MG 2.0 12/01/2017                        Coagulation Parameters Lab Results  Component Value Date   INR 1.00 10/15/2015  LABPROT 13.2 10/15/2015   APTT 26 10/15/2015   PLT 302 11/17/2017                        Cardiovascular Markers Lab Results  Component Value Date   TROPONINI <0.03 11/14/2017   HGB 13.5 11/17/2017   HCT 39.3 11/17/2017                         Note: Lab results reviewed.  Recent Diagnostic Imaging Review  Shoulder Imaging: Shoulder-L MR wo contrast:  Results for orders placed during the hospital encounter of 09/06/17  (surgery done 09/14/17)  Pre-Surgical MR SHOULDER LEFT WO CONTRAST   Narrative CLINICAL DATA:  Nondisplaced fracture of the greater tuberosity left humerus. Persistent pain.  EXAM: MRI OF THE LEFT SHOULDER WITHOUT CONTRAST  TECHNIQUE: Multiplanar, multisequence MR imaging of the shoulder was performed. No intravenous contrast was administered.  COMPARISON:  05/24/2017  FINDINGS: Rotator cuff: Severe tendinosis of the supraspinatus tendon with a partial-thickness articular-surface tear. Moderate tendinosis of the infraspinatus tendon with a focal area of severe tendinosis. Teres minor tendon is intact. Subscapularis tendon is intact.  Muscles: No atrophy or fatty replacement of nor abnormal signal within, the muscles of the rotator cuff.  Biceps long head:  Intact.  Acromioclavicular Joint: Mild arthropathy of the acromioclavicular joint. Type I acromion. Small amount of subacromial/subdeltoid bursal fluid.  Glenohumeral Joint: Small joint effusion.  No chondral defect.  Labrum: Grossly intact, but evaluation is limited by lack of intraarticular fluid.  Bones: Interval healing of the left greater tuberosity fracture with residual marrow edema which in part may be resulting from reactive subcortical marrow changes from severe tendinosis of the supraspinatus tendon. No aggressive osseous lesion.  Other: No fluid collection or hematoma.  IMPRESSION: 1. Severe tendinosis of the supraspinatus tendon with a partial-thickness articular-surface tear. 2. Moderate tendinosis of the infraspinatus tendon with a focal area of severe tendinosis. 3. Interval healing of the left greater tuberosity fracture with residual marrow edema which in part may be resulting from reactive subcortical marrow changes from severe tendinosis of the supraspinatus tendon.   Electronically Signed   By: Kathreen Devoid   On: 09/06/2017 09:59    Complexity Note: Imaging results reviewed. Results  shared with Ms. Pieratt, using Layman's terms.                         Meds   Current Outpatient Medications:  .  Calcium Citrate 333 MG TABS, Take 2 capsules by mouth daily. , Disp: , Rfl:  .  Cholecalciferol (VITAMIN D3) 5000 units CAPS, Take 5,000 Units by mouth daily., Disp: , Rfl:  .  enalapril (VASOTEC) 5 MG tablet, Take 2.5 mg by mouth daily., Disp: , Rfl:  .  Ivacaftor ([name]) 150 MG TABS, Take 150 mg by mouth 2 (two) times daily., Disp: , Rfl:  .  metoprolol succinate (TOPROL-XL) 25 MG 24 hr tablet, Take 1 tablet by mouth 2 (two) times daily., Disp: , Rfl:  .  Multiple Vitamins-Minerals (WOMENS MULTIVITAMIN PO), Take 1 tablet by mouth daily., Disp: , Rfl:  .  Omega-3 Fatty Acids (FISH OIL) 1200 MG CAPS, Take 1 capsule by mouth daily., Disp: , Rfl:  .  omeprazole (PRILOSEC) 40 MG capsule, Take 40 mg by mouth at bedtime. , Disp: , Rfl:  .  ranitidine (ZANTAC) 300 MG tablet, Take 300 mg by mouth daily. , Disp: ,  Rfl:  .  risedronate (ACTONEL) 150 MG tablet, Take 150 mg by mouth every 30 (thirty) days. with water on empty stomach, nothing by mouth or lie down for next 30 minutes., Disp: , Rfl:  .  sertraline (ZOLOFT) 100 MG tablet, Take 100 mg by mouth at bedtime., Disp: , Rfl:  .  simvastatin (ZOCOR) 40 MG tablet, Take 40 mg by mouth at bedtime. , Disp: , Rfl:  .  Sodium Chloride 10 % NEBU, Inhale 1 Dose into the lungs as needed., Disp: , Rfl:  .  zolpidem (AMBIEN CR) 12.5 MG CR tablet, Take 12.5 mg by mouth at bedtime as needed for sleep. , Disp: , Rfl:  .  galantamine (RAZADYNE) 4 MG tablet, Take 4 mg by mouth every morning., Disp: , Rfl:   ROS  Constitutional: Denies any fever or chills Gastrointestinal: No reported hemesis, hematochezia, vomiting, or acute GI distress Musculoskeletal: Denies any acute onset joint swelling, redness, loss of ROM, or weakness Neurological: No reported episodes of acute onset apraxia, aphasia, dysarthria, agnosia, amnesia, paralysis, loss of  coordination, or loss of consciousness  Allergies  Ms. Risby is allergic to tobramycin; codeine; galantamine; and hydrocodone.  PFSH  Drug: Ms. Ruedas  reports that she does not use drugs. Alcohol:  reports that she drank alcohol. Tobacco:  reports that she has never smoked. She has never used smokeless tobacco. Medical:  has a past medical history of Cystic fibrosis ([place]), Depression, GERD (gastroesophageal reflux disease), History of hiatal hernia, Hypercholesteremia, and Hypertension. Surgical: Ms. Nuon  has a past surgical history that includes Colonoscopy; Bronchoscopy; Nasal sinus surgery; Tonsillectomy; Eye surgery; Cataract extraction w/ intraocular lens  implant, bilateral (Bilateral); and Shoulder arthroscopy with open rotator cuff repair (Left, 09/15/2017). Family: family history includes Alzheimer's disease in her mother; Stroke in her mother.  Constitutional Exam  General appearance: Well nourished, well developed, and well hydrated. In no apparent acute distress Vitals:   12/14/17 1117  BP: (!) 143/72  Pulse: 71  Temp: 98 F (36.7 C)  SpO2: 97%  Weight: 125 lb (56.7 kg)  Height: 5' 5" (1.651 m)   BMI Assessment: Estimated body mass index is 20.8 kg/m as calculated from the following:   Height as of this encounter: 5' 5" (1.651 m).   Weight as of this encounter: 125 lb (56.7 kg).  BMI interpretation table: BMI level Category Range association with higher incidence of chronic pain  <18 kg/m2 Underweight   18.5-24.9 kg/m2 Ideal body weight   25-29.9 kg/m2 Overweight Increased incidence by 20%  30-34.9 kg/m2 Obese (Class I) Increased incidence by 68%  35-39.9 kg/m2 Severe obesity (Class II) Increased incidence by 136%  >40 kg/m2 Extreme obesity (Class III) Increased incidence by 254%   Patient's current BMI Ideal Body weight  Body mass index is 20.8 kg/m. Ideal body weight: 57 kg (125 lb 10.6 oz)   BMI Readings from Last 4 Encounters:  12/14/17 20.80 kg/m   12/01/17 19.64 kg/m  11/17/17 19.97 kg/m  11/14/17 19.97 kg/m   Wt Readings from Last 4 Encounters:  12/14/17 125 lb (56.7 kg)  12/01/17 118 lb (53.5 kg)  11/17/17 120 lb (54.4 kg)  11/14/17 120 lb (54.4 kg)  Psych/Mental status: Alert, oriented x 3 (person, place, & time)       Eyes: PERLA Respiratory: No evidence of acute respiratory distress  Cervical Spine Area Exam  Skin & Axial Inspection: No masses, redness, edema, swelling, or associated skin lesions Alignment: Symmetrical Functional ROM: Unrestricted ROM  Stability: No instability detected Muscle Tone/Strength: Functionally intact. No obvious neuro-muscular anomalies detected. Sensory (Neurological): Unimpaired Palpation: No palpable anomalies              Upper Extremity (UE) Exam    Side: Right upper extremity  Side: Left upper extremity  Skin & Extremity Inspection: Skin color, temperature, and hair growth are WNL. No peripheral edema or cyanosis. No masses, redness, swelling, asymmetry, or associated skin lesions. No contractures.  Skin & Extremity Inspection: Skin color, temperature, and hair growth are WNL. No peripheral edema or cyanosis. No masses, redness, swelling, asymmetry, or associated skin lesions. No contractures.  Functional ROM: Unrestricted ROM          Functional ROM: Decreased ROM          Muscle Tone/Strength: Functionally intact. No obvious neuro-muscular anomalies detected.  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          Sensory (Neurological): Arthropathic arthralgia          Palpation: No palpable anomalies              Palpation: Complains of area being tender to palpation              Provocative Test(s):  Phalen's test: deferred Tinel's test: deferred Apley's scratch test (touch opposite shoulder):  Action 1 (Across chest): Adequate ROM Action 2 (Overhead): Adequate ROM Action 3 (LB reach): Adequate ROM   Provocative Test(s):  Phalen's test:  deferred Tinel's test: deferred Apley's scratch test (touch opposite shoulder):  Action 1 (Across chest): Adequate ROM Action 2 (Overhead): Decreased ROM Action 3 (LB reach): Decreased ROM    Thoracic Spine Area Exam  Skin & Axial Inspection: No masses, redness, or swelling Alignment: Symmetrical Functional ROM: Unrestricted ROM Stability: No instability detected Muscle Tone/Strength: Functionally intact. No obvious neuro-muscular anomalies detected. Sensory (Neurological): Unimpaired Muscle strength & Tone: No palpable anomalies  Lumbar Spine Area Exam  Skin & Axial Inspection: No masses, redness, or swelling Alignment: Symmetrical Functional ROM: Decreased ROM       Stability: No instability detected Muscle Tone/Strength: Functionally intact. No obvious neuro-muscular anomalies detected. Sensory (Neurological): Movement-associated pain Palpation: Complains of area being tender to palpation       Provocative Tests: Hyperextension/rotation test: deferred today       Lumbar quadrant test (Kemp's test): deferred today       Lateral bending test: deferred today       Patrick's Maneuver: deferred today                   FABER test: deferred today                   S-I anterior distraction/compression test: deferred today         S-I lateral compression test: deferred today         S-I Thigh-thrust test: deferred today         S-I Gaenslen's test: deferred today          Gait & Posture Assessment  Ambulation: Unassisted Gait: Relatively normal for age and body habitus Posture: Antalgic   Lower Extremity Exam    Side: Right lower extremity  Side: Left lower extremity  Stability: No instability observed          Stability: No instability observed          Skin & Extremity Inspection: Skin color, temperature, and hair growth are WNL. No peripheral edema or cyanosis. No masses, redness,  swelling, asymmetry, or associated skin lesions. No contractures.  Skin & Extremity Inspection:  Skin color, temperature, and hair growth are WNL. No peripheral edema or cyanosis. No masses, redness, swelling, asymmetry, or associated skin lesions. No contractures.  Functional ROM: Unrestricted ROM                  Functional ROM: Unrestricted ROM                  Muscle Tone/Strength: Functionally intact. No obvious neuro-muscular anomalies detected.  Muscle Tone/Strength: Functionally intact. No obvious neuro-muscular anomalies detected.  Sensory (Neurological): Unimpaired  Sensory (Neurological): Unimpaired  Palpation: No palpable anomalies  Palpation: No palpable anomalies   Assessment & Plan  Primary Diagnosis & Pertinent Problem List: The primary encounter diagnosis was Chronic pain syndrome. Diagnoses of Chronic shoulder pain (Primary Area of Pain) (Left), Chronic tendinitis of rotator cuff (Left), Traumatic tear of rotator cuff, sequela (Left), Chronic upper extremity pain (Secondary Area of Pain) (Left), Chronic low back pain (Tertiary Area of Pain) (Right), and Chronic neck pain were also pertinent to this visit.  Visit Diagnosis: 1. Chronic pain syndrome   2. Chronic shoulder pain (Primary Area of Pain) (Left)   3. Chronic tendinitis of rotator cuff (Left)   4. Traumatic tear of rotator cuff, sequela (Left)   5. Chronic upper extremity pain (Secondary Area of Pain) (Left)   6. Chronic low back pain [facility] Area of Pain) (Right)   7. Chronic neck pain    Problems updated and reviewed during this visit: Problem  Traumatic tear of rotator cuff, sequela (Left)  Chronic low back pain (Tertiary Area of Pain) (Right)  Pharmacologic Therapy  Disorder of Skeletal System  Problems Influencing Health Status   Time Note: Greater than 50% of the 40 minute(s) of face-to-face time spent with Ms. Batterson, was spent in counseling/coordination of care regarding: the appropriate use of the pain scale, Ms. Connors's primary cause of pain, the results of her recent test(s), the significance  of each one oth the test(s) anomalies and it's corresponding characteristic pain pattern(s), the treatment plan, treatment alternatives, the risks and possible complications of proposed treatment, medication side effects, realistic expectations, the goals of pain management (increased in functionality) and the need to collect and read the AVS material.  Plan of Care  Pharmacotherapy (Medications Ordered): No orders of the defined types were placed in this encounter.  Procedure Orders     SHOULDER INJECTION (PRN) Lab Orders  No laboratory test(s) ordered today   Imaging Orders  No imaging studies ordered today   Referral Orders  No referral(s) requested today   Pharmacological management options:  Opioid Analgesics: I will not be prescribing any opioids at this time Membrane stabilizer: None prescribed at this time Muscle relaxant: None prescribed at this time NSAID: None prescribed at this time Other analgesic(s): None prescribed at this time   Interventional management options: Planned, scheduled, and/or pending:    Diagnostic/Therapeutic Left Shoulder/Humeral steroid injection #1 under fluoro and IV sedation. (PRN)   Considering:   Diagnostic left suprascapular nerve block  Possible left suprascapular radiofrequency ablation    PRN Procedures:   None at this time   Provider-requested follow-up: Return for PRN Procedure (w/ sedation): (L) Shoulder/Humoral steroid inj..  No future appointments.  Primary Care Physician: Rusty Aus, MD Location: [facility] Note by: Gaspar Cola, MD Date: 12/14/2017; Time: 6:29 PM

## 2017-12-13 DIAGNOSIS — M542 Cervicalgia: Secondary | ICD-10-CM | POA: Insufficient documentation

## 2017-12-14 ENCOUNTER — Other Ambulatory Visit: Payer: Self-pay

## 2017-12-14 ENCOUNTER — Encounter: Payer: Self-pay | Admitting: Pain Medicine

## 2017-12-14 ENCOUNTER — Ambulatory Visit: Payer: Medicare Other | Attending: Pain Medicine | Admitting: Pain Medicine

## 2017-12-14 VITALS — BP 143/72 | HR 71 | Temp 98.0°F | Ht 65.0 in | Wt 125.0 lb

## 2017-12-14 DIAGNOSIS — B9689 Other specified bacterial agents as the cause of diseases classified elsewhere: Secondary | ICD-10-CM | POA: Insufficient documentation

## 2017-12-14 DIAGNOSIS — X58XXXS Exposure to other specified factors, sequela: Secondary | ICD-10-CM | POA: Diagnosis not present

## 2017-12-14 DIAGNOSIS — K219 Gastro-esophageal reflux disease without esophagitis: Secondary | ICD-10-CM | POA: Insufficient documentation

## 2017-12-14 DIAGNOSIS — Z79891 Long term (current) use of opiate analgesic: Secondary | ICD-10-CM | POA: Diagnosis not present

## 2017-12-14 DIAGNOSIS — M47812 Spondylosis without myelopathy or radiculopathy, cervical region: Secondary | ICD-10-CM | POA: Insufficient documentation

## 2017-12-14 DIAGNOSIS — M542 Cervicalgia: Secondary | ICD-10-CM

## 2017-12-14 DIAGNOSIS — J329 Chronic sinusitis, unspecified: Secondary | ICD-10-CM | POA: Diagnosis not present

## 2017-12-14 DIAGNOSIS — Z8673 Personal history of transient ischemic attack (TIA), and cerebral infarction without residual deficits: Secondary | ICD-10-CM | POA: Insufficient documentation

## 2017-12-14 DIAGNOSIS — G894 Chronic pain syndrome: Secondary | ICD-10-CM

## 2017-12-14 DIAGNOSIS — F3341 Major depressive disorder, recurrent, in partial remission: Secondary | ICD-10-CM | POA: Diagnosis not present

## 2017-12-14 DIAGNOSIS — I10 Essential (primary) hypertension: Secondary | ICD-10-CM | POA: Diagnosis not present

## 2017-12-14 DIAGNOSIS — Z885 Allergy status to narcotic agent status: Secondary | ICD-10-CM | POA: Insufficient documentation

## 2017-12-14 DIAGNOSIS — M545 Low back pain: Secondary | ICD-10-CM | POA: Diagnosis not present

## 2017-12-14 DIAGNOSIS — M25512 Pain in left shoulder: Secondary | ICD-10-CM | POA: Diagnosis not present

## 2017-12-14 DIAGNOSIS — S46012S Strain of muscle(s) and tendon(s) of the rotator cuff of left shoulder, sequela: Secondary | ICD-10-CM

## 2017-12-14 DIAGNOSIS — M7582 Other shoulder lesions, left shoulder: Secondary | ICD-10-CM | POA: Diagnosis not present

## 2017-12-14 DIAGNOSIS — M79602 Pain in left arm: Secondary | ICD-10-CM | POA: Insufficient documentation

## 2017-12-14 DIAGNOSIS — G8929 Other chronic pain: Secondary | ICD-10-CM

## 2017-12-14 DIAGNOSIS — Z729 Problem related to lifestyle, unspecified: Secondary | ICD-10-CM | POA: Insufficient documentation

## 2017-12-14 DIAGNOSIS — M79646 Pain in unspecified finger(s): Secondary | ICD-10-CM | POA: Insufficient documentation

## 2017-12-14 DIAGNOSIS — E7800 Pure hypercholesterolemia, unspecified: Secondary | ICD-10-CM | POA: Diagnosis not present

## 2017-12-14 DIAGNOSIS — K449 Diaphragmatic hernia without obstruction or gangrene: Secondary | ICD-10-CM | POA: Diagnosis not present

## 2017-12-14 DIAGNOSIS — M810 Age-related osteoporosis without current pathological fracture: Secondary | ICD-10-CM | POA: Insufficient documentation

## 2017-12-14 DIAGNOSIS — Z5181 Encounter for therapeutic drug level monitoring: Secondary | ICD-10-CM | POA: Diagnosis not present

## 2017-12-14 DIAGNOSIS — N6019 Diffuse cystic mastopathy of unspecified breast: Secondary | ICD-10-CM | POA: Insufficient documentation

## 2017-12-14 DIAGNOSIS — G43909 Migraine, unspecified, not intractable, without status migrainosus: Secondary | ICD-10-CM | POA: Insufficient documentation

## 2017-12-14 DIAGNOSIS — R7309 Other abnormal glucose: Secondary | ICD-10-CM | POA: Insufficient documentation

## 2017-12-14 DIAGNOSIS — Z79899 Other long term (current) drug therapy: Secondary | ICD-10-CM | POA: Insufficient documentation

## 2017-12-14 MED FILL — KALYDECO 150 MG TABLET: 28 days supply | Qty: 56 | Fill #1 | Status: AC

## 2017-12-14 MED FILL — KALYDECO 150 MG TABLET: 28 days supply | Qty: 56 | Fill #1

## 2017-12-14 NOTE — Patient Instructions (Signed)
____________________________________________________________________________________________  General Risks and Possible Complications  Patient Responsibilities: It is important that you read this as it is part of your informed consent. It is our duty to inform you of the risks and possible complications associated with treatments offered to you. It is your responsibility as a patient to read this and to ask questions about anything that is not clear or that you believe was not covered in this document.  Patient's Rights: You have the right to refuse treatment. You also have the right to change your mind, even after initially having agreed to have the treatment done. However, under this last option, if you wait until the last second to change your mind, you may be charged for the materials used up to that point.  Introduction: Medicine is not an exact science. Everything in Medicine, including the lack of treatment(s), carries the potential for danger, harm, or loss (which is by definition: Risk). In Medicine, a complication is a secondary problem, condition, or disease that can aggravate an already existing one. All treatments carry the risk of possible complications. The fact that a side effects or complications occurs, does not imply that the treatment was conducted incorrectly. It must be clearly understood that these can happen even when everything is done following the highest safety standards.  No treatment: You can choose not to proceed with the proposed treatment alternative. The "PRO(s)" would include: avoiding the risk of complications associated with the therapy. The "CON(s)" would include: not getting any of the treatment benefits. These benefits fall under one of three categories: diagnostic; therapeutic; and/or palliative. Diagnostic benefits include: getting information which can ultimately lead to improvement of the disease or symptom(s). Therapeutic benefits are those associated with the  successful treatment of the disease. Finally, palliative benefits are those related to the decrease of the primary symptoms, without necessarily curing the condition (example: decreasing the pain from a flare-up of a chronic condition, such as incurable terminal cancer).  General Risks and Complications: These are associated to most interventional treatments. They can occur alone, or in combination. They fall under one of the following six (6) categories: no benefit or worsening of symptoms; bleeding; infection; nerve damage; allergic reactions; and/or death. 1. No benefits or worsening of symptoms: In Medicine there are no guarantees, only probabilities. No healthcare provider can ever guarantee that a medical treatment will work, they can only state the probability that it may. Furthermore, there is always the possibility that the condition may worsen, either directly, or indirectly, as a consequence of the treatment. 2. Bleeding: This is more common if the patient is taking a blood thinner, either prescription or over the counter (example: Goody Powders, Fish oil, Aspirin, Garlic, etc.), or if suffering a condition associated with impaired coagulation (example: Hemophilia, cirrhosis of the liver, low platelet counts, etc.). However, even if you do not have one on these, it can still happen. If you have any of these conditions, or take one of these drugs, make sure to notify your treating physician. 3. Infection: This is more common in patients with a compromised immune system, either due to disease (example: diabetes, cancer, human immunodeficiency virus [HIV], etc.), or due to medications or treatments (example: therapies used to treat cancer and rheumatological diseases). However, even if you do not have one on these, it can still happen. If you have any of these conditions, or take one of these drugs, make sure to notify your treating physician. 4. Nerve Damage: This is more common when the   treatment is  an invasive one, but it can also happen with the use of medications, such as those used in the treatment of cancer. The damage can occur to small secondary nerves, or to large primary ones, such as those in the spinal cord and brain. This damage may be temporary or permanent and it may lead to impairments that can range from temporary numbness to permanent paralysis and/or brain death. 5. Allergic Reactions: Any time a substance or material comes in contact with our body, there is the possibility of an allergic reaction. These can range from a mild skin rash (contact dermatitis) to a severe systemic reaction (anaphylactic reaction), which can result in death. 6. Death: In general, any medical intervention can result in death, most of the time due to an unforeseen complication. ____________________________________________________________________________________________  ____________________________________________________________________________________________  Pain Prevention Technique  Definition:   A technique used to minimize the effects of an activity known to cause inflammation or swelling, which in turn leads to an increase in pain.  Purpose: To prevent swelling from occurring. It is based on the fact that it is easier to prevent swelling from happening than it is to get rid of it, once it occurs.  Contraindications: 1. Anyone with allergy or hypersensitivity to the recommended medications. 2. Anyone taking anticoagulants (Blood Thinners) (e.g., Coumadin, Warfarin, Plavix, etc.). 3. Patients in Renal Failure.  Technique: Before you undertake an activity known to cause pain, or a flare-up of your chronic pain, and before you experience any pain, do the following:  1. On a full stomach, take 4 (four) over the counter Ibuprofens 200mg  tablets (Motrin), for a total of 800 mg. 2. In addition, take over the counter Magnesium 400 to 500 mg, before doing the activity.  3. Six (6) hours later,  again on a full stomach, repeat the Ibuprofen. 4. That night, take a warm shower and stretch under the running warm water.  This technique may be sufficient to abort the pain and discomfort before it happens. Keep in mind that it takes a lot less medication to prevent swelling than it takes to eliminate it once it occurs.  ____________________________________________________________________________________________   ____________________________________________________________________________________________  Preparing for Procedure with Sedation  Instructions: . Oral Intake: Do not eat or drink anything for at least 8 hours prior to your procedure. . Transportation: Public transportation is not allowed. Bring an adult driver. The driver must be physically present in our waiting room before any procedure can be started. Marland Kitchen Physical Assistance: Bring an adult physically capable of assisting you, in the event you need help. This adult should keep you company at home for at least 6 hours after the procedure. . Blood Pressure Medicine: Take your blood pressure medicine with a sip of water the morning of the procedure. . Blood thinners: Notify our staff if you are taking any blood thinners. Depending on which one you take, there will be specific instructions on how and when to stop it. . Diabetics on insulin: Notify the staff so that you can be scheduled 1st case in the morning. If your diabetes requires high dose insulin, take only  of your normal insulin dose the morning of the procedure and notify the staff that you have done so. . Preventing infections: Shower with an antibacterial soap the morning of your procedure. . Build-up your immune system: Take 1000 mg of Vitamin C with every meal (3 times a day) the day prior to your procedure. Marland Kitchen Antibiotics: Inform the staff if you have a condition  or reason that requires you to take antibiotics before dental procedures. . Pregnancy: If you are pregnant,  call and cancel the procedure. . Sickness: If you have a cold, fever, or any active infections, call and cancel the procedure. . Arrival: You must be in the facility at least 30 minutes prior to your scheduled procedure. . Children: Do not bring children with you. . Dress appropriately: Bring dark clothing that you would not mind if they get stained. . Valuables: Do not bring any jewelry or valuables.  Procedure appointments are reserved for interventional treatments only. Marland Kitchen No Prescription Refills. . No medication changes will be discussed during procedure appointments. . No disability issues will be discussed.  Reasons to call and reschedule or cancel your procedure: (Following these recommendations will minimize the risk of a serious complication.) . Surgeries: Avoid having procedures within 2 weeks of any surgery. (Avoid for 2 weeks before or after any surgery). . Flu Shots: Avoid having procedures within 2 weeks of a flu shots or . (Avoid for 2 weeks before or after immunizations). . Barium: Avoid having a procedure within 7-10 days after having had a radiological study involving the use of radiological contrast. (Myelograms, Barium swallow or enema study). . Heart attacks: Avoid any elective procedures or surgeries for the initial 6 months after a "Myocardial Infarction" (Heart Attack). . Blood thinners: It is imperative that you stop these medications before procedures. Let us know if you if you take any blood thinner.  . Infection: Avoid procedures during or within two weeks of an infection (including chest colds or gastrointestinal problems). Symptoms associated with infections include: Localized redness, fever, chills, night sweats or profuse sweating, burning sensation when voiding, cough, congestion, stuffiness, runny nose, sore throat, diarrhea, nausea, vomiting, cold or Flu symptoms, recent or current infections. It is specially important if the infection is over the area that we  intend to treat. Marland Kitchen Heart and lung problems: Symptoms that may suggest an active cardiopulmonary problem include: cough, chest pain, breathing difficulties or shortness of breath, dizziness, ankle swelling, uncontrolled high or unusually low blood pressure, and/or palpitations. If you are experiencing any of these symptoms, cancel your procedure and contact your primary care physician for an evaluation.  Remember:  Regular Business hours are:  Monday to Thursday 8:00 AM to 4:00 PM  Provider's Schedule: Milinda Pointer, MD:  Procedure days: Tuesday and Thursday 7:30 AM to 4:00 PM  Gillis Santa, MD:  Procedure days: Monday and Wednesday 7:30 AM to 4:00 PM ____________________________________________________________________________________________

## 2017-12-14 NOTE — Unmapped (Signed)
[facility] Refill Coordination Note  Specialty Medication(s): [name] 150 ([place]: 1 week)  Additional Medications shipped: n/a denied HTS     Alyssa Willis, DOB: 08/30/1945  Phone: [phone] (home) , Alternate phone contact: N/A  Phone or address changes today?: No  All above HIPAA information was verified with patient's family member.  Shipping Address: [address]  [place] [address]   Insurance changes? No    Completed refill call assessment today to schedule patient's medication shipment from the [facility] [phone]).      Confirmed the medication and dosage are correct and have not changed: Yes, regimen is correct and unchanged.    Confirmed patient started or stopped the following medications in the past month:  No, there are no changes reported at this time.    Are you tolerating your medication?:  Alyssa Willis reports tolerating the medication.    ADHERENCE        Did you miss any doses in the past 4 weeks? No missed doses reported.    FINANCIAL/SHIPPING    Delivery Scheduled: Yes, Expected medication delivery date: 12/15/17 next day     CF [employer]well Grant Active? Yes    The patient will receive a drug information handout for each medication shipped and additional FDA Medication Guides as required.      Alyssa Willis did not have any additional questions at this time.    Delivery address validated in Epic.    We will follow up with patient monthly for standard refill processing and delivery.      Thank you,  Julianne Rice   [facility] Specialty Pharmacist

## 2018-01-11 NOTE — Unmapped (Addendum)
TODAY WE TALKED ABOUT:    1. Being careful with your lifting (try to keep your lifting to 10-20 lbs or less)  2. Kegel exercises (10-12 squeezes in a row, 4-5 times daily, for example, do these at breakfast, lunch, dinner, and before bedtime)  3.  We discussed you removing the pessary and leaving it out over night 1 time per week  4.  We discussed you using 1 gm of estrogen cream in the vagina over night on the night you leave out the pessary  5.  We will see you back in 3 months to see how you are doing.  6.  When inserting / removing the pessary, apply some KY jelly or coconut oil at the vaginal opening to make it easier to insert / remove the pessary  7.  When inserting the pessary, fold it in half like you are doing and ONLY apply jelly at the front end (the end that goes in first)      QUESTIONS?    Education material was provided to you today regarding your diagnoses.     If you have further questions or concerns, please call the Urogynecology Triage Nurse:  ??  Triage Nurse: [phone]  ?? [place] Triage Nurse: [phone]                                                                           Please leave a message and someone will contact you within 1 business day.     For urgent issues after 4:30PM weekdays and on weekends, call the [facility] operator at [phone] and have them page the [facility] Urogynecologist on call. Please reserve this option for important issues only.     For emergencies go to your nearest Emergency Room or call 911.  Patient Education        Kegel Exercises: Care Instructions  Your Care Instructions    Kegel exercises strengthen muscles around the bladder. These muscles control the flow of urine. Kegel exercises are sometime called pelvic floor exercises. They can help prevent urine leakage and keep the pelvic organs in place.  A woman who just had a baby might want to try Kegel exercises. They can strengthen pelvic muscles that have been weakened by pregnancy and childbirth. A man or woman may use Kegel exercises to treat urine leakage.  You do Kegel exercises by tightening the muscles you use when you urinate. You will likely need to do these exercises for several weeks to get better.  Follow-up care is a key part of your treatment and safety. Be sure to make and go to all appointments, and call your doctor if you are having problems. It's also a good idea to know your test results and keep a list of the medicines you take.  How can you care for yourself at home?  ?? Do Kegel exercises.  ? Find the muscles you need to strengthen. To do this, tighten the muscles that stop your urine while you are going to the bathroom. These are the same muscles you squeeze during Kegel exercises.  ? Squeeze the muscles as hard as you can. Your belly and thighs should not move.  ? Hold the squeeze for 3 seconds.  Then relax for 3 seconds.  ? Start with 3 seconds, and then add 1 second each week until you are able to squeeze for 10 seconds.  ? Repeat the exercise 10 to 15 times for each session. Do three or more sessions each day.  ?? You can check to see if you are using the right muscles. Place a finger in your vagina and squeeze around it. You are doing them right when you feel pressure around your finger. Your doctor may also suggest that you put special weights in your vagina while you do the exercises.  ?? Do not smoke. It can irritate the bladder. If you need help quitting, talk to your doctor about stop-smoking programs and medicines. These can increase your chances of quitting for good.  Where can you learn more?  Go to [facility] at [email or web address]  [facility] under the Resources menu. Enter [phone] in the search box to learn more about Kegel Exercises: Care Instructions.  Current as of: March 09, 2017  Content Version: 12.2  ?? 2006-2019 [employer]. Care instructions adapted under license by [facility]. If you have questions about a medical condition or this instruction, always ask your healthcare professional. [employer] disclaims any warranty or liability for your use of this information.

## 2018-01-12 ENCOUNTER — Ambulatory Visit: Admit: 2018-01-12 | Discharge: 2018-01-13 | Payer: MEDICARE

## 2018-01-12 ENCOUNTER — Ambulatory Visit
Admit: 2018-01-12 | Discharge: 2018-01-13 | Payer: MEDICARE | Attending: Obstetrics & Gynecology | Primary: Obstetrics & Gynecology

## 2018-01-12 DIAGNOSIS — N814 Uterovaginal prolapse, unspecified: Principal | ICD-10-CM

## 2018-01-12 NOTE — Unmapped (Signed)
[facility] Refill Coordination Note    Specialty Medication(s) to be Shipped:   CF/Pulmonary: -[name] (ivacaftor) 150mg  tablet    Other medication(s) to be shipped: n/a     Alyssa Willis, DOB: January 15, 1946  Phone: [phone] (home)       All above HIPAA information was verified with patient's family member.     Completed refill call assessment today to schedule patient's medication shipment from the [facility] [phone]).       Specialty medication(s) and dose(s) confirmed: Regimen is correct and unchanged.   Changes to medications: [name] reports no changes reported at this time.  Changes to insurance: Yes: Healthwell grant expired. Working on getting copay assistance through [employer]  Questions for the pharmacist: No    The patient will receive a drug information handout for each medication shipped and additional FDA Medication Guides as required.      DISEASE/MEDICATION-SPECIFIC INFORMATION        For CF patients: CF Healthwell Grant Active? Yes but funds have been depleted    ADHERENCE     Medication Adherence    Patient reported X missed doses in the last month:  0  Specialty Medication:  Kalydeoco 150 ([place]: 6 days)   Patient is on additional specialty medications:  No  Patient is on more than two specialty medications:  No  Any gaps in refill history greater than 2 weeks in the last 3 months:  no  Demonstrates understanding of importance of adherence:  no  Informant:  spouse  Reliability of informant:  reliable          Support network for adherence:  family member              Refill Coordination    Has the Patients' Contact Information Changed:  No  Is the Shipping Address Different:  No         MEDICARE PART B DOCUMENTATION     Not Applicable    SHIPPING     Shipping address confirmed in Epic.     Delivery Scheduled: Yes, Expected medication delivery date: 01/17/18 via UPS or courier. Patient requested signature on delivery.     Medication will be delivered via UPS to the home address in Epic WAM.    Julianne Rice   [facility] Specialty Pharmacist

## 2018-01-16 MED FILL — KALYDECO 150 MG TABLET: 28 days supply | Qty: 56 | Fill #2 | Status: AC

## 2018-01-16 MED FILL — KALYDECO 150 MG TABLET: 28 days supply | Qty: 56 | Fill #2

## 2018-01-20 NOTE — Unmapped (Signed)
[facility]  Return Patient Evaluation & Consultation    Date: 01/12/2018          Referring Provider: Referred Self  Primary Care Physician: Danella Penton, MD    Subjective:     Chief Complaint: Vaginal bulging    History of Present Illness:  Alyssa Willis is a 72 y.o. 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  The pt was last seen on 07/08/2016.    Currently, the pt is inserting and removing the pessary roughly once every 3 weeks.  Admits that her pessary is relieving her bulge symptoms.  Denies vaginal bleeding or discharge.  Does admit to some discomfort with pessary insertion because she cannot keep the pessary folded because, I put jelly all over it and it is very slippery.  Overall, the pt is satisfied with her pessary for management of her POP.      Obstetric History:  OB History     Gravida   0    Para   0    Term   0    Preterm   0    AB   0    Living   0       SAB   0    TAB   0    Ectopic   0    Molar        Multiple   0    Live Births              Obstetric Comments   OB:  G0    GYN:  PMP since 72 yo  no vaginal bleeding  Sexually active:  No  Abnl Paps:  No  Last Pap:  Unsure                 Past Medical History:   Patient  has a past medical history of Cervical disc disease, Chronic kidney disease (CKD), stage I, Chronic sinusitis, Cystic fibrosis (CMS-HCC), Dry eyes, Gene mutation, GERD (gastroesophageal reflux disease), Headache(784.0), History of facelift, Hyperlipidemia, Hypertension, Mycobacterium avium complex (CMS-HCC), and Osteoporosis.     Past Surgical History:  She  has a past surgical history that includes Lacrimal duct probing w/ stent placement (1970); LASIK (Bilateral, 03/2002); Adenoidectomy; S/P Lasik procedure; Tonsillectomy; Bronchoscopy; pr colonoscopy flx dx w/collj spec when pfrmd (N/A, 05/02/2013); pr upper gi endoscopy,diagnosis (N/A, 05/02/2013); S/P face lift; pr colsc flx with directed submucosal njx any sbst (N/A, 11/11/2015); pr colsc flx w/rmvl of tumor polyp lesion snare tq (N/A, 11/11/2015); pr remv cataract extracap,insert lens (Right, 12/16/2015); pr remv cataract extracap,insert lens (Left, 05/25/2016); pr colsc flx w/rmvl of tumor polyp lesion snare tq (N/A, 01/05/2017); pr colsc flx w/removal lesion by hot bx forceps (N/A, 01/05/2017); and pr colonoscopy w/biopsy single/multiple (N/A, 04/06/2017).     Medications:    Current Outpatient Medications:   ???  albuterol (PROVENTIL HFA;VENTOLIN HFA) 90 mcg/actuation inhaler, Inhale 2 puffs every six (6) hours as needed for wheezing (and before hypertonic saline). Please dispense only Proventil HFA., Disp: 3 Inhaler, Rfl: 4  ???  CALCIUM CARBONATE (CALCIUM 500 ORAL), Take 1,000 mg by mouth daily., Disp: , Rfl:   ???  cholecalciferol, vitamin D3, 5,000 unit Tab, Take 5,000 Int'l Units by mouth daily., Disp: , Rfl:   ???  enalapril (VASOTEC) 5 MG tablet, Take 2.5 mg by mouth  daily. , Disp: , Rfl:   ???  ivacaftor 150 mg Tab, TAKE 1 TABLET BY MOUTH WITH FATTY FOOD EVERY TWELVE HOURS, Disp: 56 tablet, Rfl: 11  ???  metoprolol succinate (TOPROL-XL) 25 MG 24 hr tablet, Take 25 mg by mouth Two (2) times a day. , Disp: , Rfl:   ???  multivit-min-FA-lycopen-lutein (CENTRUM SILVER) 0.4-300-250 mg-mcg-mcg Tab, Take 1 tablet by mouth daily at 0600. Frequency:QD   Dosage:0.0     Instructions:  Note:Dose: 1 TAB, Disp: , Rfl:   ???  nebulizer accessories (REUSABLE NEBULIZER KIT) Kit, Please dispense one Pari LC Plus reusable nebulizer cup to be used with inhaled medications (hypertonic saline). Dx Code: J47.9, E84.0, Disp: 1 kit, Rfl: 2  ???  omeprazole (PRILOSEC) 40 MG capsule, Take 40 mg by mouth daily., Disp: , Rfl:   ???  PARI LC D NEBULIZER Misc, Provide 1 device  for use with inhaled medication., Disp: 3 each, Rfl: 3  ???  ranitidine (ZANTAC) 300 MG capsule, Take 1 capsule (300 mg total) by mouth every evening., Disp: 90 capsule, Rfl: 0  ???  risedronate (ACTONEL) 150 MG tablet, Take 150 mg by mouth every thirty (30) days. with water on empty stomach, nothing by mouth or lie down for next 30 minutes., Disp: , Rfl:   ???  simvastatin (ZOCOR) 80 MG tablet, Take 80 mg by mouth nightly., Disp: , Rfl:   ???  sodium chloride 10 % Nebu, INHALE VIA NEBULIZER TWICE DAILY, Disp: 750 mL, Rfl: 3  ???  temAZEpam (RESTORIL) 15 mg capsule, Take 15 mg by mouth., Disp: , Rfl:   ???  tretinoin (ALTRALIN) 0.05 % gel, Apply 1 application topically nightly. As needed, Disp: , Rfl:   ???  carboxymethylcellulose (REFRESH PLUS) 0.5 % Dpet, 1 drop Three (3) times a day as needed., Disp: , Rfl:   ???  cyproheptadine (PERIACTIN) 4 mg tablet, Take 1 tablet (4 mg total) by mouth Three (3) times a day. (Patient not taking: Reported on 10/17/2017), Disp: 30 tablet, Rfl: 0  ???  doxycycline (VIBRA-TABS) 100 MG tablet, Take 1 tablet (100 mg total) by mouth Two (2) times a day. QD (Patient not taking: Reported on 04/06/2017), Disp: 180 tablet, Rfl: 1    Allergies:  Patient is allergic to tobramycin; tobi [tobramycin in 0.225 % nacl]; codeine; galantamine; and hydrocodone.     Family History:  family history includes Alzheimer's disease in her mother; Cancer in her maternal grandmother; Colon cancer in her maternal grandmother.     Social History:  Patient  reports that she has never smoked. She has never used smokeless tobacco. She reports current alcohol use of about 1.0 standard drinks of alcohol per week. She reports that she does not use drugs.     Review of Systems:  A full 10 system review of systems was negative except as noted in the History of Present Illness.      Objective:     Physical Exam:  BP 113/56  - Pulse 72  - Resp 20  - Wt 56.9 kg (125 lb 6.4 oz)  - BMI 20.87 kg/m??   Constitutional:  General appearance: Well nourished, well developed female in no acute distress.   Psych:  Normal mood and affect  Neuro:  Alert and oriented x 3    GI:  The abdomen was soft, NT, ND, no evidence of organomegaly, masses, or hernias.  Normal active bowel sounds noted.    BACK:  No evidence of CVAT.  Normal spinal curvature    Detailed Urogynecologic Evaluation:     PELVIC: Atrophic, otherwise normal appearing external female genitalia. Bartholin's and Skene's are normal. Urethra normal in appearance and midline. The vagina and cuff were without lesions or discharge. No ulcers or erythematous changes seen. Minimal, superficial irritation seen at the posterior apex    BME:  No cuff tenderness, Cuff intact.   No pelvic masses or tenderness noted.    RVE:  Deferred  Lateral walls detached      POP-Q Exams 10/07/2014   gh 4   pb 3   tvl 8   aa 3   ba 4   ap -1   bp -1   c -2   d -4.5      Assessment/Plan:      Assessment:  This is a 72 y.o. 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  The pt does, however have evidence on exam today of minimal, superficial irritation seen at the posterior apex.  Again, she reports she is removing her pessary roughly every 3 weeks.  She shares that she infrequently removes the pessary as it is uncomfortable to insert given it is so slippery going in.  We reviewed that, rather than putting lubrication jelly all over the pessary, the pt could focus the jelly to the leading edge of the pessary at time of insertion and, likely, insertion would be much more comfortable and she would more likely be able to keep the pessary folded.    As such, the pt will proceed as follows:    Plan:  1. Being careful with lifting (try to keep lifting to 10-20 lbs or less)  2. Kegel exercises (10-12 squeezes in a row, 4-5 times daily, for example, do these at breakfast, lunch, dinner, and before bedtime)  3.  We discussed you removing the pessary and leaving it out over night 1 time per week  4.  We discussed you using 1 gm of estrogen cream in the vagina over night on the night you leave out the pessary  5.  When inserting / removing the pessary, we discussed applying some KY jelly or coconut oil at the vaginal opening to facilitate insertion / removal of the pessary  6.  We discussed that when inserting the pessary, fold it in half  and only apply jelly at the front end   7.  We will see you back in 3 months to see how you are doing.    Time Spent:    Roughly 25 minutes was spent in face-to-face discussion with the pt today, >50% of today's visit,  reviewing previous history, physical exam, new questions raised by the pt today related to her health concerns, and management options.    All questions were answered to the patient's satisfaction.

## 2018-01-31 NOTE — Unmapped (Deleted)
Cystic Fibrosis Clinic Visit:    Assessment:   Alyssa Willis is a 72 y.o. female with cystic fibrosis who presents today for a well visit; She is eligible for Symdeko based on her mutations, but is doing so well on [facility] that I see no reason to change at this point.  In general she remains very stable.  Her lung function is excellent for age.      Problem List Items Addressed This Visit     None        Plan:   1) needs annual lab check  2)   3) annual visits with Social Work, Scientific laboratory technician, Pharmacy and Respiratory therapy  4) regarding her poor appetite and weight loss, I am highly suspicious that this is manifestation of depression.  Her routine screening for cancers has been unremarkable.  Alyssa Willis is reluctant to increase her dose of Zoloft, which she states she has taken for hot flashes.  She is agreeable to an appetite stimulant and therefore I  prescribed Periactin which she will take 4 mg 3 times daily  5)     Recommended that she be seen quarterly per CFF guidelines  Follow-up planned in 10-12 weeks or sooner if clinically indicated    Subjective:   Alyssa Willis is a 72 y.o. 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  Her last visit with me was in December 2019.  Since starting [name] ~5 years ago, her health has been very stable; she has not had any cough and only very rarely has clear sputum production. She rarely uses her HS or VEST, maybe twice a month.  She reports that she can go for days without coughing.  She denies any problems with constipation or oily stool.      In the interim since her last visit, she had her colonoscopy in October.  Adenomatous polyps were found, but because of colonic spasm and poor visualization, she needs a repeat in Jan.     CF Overview:     Genetics: R117H/5T and 3272-26 A-->G, on [facility] since Feb 2015    Airway Clearance Regimen: VEST, HS on occasion    Exercise Habits: Exercise capacity is rated as normal and is felt to be unchanged compared to last visit. Plans on going to a gym but has not yet started. Gave away her treadmill.     Inhaled ABX: no longer using Cayston    The most recent course of IV antibiotics was in October 2013.    The most recent course of oral antibiotics was in June 2015.    Typical bacteria cultured include smooth and mucoid pseudomonas; she has also isolated mycobacterium avium complex repeatedly but does not have symptoms of NTM infection.The patient judges that exacerbations requiring any antibiotic intervention (oral, inhaled, IV) are occurring 0 times per year.    Hemoptysis: no  ABPA:  no  Ptx:  no  Sinus symptoms (congestion, rhinorrhea, pain) are absent and this is felt to be the same compared to last visit.     Panc Insuf: no  PEG:  no  DIOS:  no  CF Liver Dz:   no  Patient is having approximately 1 stool per day.  Stools are described as normal in appearance.      Diabetes: No  Oral glucose tolerance test - 07/22/16  Fasting: 95  2 hour: 89    Osteopenia: Yes, based on DXA 01/09/16; states  she has not been on her alendronate for many months now, but doesn't know why  DXA findings:  FINDINGS: The bone mineral density in the spine measuring L1 to 4 measures 0.889 gm/cm2. ??The ??Z score is 0.8 and the T score is -1.4. ??This value is above the fracture risk threshold. ??This represents an insignificant increase of 1.5% when compared with a previous measurement of 0.876 gm/cm2.    The total bone mineral density in the proximal left femur measures 0.603 gm/cm2. ??The Z score is -1.2 and the T score is -2.8. ??This represents an insignificant decrease of 1.8% when compared with the previous measurement of 0.614 gm/cm2. ??The femoral neck density is 0.458 gm/cm2, and the femoral neck T score is -3.5. ??The other T scores range from -2.7 to -2.2. ??These values are below the fracture risk thresholds.    ??   Impression     The femoral density indicates osteoporosis, but the spine density indicates osteopenia. ??The measurements have not changed significantly since prior examination.     Depression: Yes; had a good relationship with our prior SW; has not engaged with our new social workers but met with [name] today.  Adherence to medications and airway clearance is rated as Poor.      Medications:  Current Outpatient Medications on File Prior to Visit   Medication Sig Dispense Refill   ??? albuterol (PROVENTIL HFA;VENTOLIN HFA) 90 mcg/actuation inhaler Inhale 2 puffs every six (6) hours as needed for wheezing (and before hypertonic saline). Please dispense only Proventil HFA. 3 Inhaler 4   ??? CALCIUM CARBONATE (CALCIUM 500 ORAL) Take 1,000 mg by mouth daily.     ??? carboxymethylcellulose (REFRESH PLUS) 0.5 % Dpet 1 drop Three (3) times a day as needed.     ??? cholecalciferol, vitamin D3, 5,000 unit Tab Take 5,000 Int'l Units by mouth daily.     ??? cyproheptadine (PERIACTIN) 4 mg tablet Take 1 tablet (4 mg total) by mouth Three (3) times a day. (Patient not taking: Reported on 10/17/2017) 30 tablet 0   ??? doxycycline (VIBRA-TABS) 100 MG tablet Take 1 tablet (100 mg total) by mouth Two (2) times a day. QD (Patient not taking: Reported on 04/06/2017) 180 tablet 1   ??? enalapril (VASOTEC) 5 MG tablet Take 2.5 mg by mouth daily.      ??? ivacaftor 150 mg Tab TAKE 1 TABLET BY MOUTH WITH FATTY FOOD EVERY TWELVE HOURS 56 tablet 11   ??? metoprolol succinate (TOPROL-XL) 25 MG 24 hr tablet Take 25 mg by mouth Two (2) times a day.      ??? multivit-min-FA-lycopen-lutein (CENTRUM SILVER) 0.4-300-250 mg-mcg-mcg Tab Take 1 tablet by mouth daily at 0600. Frequency:QD   Dosage:0.0     Instructions:  Note:Dose: 1 TAB     ??? nebulizer accessories (REUSABLE NEBULIZER KIT) Kit Please dispense one Pari LC Plus reusable nebulizer cup to be used with inhaled medications (hypertonic saline). Dx Code: J47.9, E84.0 1 kit 2   ??? omeprazole (PRILOSEC) 40 MG capsule Take 40 mg by mouth daily.     ??? PARI LC D NEBULIZER Misc Provide 1 device  for use with inhaled medication. 3 each 3   ??? ranitidine (ZANTAC) 300 MG capsule Take 1 capsule (300 mg total) by mouth every evening. 90 capsule 0   ??? risedronate (ACTONEL) 150 MG tablet Take 150 mg by mouth every thirty (30) days. with water on empty stomach, nothing by mouth or lie down for next 30 minutes.     ???  simvastatin (ZOCOR) 80 MG tablet Take 80 mg by mouth nightly.     ??? sodium chloride 10 % Nebu INHALE VIA NEBULIZER TWICE DAILY 750 mL 3   ??? temAZEpam (RESTORIL) 15 mg capsule Take 15 mg by mouth.     ??? tretinoin (ALTRALIN) 0.05 % gel Apply 1 application topically nightly. As needed       No current facility-administered medications on file prior to visit.      The patient does not use Pulmozyme due to hemoptysis.  She will also not use TOBI in the future due to wheezing that occurs when she inhales this drug.    Allergies:  Allergies   Allergen Reactions   ??? Tobramycin Shortness Of Breath     wheezing   ??? Tobi [Tobramycin In 0.225 % Nacl] Other (See Comments)     wheezing  wheezing   ??? Codeine Itching and Nausea Only   ??? Galantamine Nausea Only   ??? Hydrocodone Itching       Past Medical History:  Past Medical History:   Diagnosis Date   ??? Cervical disc disease    ??? Chronic kidney disease (CKD), stage I    ??? Chronic sinusitis    ??? Cystic fibrosis (CMS-HCC)    ??? Dry eyes    ??? 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 GERD (gastroesophageal reflux disease)    ??? Headache(784.0)    ??? History of facelift    ??? Hyperlipidemia    ??? Hypertension    ??? Mycobacterium avium complex (CMS-HCC)    ??? Osteoporosis      Past Surgical History:  Past Surgical History:   Procedure Laterality Date   ??? ADENOIDECTOMY     ??? BRONCHOSCOPY     ??? LACRIMAL DUCT PROBING W/ STENT PLACEMENT  1970   ??? LASIK Bilateral 03/2002    Dr. Gershon Crane - preoperative RX was -4.75 -0.75 x 170 OD and -4.75 -0.75 x 180 OS   ??? PR COLONOSCOPY FLX DX W/COLLJ SPEC WHEN PFRMD N/A 05/02/2013    Procedure: COLONOSCOPY, FLEXIBLE, PROXIMAL TO SPLENIC FLEXURE; DIAGNOSTIC, W/WO COLLECTION SPECIMEN BY BRUSH OR WASH;  Surgeon: Clint Bolder, MD;  [facility];  Service: Gastroenterology   ??? PR COLONOSCOPY W/BIOPSY SINGLE/MULTIPLE N/A 04/06/2017    Procedure: COLONOSCOPY, FLEXIBLE, PROXIMAL TO SPLENIC FLEXURE; WITH BIOPSY, SINGLE OR MULTIPLE;  Surgeon: Jolyn Lent, MD;  [facility];  Service: Gastroenterology   ??? PR COLSC FLX W/REMOVAL LESION BY HOT BX FORCEPS N/A 01/05/2017    Procedure: COLONOSCOPY, FLEXIBLE, PROXIMAL TO SPLENIC FLEXURE; W/REMOVAL TUMOR/POLYP/OTHER LESION, HOT BX FORCEP/CAUTE;  Surgeon: Jolyn Lent, MD;  Location: [facility];  Service: Gastroenterology   ??? PR COLSC FLX W/RMVL OF TUMOR POLYP LESION SNARE TQ N/A 11/11/2015    Procedure: COLONOSCOPY FLEX; W/REMOV TUMOR/LES BY SNARE;  Surgeon: Jolyn Lent, MD;  Location: [facility];  Service: Gastroenterology   ??? PR COLSC FLX W/RMVL OF TUMOR POLYP LESION SNARE TQ N/A 01/05/2017    Procedure: COLONOSCOPY FLEX; W/REMOV TUMOR/LES BY SNARE;  Surgeon: Jolyn Lent, MD;  [facility];  Service: Gastroenterology   ??? PR COLSC FLX WITH DIRECTED SUBMUCOSAL NJX ANY SBST N/A 11/11/2015    Procedure: COLONOSCOPY, FLEXIBLE, PROXIMAL TO SPLENIC FLEXURE; WITH DIRECTED SUBMUCOSAL INJECTION(S), ANY SUBSTANCE;  Surgeon: Jolyn Lent, MD;  Location: [facility];  Service: Gastroenterology   ???  PR REMV CATARACT EXTRACAP,INSERT LENS Right 12/16/2015    Procedure: EXTRACAPSULAR CATARACT REMOVAL W/INSERTION OF INTRAOCULAR LENS PROSTHESIS, MANUAL OR MECHANICAL TECHNIQUE;  Surgeon: Earlie Counts, MD;  [facility];  Service: Ophthalmology   ??? PR REMV CATARACT EXTRACAP,INSERT LENS Left 05/25/2016    Procedure: EXTRACAPSULAR CATARACT REMOVAL W/INSERTION OF INTRAOCULAR LENS PROSTHESIS, MANUAL OR MECHANICAL TECHNIQUE; Surgeon: Earlie Counts, MD;  [facility];  Service: Ophthalmology   ??? PR UPPER GI ENDOSCOPY,DIAGNOSIS N/A 05/02/2013    Procedure: UGI ENDO, INCLUDE ESOPHAGUS, STOMACH, & DUODENUM &/OR JEJUNUM; DX W/WO COLLECTION SPECIMN, BY BRUSH OR WASH;  Surgeon: Clint Bolder, MD;  [facility];  Service: Gastroenterology   ??? S/P face lift     ??? S/P Lasik procedure     ??? TONSILLECTOMY       Social History:  Patient lives with a spouse and dogs in [place]. No tobacco, EtOH or illicit drug use    Family History:  Family History   Problem Relation Age of Onset   ??? Alzheimer's disease Mother    ??? Colon cancer Maternal Grandmother    ??? Cancer Maternal Grandmother         colon     Review of Systems:  A 12 point review of systems was negative except for pertinent items noted in the HPI.        Objective:   Vitals Signs: There were no vitals taken for this visit.    Wt Readings from Last 3 Encounters:   01/12/18 56.9 kg (125 lb 6.4 oz)   04/06/17 54.4 kg (120 lb)   03/03/17 50.8 kg (112 lb)       Ht Readings from Last 3 Encounters:   04/06/17 165.1 cm (5' 5)   03/03/17 162.6 cm (5' 4)   01/05/17 163 cm (5' 4.17)     General appearance:  Alert, cooperative, no distress  Head:  Normocephalic, atraumatic.    Eyes: PERRL, EOM's intact, conjunctiva/corneas clear, dry eyes   Nose: Nares normal, septum midline, muscosa normal and no drainage or sinus tenderness  Throat: oropharynx pink and moist without erythema or exudate  Neck: Supple and trachea midline  Lungs: Good breath sounds bilaterally. Respirations unlabored.  No wheezes or rhonchi.  Lungs clear to auscultation.   Chest:  No chest wall tenderness.  Heart: Regular rate and rhythm, S1 and S2 Normal. No murmur, rub or gallop  Adbomen: Soft, non-tender and non-distended with normoactive bowel sounds  Musculoskeletal:  No cyanosis, clubbing or edema  Pulses: 2+ and symmetric in all extremities  Skin: No rashes  Lymph Nodes: no palpable lymphadenopathy  Neurological:  No focal neurological deficits.  Milder degree of inattention on exam compared to previous visit    Pulmonary Function Testing Results:    Marland Kitchen    Date FVC (%) FEV1 (%) Ratio FEF 25:75 (%) Comments   02/02/18        03/03/17 2.52 (86%) 1.87 (84%) 74 1.44 (79%)    07/15/16 2.45 (83%) 1.84 (83%) 75 1.38 (76%)    03/27/15 2.5L (85%) 2.09L (90%) 80 2.2L (114%)      I reviewed interval medical records.    Health Care Maintenance:  CF Annual Labs  LABS   CBC  Lab Results   Component Value Date    WBC 7.4 07/22/2016    HGB 13.6 07/22/2016    HCT 42.2 07/22/2016    PLT 273 07/22/2016  ELECTROLYTES  Lab Results   Component Value Date    Sodium 138 07/22/2016    Sodium 143 06/12/2014     Lab Results   Component Value Date    Potassium 4.9 07/22/2016    Potassium 5.2 (H) 06/12/2014     Lab Results   Component Value Date    Chloride 103 07/22/2016    Chloride 107 06/12/2014     Lab Results   Component Value Date    CO2 23.0 07/22/2016    CO2 24 06/12/2014     Lab Results   Component Value Date    BUN/H 12 01/07/2012    BUN 26 (H) 07/22/2016    BUN 20 06/12/2014     Lab Results   Component Value Date    Creatinine 0.97 07/22/2016    Creatinine 0.80 06/12/2014     Lab Results   Component Value Date    Glucose 95 07/22/2016    Glucose 91 06/12/2014     Lab Results   Component Value Date    Calcium 10.1 07/22/2016    Calcium 10.0 06/12/2014     Lab Results   Component Value Date    Anion Gap 12 07/22/2016    Anion Gap 12 06/12/2014       GI  Lab Results   Component Value Date    AST 26 07/22/2016    AST 38 06/12/2014     Lab Results   Component Value Date    ALT 16 07/22/2016    ALT 32 06/12/2014     Lab Results   Component Value Date    Alkaline Phosphatase 117 07/22/2016    Alkaline Phosphatase 89 06/12/2014     Lab Results   Component Value Date    Total Protein 7.2 07/22/2016    Total Protein 7.1 06/12/2014     Lab Results   Component Value Date    Albumin 4.5 07/22/2016    Albumin 4.7 06/12/2014 No results found for: GGT  Lab Results   Component Value Date    Total Bilirubin 0.5 07/22/2016    Total Bilirubin 1.0 06/12/2014       VITAMIN LEVELS / INR:    Lab Results   Component Value Date    Vitamin A 66.3 07/22/2016     Lab Results   Component Value Date    VIT D2 (25OH) <5 03/28/2015    VIT D2 (25OH) <5 06/12/2014     Lab Results   Component Value Date    VIT D3 (25OH) 32 03/28/2015    VIT D3 (25OH) 29 06/12/2014     Lab Results   Component Value Date    Vitamin D Total (25OH) 32 03/28/2015    Vitamin D Total (25OH) 29 06/12/2014     Lab Results   Component Value Date    Vitamin E 22.0 (H) 07/22/2016       Lab Results   Component Value Date    PT 11.1 07/22/2016    PT 10.7 03/01/2011     Lab Results   Component Value Date    INR 0.95 07/22/2016    INR 0.9 06/12/2014       IGE  Lab Results   Component Value Date    IgE, Total <2.00 (L) 07/22/2016    IgE, Total <2.00 (L) 03/28/2015    IgE, Total <2.00 06/12/2014    IgE, Total <2.00 04/16/2013    IgE, Total <2.0 09/01/2012  CULTURES  Lab Results   Component Value Date    CF Sputum Culture 4+ Oropharyngeal Flora Isolated 03/03/2017    CF Sputum Culture 3+ Mucoid Pseudomonas aeruginosa (A) 03/03/2017    CF Sputum Culture 3+ Smooth Pseudomonas aeruginosa (A) 03/03/2017    CF Sputum Culture 3+ OROPHARYNGEAL FLORA ISOLATED  :   (A) 06/06/2014    CF Sputum Culture 1+ 06/06/2014    CF Sputum Culture 1+ 06/06/2014     Lab Results   Component Value Date    Organism Mucoid Pseudomonas aeruginosa (A) 06/06/2014    Organism Smooth Pseudomonas aeruginosa (A) 06/06/2014     CT 04/18/15  FINDINGS:   No lymphadenopathy in the chest. Calcified mediastinal nodes.   Heart size normal. ??No pericardial effusion. ??Scattered atherosclerotic calcifications in the aorta and coronary arteries.  Unchanged severe volume loss and scarring of the entire right upper lobe. ??Mild diffuse bronchiectasis, similar to prior with mucous impacted airways. ??Scattered micronodular opacities in the right lower lobe right middle lobe, and lingula. ??No focal consolidation.  No pleural effusion or pneumothorax.   Partially imaged upper abdomen is normal.   No acute or worrisome osseous lesions.   IMPRESSION: Findings compatible with history of cystic fibrosis including bronchiectasis, peribronchial thickening, and impacted airways. No focal consolidations.    Screening colonoscopy -   04/06/17 - several sessile polyps, diverticula, internal hemorrhoids    Mammogram 12/08/16 ([facility])  IMPRESSION:??   No mammographic evidence of malignancy. A result letter of this??   screening mammogram will be mailed directly to the patient.??   RECOMMENDATION:??   Screening mammogram in one year. (Code:SM-B-01Y)??   BI-RADS CATEGORY????1: Negative.

## 2018-02-02 NOTE — Unmapped (Signed)
Alyssa Willis had an appt scheduled for today 11/14, but cancelled it 2 hours before the appt time. She appears to have rescheduled for 2 weeks from now.

## 2018-02-02 NOTE — Unmapped (Deleted)
Adult Cystic Fibrosis Clinic Pharmacist Note      Primary pulmonologist: Felicie Morn is a 72 y.o. female with cystic fibrosis (genotype: R117H/5T/3272-26A>G) being seen for annual medication review.    Medication Review    Reviewed the indication, dose, and frequency of each medication with patient.   All medications, allergies, and preferred pharmacy list were updated in EPIC medication profile    Allergies:   Allergies   Allergen Reactions   ??? Tobramycin Shortness Of Breath     wheezing   ??? Tobi [Tobramycin In 0.225 % Nacl] Other (See Comments)     wheezing  wheezing   ??? Codeine Itching and Nausea Only   ??? Galantamine Nausea Only   ??? Hydrocodone Itching       INHALED MEDICATIONS     BRONCHODILATOR  ? Albuterol HFA Inhaler: Q6H PRN.  {PATIENT REPORTED ADHERENCE:63170}  MUCOLYTICS    ? Hypertonic Saline 10%: BID.  {PATIENT REPORTED ADHERENCE:63170}  INHALED ANTIBIOTICS  ? [place] (inhaled aztreonam) TID. {Inhaled Antibiotic Adherence:59627}  OTHER  ? None     CHRONIC MEDICATIONS       CFTR MODULATOR   ? [name] 150mg  tablet: 1 tablet BID. {CFTR MODULATOR ADHERENCE:63171}     ID/ABX   Chronic anti-inflammatory and/or suppressive antibiotics:  ? Ivermectin 3mg  . {Chronic Medication Adherence:58874}   NTM:  ? {NTM:63190::Not applicable (AFB negative)}     FEN/GI     Pancreatic Enzyme:   ? Not applicable (pancreatic sufficient)   Reflux/GERD:  ? Dexlansoprazole 60mg   daily. {Chronic Medication Adherence:58874}   Appetite/Other:   ? Cyproheptadine 4mg  TID. {Chronic Medication Adherence:58874}   Vitamins:   ? Calcium 2 capsule(s) daily  ? Vitamin D3 5000 units daily   {Chronic Medication Adherence:58874}  Gets CF vitamins from: ***     ENDOCRINE/BONE     CFRD:   ? Does not have CFRD  Bone Disease/Other:  ? None  ? Risedronate 150mg  monthly. {Chronic Medication Adherence:58874}     SINUS/ALLERGY      CF Sinus/Allergy:  ? None   ABPA:  ? None     NEURO/PSYCH      Anxiety/Depression:  ? Lorazepam 2mg   {DOSE FREQUENCY:51861}. {Chronic Medication Adherence:58874}  ? Temazepam 15mg  {DOSE FREQUENCY:51861}. {Chronic Medication Adherence:58874}     OTHER   Aspirin 81mg  {Chronic Medication Adherence:58874}   Refresh eye drops  Fluorometholone eye drops  Voltaren gel  Enalapril 2.5mg    Galantamine 4mg   Norco 10/325  Meloxicam 15mg   Metoprolol succinate 25mg      Patient identified barriers to adherence:  {Barriers to Adherence:58875}    Reported Side Effects: {Blank single:19197::Yes: ***,None}     Objective Data     Height and Weight:            Lung function:   Date FEV1   03/03/17 84.3   07/15/16 82.9   *** ***     Sputum culture history:   03/08/17: Mucoid Pseudomonas aeruginosa, Smooth Pseudomonas aeruginosa, OPF  06/25/16: Mucoid Pseudomonas aeruginosa, Smooth Pseudomonas aeruginosa, OPF  ***: {SPUTUM CULTURE:55984}    Last AFB completed:   03/03/17  M. avium complex    Exacerbations:  Mon/Yr (Abx)  2019   ***    Last Vitamin Levels:   07/22/16 : A 66.74mcg/dL; E 04.5 mg/L  4/0/98 : D (25OH) 32 ng/mL    Last OGTT:   07/22/16: WNL    Last LFTs:  Lab Results   Component Value Date  ALKPHOS 117 07/22/2016    BILITOT 0.5 07/22/2016    BILIDIR 0.3 04/02/2014    PROT 7.2 07/22/2016    ALBUMIN 4.5 07/22/2016    ALT 16 07/22/2016    AST 26 07/22/2016       Assessment/Recommendations     CYSTIC FIBROSIS {Blank single:19197::WITH PULMONARY INVOLVEMENT,WITHOUT PULMONARY INVOLVEMENT,***}:   {LUNG SEVERITY:63179}  {CF A/P UEAVWUJ:81191}     ID/ABX:   {ID/ABX NOTE A/P:63182}    FEN/GI:   {FEN/GI NOTE A/P:63183}    ENDOCRINE:  {ENDOCRINE/BONE NOTE A/P:63186}    NEURO/PSYCH:  {NEURO/PSYCH NOTE Y/N:82956}    Pertinent drug Interactions noted: {Blank single:19197::Yes-***,No}   Recommended labs to be monitored: {Blank single:19197::Yes-***,No}     Medication management:  Adherence: {Adherence Assessment:57343}  Understands how to refill medications and all assistance programs: {Blank single:19197::Yes,No} Access of CF Medications   {CF SPECIALTY MEDICATIONS & OZHYQMVH:84696}    Eligible Copay Assistance Program(s):   {CF COPAY ASSISTANCE EXBMWUXL:24401}    Summary of Chronic Medication Changes:  ***    Time spent with patient: {Blank single:19197::20 minutes,30 minutes,60 minutes}    Anell Barr, PharmD, BCPS, CPP  Clinical Pharmacist Practitioner  [facility]  Pager: [phone]    I, Edward Qualia, scribed this note in the presence of Donzetta Kohut, CPP.

## 2018-02-09 MED FILL — KALYDECO 150 MG TABLET: 28 days supply | Qty: 56 | Fill #3

## 2018-02-09 MED FILL — KALYDECO 150 MG TABLET: 28 days supply | Qty: 56 | Fill #3 | Status: AC

## 2018-02-09 NOTE — Unmapped (Signed)
[facility] Refill Coordination Note    Specialty Medication(s) to be Shipped:   CF/Pulmonary: -[name] (ivacaftor) 150mg  tablet    Other medication(s) to be shipped: none     Alyssa Willis, DOB: 19-Oct-1945  Phone: [phone] (home)       All above HIPAA information was verified with patient's family member.     Completed refill call assessment today to schedule patient's medication shipment from the [facility] [phone]).       Specialty medication(s) and dose(s) confirmed: Regimen is correct and unchanged.   Changes to medications: Alyssa Willis reports no changes reported at this time.  Changes to insurance: No  Questions for the pharmacist: No    The patient will receive a drug information handout for each medication shipped and additional FDA Medication Guides as required.      DISEASE/MEDICATION-SPECIFIC INFORMATION        For CF patients: CF [employer] Active? Yes    ADHERENCE     Medication Adherence            Support network for adherence:  family member                  MEDICARE PART B DOCUMENTATION         SHIPPING     Shipping address confirmed in Epic.     Delivery Scheduled: Yes, Expected medication delivery date: 02/10/18 via UPS or courier.     Medication will be delivered via Next Day Courier to the home address in [place]. - SIGNATURE IS REQUIRED    Alyssa Willis   [facility] Specialty Pharmacist

## 2018-02-23 NOTE — Unmapped (Deleted)
Cystic Fibrosis Clinic Visit:    Assessment:   Alyssa Willis is a 72 y.o. female with cystic fibrosis who presents today for a well visit; She is eligible for Symdeko based on her mutations, but is doing so well on [facility] that I see no reason to change at this point. She is not eligible for Trikafta.  In general she remains very stable.  Her lung function is excellent for age.      Problem List Items Addressed This Visit     None        Plan:   1) needs annual lab check  2)   3) annual visits with Social Work, Scientific laboratory technician, Pharmacy and Respiratory therapy  4) regarding her poor appetite and weight loss, I am highly suspicious that this is manifestation of depression.  Her routine screening for cancers has been unremarkable.  Alyssa Willis is reluctant to increase her dose of Zoloft, which she states she has taken for hot flashes.  She is agreeable to an appetite stimulant and therefore I  prescribed Periactin which she will take 4 mg 3 times daily  5)     Recommended that she be seen quarterly per CFF guidelines  Follow-up planned in 10-12 weeks or sooner if clinically indicated    Subjective:   Alyssa Willis is a 72 y.o. 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  Her last visit with me was in December 2019.  Since starting [name] ~5 years ago, her health has been very stable; she has not had any cough and only very rarely has clear sputum production. She rarely uses her HS or VEST, maybe twice a month.  She reports that she can go for days without coughing.  She denies any problems with constipation or oily stool.      In the interim since her last visit, she had her repeat colonoscopy in January with an additional 3 polyps removed. Path was unremarkable.     CF Overview:     Genetics: R117H/5T and 3272-26 A-->G, on [name] since Feb 2015    Airway Clearance Regimen: VEST, HS on occasion    Exercise Habits: Exercise capacity is rated as normal and is felt to be unchanged compared to last visit. Plans on going to a gym but has not yet started. Gave away her treadmill.     Inhaled ABX: no longer using Cayston    The most recent course of IV antibiotics was in October 2013.    The most recent course of oral antibiotics was in June 2015.    Typical bacteria cultured include smooth and mucoid pseudomonas; she has also isolated mycobacterium avium complex repeatedly but does not have symptoms of NTM infection.The patient judges that exacerbations requiring any antibiotic intervention (oral, inhaled, IV) are occurring 0 times per year.    Hemoptysis: no  ABPA:  no  Ptx:  no  Sinus symptoms (congestion, rhinorrhea, pain) are absent and this is felt to be the same compared to last visit.     Panc Insuf: no  PEG:  no  DIOS:  no  CF Liver Dz:   no  Patient is having approximately 1 stool per day.  Stools are described as normal in appearance.      Diabetes: No  Oral glucose tolerance test - 07/22/16  Fasting: 95  2 hour: 89    Osteopenia: Yes, based on DXA 01/09/16; states she has not  been on her alendronate for many months now, but doesn't know why  DXA findings:  FINDINGS: The bone mineral density in the spine measuring L1 to 4 measures 0.889 gm/cm2. ??The ??Z score is 0.8 and the T score is -1.4. ??This value is above the fracture risk threshold. ??This represents an insignificant increase of 1.5% when compared with a previous measurement of 0.876 gm/cm2.    The total bone mineral density in the proximal left femur measures 0.603 gm/cm2. ??The Z score is -1.2 and the T score is -2.8. ??This represents an insignificant decrease of 1.8% when compared with the previous measurement of 0.614 gm/cm2. ??The femoral neck density is 0.458 gm/cm2, and the femoral neck T score is -3.5. ??The other T scores range from -2.7 to -2.2. ??These values are below the fracture risk thresholds.    ??   Impression     The femoral density indicates osteoporosis, but the spine density indicates osteopenia. ??The measurements have not changed significantly since prior examination.     Depression: Yes; had a good relationship with our prior SW; has not engaged with our new social workers but met with [name] today.  Adherence to medications and airway clearance is rated as Poor.      Medications:  Current Outpatient Medications on File Prior to Visit   Medication Sig Dispense Refill   ??? albuterol (PROVENTIL HFA;VENTOLIN HFA) 90 mcg/actuation inhaler Inhale 2 puffs every six (6) hours as needed for wheezing (and before hypertonic saline). Please dispense only Proventil HFA. 3 Inhaler 4   ??? aspirin (ECOTRIN) 81 MG tablet Take 81 mg by mouth daily.     ??? aztreonam lysine (CAYSTON) 75 mg/mL Nebu nebulization solution Cayston 75 mg/mL solution for nebulization     ??? CALCIUM CARBONATE (CALCIUM 500 ORAL) Take 1,000 mg by mouth daily.     ??? carboxymethylcellulose (REFRESH PLUS) 0.5 % Dpet 1 drop Three (3) times a day as needed.     ??? cholecalciferol, vitamin D3, 5,000 unit Tab Take 5,000 Int'l Units by mouth daily.     ??? cyproheptadine (PERIACTIN) 4 mg tablet Take 1 tablet (4 mg total) by mouth Three (3) times a day. (Patient not taking: Reported on 10/17/2017) 30 tablet 0   ??? dexlansoprazole (DEXILANT) 60 mg capsule Dexilant 60 mg capsule, delayed release     ??? diclofenac sodium (VOLTAREN) 1 % gel Voltaren 1 % topical gel     ??? enalapril (VASOTEC) 5 MG tablet Take 2.5 mg by mouth daily.      ??? fluorometholone (FML) 0.1 % ophthalmic suspension fluorometholone 0.1 % eye drops,suspension     ??? galantamine (RAZADYNE) 4 MG tablet Take 4 mg by mouth every morning.      ??? HYDROcodone-acetaminophen (NORCO 10-325) 10-325 mg per tablet hydrocodone 10 mg-acetaminophen 325 mg tablet   PRN     ??? ivacaftor 150 mg Tab TAKE 1 TABLET BY MOUTH WITH FATTY FOOD EVERY TWELVE HOURS 56 tablet 11   ??? ivermectin (STROMECTOL) 3 mg Tab ivermectin 3 mg tablet     ??? LORazepam (ATIVAN) 2 MG tablet lorazepam 2 mg tablet     ??? meloxicam (MOBIC) 15 MG tablet Take 15 mg by mouth daily.     ??? metoprolol succinate (TOPROL-XL) 25 MG 24 hr tablet Take 25 mg by mouth Two (2) times a day.      ??? multivit-min-FA-lycopen-lutein (CENTRUM SILVER) 0.4-300-250 mg-mcg-mcg Tab Take 1 tablet by mouth daily at 0600. Frequency:QD   Dosage:0.0  Instructions:  Note:Dose: 1 TAB     ??? nebulizer accessories (REUSABLE NEBULIZER KIT) Kit Please dispense one Pari LC Plus reusable nebulizer cup to be used with inhaled medications (hypertonic saline). Dx Code: J47.9, E84.0 1 kit 2   ??? PARI LC D NEBULIZER Misc Provide 1 device  for use with inhaled medication. 3 each 3   ??? ranitidine (ZANTAC) 300 MG capsule Take 1 capsule (300 mg total) by mouth every evening. 90 capsule 0   ??? risedronate (ACTONEL) 150 MG tablet Take 150 mg by mouth every thirty (30) days. with water on empty stomach, nothing by mouth or lie down for next 30 minutes.     ??? simvastatin (ZOCOR) 80 MG tablet Take 80 mg by mouth nightly.     ??? sodium chloride 10 % Nebu INHALE VIA NEBULIZER TWICE DAILY 750 mL 3   ??? temAZEpam (RESTORIL) 15 mg capsule Take 15 mg by mouth.     ??? tretinoin (ALTRALIN) 0.05 % gel Apply 1 application topically nightly. As needed       No current facility-administered medications on file prior to visit.      The patient does not use Pulmozyme due to hemoptysis.  She will also not use TOBI in the future due to wheezing that occurs when she inhales this drug.    Allergies:  Allergies   Allergen Reactions   ??? Tobramycin Shortness Of Breath     wheezing   ??? Tobi [Tobramycin In 0.225 % Nacl] Other (See Comments)     wheezing  wheezing   ??? Codeine Itching and Nausea Only   ??? Galantamine Nausea Only   ??? Hydrocodone Itching       Past Medical History:  Past Medical History:   Diagnosis Date   ??? Cervical disc disease    ??? Chronic kidney disease (CKD), stage I    ??? Chronic sinusitis    ??? Cystic fibrosis (CMS-HCC)    ??? Dry eyes    ??? 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 GERD (gastroesophageal reflux disease)    ??? Headache(784.0)    ??? History of facelift    ??? Hyperlipidemia    ??? Hypertension    ??? Mycobacterium avium complex (CMS-HCC)    ??? Osteoporosis      Past Surgical History:  Past Surgical History:   Procedure Laterality Date   ??? ADENOIDECTOMY     ??? BRONCHOSCOPY     ??? LACRIMAL DUCT PROBING W/ STENT PLACEMENT  1970   ??? LASIK Bilateral 03/2002    Dr. Gershon Crane - preoperative RX was -4.75 -0.75 x 170 OD and -4.75 -0.75 x 180 OS   ??? PR COLONOSCOPY FLX DX W/COLLJ SPEC WHEN PFRMD N/A 05/02/2013    Procedure: COLONOSCOPY, FLEXIBLE, PROXIMAL TO SPLENIC FLEXURE; DIAGNOSTIC, W/WO COLLECTION SPECIMEN BY BRUSH OR WASH;  Surgeon: Clint Bolder, MD;  [facility];  Service: Gastroenterology   ??? PR COLONOSCOPY W/BIOPSY SINGLE/MULTIPLE N/A 04/06/2017    Procedure: COLONOSCOPY, FLEXIBLE, PROXIMAL TO SPLENIC FLEXURE; WITH BIOPSY, SINGLE OR MULTIPLE;  Surgeon: Jolyn Lent, MD;  Location: [facility];  Service: Gastroenterology   ??? PR COLSC FLX W/REMOVAL LESION BY HOT BX FORCEPS N/A 01/05/2017    Procedure: COLONOSCOPY, FLEXIBLE, PROXIMAL TO SPLENIC FLEXURE; W/REMOVAL TUMOR/POLYP/OTHER LESION, HOT BX FORCEP/CAUTE;  Surgeon: Jolyn Lent, MD;  Location: [facility];  Service: Gastroenterology   ??? PR COLSC FLX W/RMVL OF TUMOR  POLYP LESION SNARE TQ N/A 11/11/2015    Procedure: COLONOSCOPY FLEX; W/REMOV TUMOR/LES BY SNARE;  Surgeon: Jolyn Lent, MD;  Location: [facility] [place] [facility];  Service: Gastroenterology   ??? PR COLSC FLX W/RMVL OF TUMOR POLYP LESION SNARE TQ N/A 01/05/2017    Procedure: COLONOSCOPY FLEX; W/REMOV TUMOR/LES BY SNARE;  Surgeon: Jolyn Lent, MD;  Location: [facility] [facility];  Service: Gastroenterology   ??? PR COLSC FLX WITH DIRECTED SUBMUCOSAL NJX ANY SBST N/A 11/11/2015    Procedure: COLONOSCOPY, FLEXIBLE, PROXIMAL TO SPLENIC FLEXURE; WITH DIRECTED SUBMUCOSAL INJECTION(S), ANY SUBSTANCE;  Surgeon: Jolyn Lent, MD;  Location: [facility] [place] [facility];  Service: Gastroenterology   ??? PR REMV CATARACT EXTRACAP,INSERT LENS Right 12/16/2015    Procedure: EXTRACAPSULAR CATARACT REMOVAL W/INSERTION OF INTRAOCULAR LENS PROSTHESIS, MANUAL OR MECHANICAL TECHNIQUE;  Surgeon: Earlie Counts, MD;  [facility];  Service: Ophthalmology   ??? PR REMV CATARACT EXTRACAP,INSERT LENS Left 05/25/2016    Procedure: EXTRACAPSULAR CATARACT REMOVAL W/INSERTION OF INTRAOCULAR LENS PROSTHESIS, MANUAL OR MECHANICAL TECHNIQUE;  Surgeon: Earlie Counts, MD;  [facility]: [facility] OR [facility];  Service: Ophthalmology   ??? PR UPPER GI ENDOSCOPY,DIAGNOSIS N/A 05/02/2013    Procedure: UGI ENDO, INCLUDE ESOPHAGUS, STOMACH, & DUODENUM &/OR JEJUNUM; DX W/WO COLLECTION SPECIMN, BY BRUSH OR WASH;  Surgeon: Clint Bolder, MD;  [facility];  Service: Gastroenterology   ??? S/P face lift     ??? S/P Lasik procedure     ??? TONSILLECTOMY       Social History:  Patient lives with a spouse and dogs in [place]. No tobacco, EtOH or illicit drug use    Family History:  Family History   Problem Relation Age of [place]   ??? Alzheimer's disease Mother    ??? Colon cancer Maternal Grandmother    ??? Cancer Maternal Grandmother         colon     Review of Systems:  A 12 point review of systems was negative except for pertinent items noted in the HPI.        Objective:   Vitals Signs: There were no vitals taken for this visit.    Wt Readings from Last 3 Encounters:   01/12/18 56.9 kg (125 lb 6.4 oz)   04/06/17 54.4 kg (120 lb)   03/03/17 50.8 kg (112 lb)       Ht Readings from Last 3 Encounters:   04/06/17 165.1 cm (5' 5)   03/03/17 162.6 cm (5' 4)   01/05/17 163 cm (5' 4.17)     General appearance:  Alert, cooperative, no distress  Head:  Normocephalic, atraumatic.    Eyes: PERRL, EOM's intact, conjunctiva/corneas clear, dry eyes   Nose: Nares normal, septum midline, muscosa normal and no drainage or sinus tenderness  Throat: oropharynx pink and moist without erythema or exudate  Neck: Supple and trachea midline  Lungs: Good breath sounds bilaterally. Respirations unlabored.  No wheezes or rhonchi.  Lungs clear to auscultation.   Chest:  No chest wall tenderness.  Heart: Regular rate and rhythm, S1 and S2 Normal. No murmur, rub or gallop  Adbomen: Soft, non-tender and non-distended with normoactive bowel sounds  Musculoskeletal:  No cyanosis, clubbing or edema  Pulses: 2+ and symmetric in all extremities  Skin: No rashes  Lymph Nodes: no palpable lymphadenopathy  Neurological:  No focal neurological deficits.  Milder degree of inattention on exam compared to previous visit    Pulmonary Function  Testing Results:    Marland Kitchen    Date FVC (%) FEV1 (%) Ratio FEF 25:75 (%) Comments   02/02/18        03/03/17 2.52 (86%) 1.87 (84%) 74 1.44 (79%)    07/15/16 2.45 (83%) 1.84 (83%) 75 1.38 (76%)    03/27/15 2.5L (85%) 2.09L (90%) 80 2.2L (114%)      I reviewed interval medical records.    Health Care Maintenance:  CF Annual Labs  LABS   CBC  Lab Results   Component Value Date    WBC 7.4 07/22/2016    HGB 13.6 07/22/2016    HCT 42.2 07/22/2016    PLT 273 07/22/2016       ELECTROLYTES  Lab Results   Component Value Date    Sodium 138 07/22/2016    Sodium 143 06/12/2014     Lab Results   Component Value Date    Potassium 4.9 07/22/2016    Potassium 5.2 (H) 06/12/2014     Lab Results   Component Value Date    Chloride 103 07/22/2016    Chloride 107 06/12/2014     Lab Results   Component Value Date    CO2 23.0 07/22/2016    CO2 24 06/12/2014     Lab Results   Component Value Date    BUN/H 12 01/07/2012    BUN 26 (H) 07/22/2016    BUN 20 06/12/2014     Lab Results   Component Value Date    Creatinine 0.97 07/22/2016    Creatinine 0.80 06/12/2014     Lab Results   Component Value Date    Glucose 95 07/22/2016    Glucose 91 06/12/2014     Lab Results   Component Value Date    Calcium 10.1 07/22/2016    Calcium 10.0 06/12/2014 Lab Results   Component Value Date    Anion Gap 12 07/22/2016    Anion Gap 12 06/12/2014       GI  Lab Results   Component Value Date    AST 26 07/22/2016    AST 38 06/12/2014     Lab Results   Component Value Date    ALT 16 07/22/2016    ALT 32 06/12/2014     Lab Results   Component Value Date    Alkaline Phosphatase 117 07/22/2016    Alkaline Phosphatase 89 06/12/2014     Lab Results   Component Value Date    Total Protein 7.2 07/22/2016    Total Protein 7.1 06/12/2014     Lab Results   Component Value Date    Albumin 4.5 07/22/2016    Albumin 4.7 06/12/2014     No results found for: GGT  Lab Results   Component Value Date    Total Bilirubin 0.5 07/22/2016    Total Bilirubin 1.0 06/12/2014       VITAMIN LEVELS / INR:    Lab Results   Component Value Date    Vitamin A 66.3 07/22/2016     Lab Results   Component Value Date    VIT D2 (25OH) <5 03/28/2015    VIT D2 (25OH) <5 06/12/2014     Lab Results   Component Value Date    VIT D3 (25OH) 32 03/28/2015    VIT D3 (25OH) 29 06/12/2014     Lab Results   Component Value Date    Vitamin D Total (25OH) 32 03/28/2015    Vitamin D Total (25OH) 29 06/12/2014  Lab Results   Component Value Date    Vitamin E 22.0 (H) 07/22/2016       Lab Results   Component Value Date    PT 11.1 07/22/2016    PT 10.7 03/01/2011     Lab Results   Component Value Date    INR 0.95 07/22/2016    INR 0.9 06/12/2014       IGE  Lab Results   Component Value Date    IgE, Total <2.00 (L) 07/22/2016    IgE, Total <2.00 (L) 03/28/2015    IgE, Total <2.00 06/12/2014    IgE, Total <2.00 04/16/2013    IgE, Total <2.0 09/01/2012       CULTURES  Lab Results   Component Value Date    CF Sputum Culture 4+ Oropharyngeal Flora Isolated 03/03/2017    CF Sputum Culture 3+ Mucoid Pseudomonas aeruginosa (A) 03/03/2017    CF Sputum Culture 3+ Smooth Pseudomonas aeruginosa (A) 03/03/2017    CF Sputum Culture 3+ OROPHARYNGEAL FLORA ISOLATED  :   (A) 06/06/2014    CF Sputum Culture 1+ 06/06/2014    CF Sputum Culture 1+ 06/06/2014     Lab Results   Component Value Date    Organism Mucoid Pseudomonas aeruginosa (A) 06/06/2014    Organism Smooth Pseudomonas aeruginosa (A) 06/06/2014     CT 04/18/15  FINDINGS:   No lymphadenopathy in the chest. Calcified mediastinal nodes.   Heart size normal. ??No pericardial effusion. ??Scattered atherosclerotic calcifications in the aorta and coronary arteries.  Unchanged severe volume loss and scarring of the entire right upper lobe. ??Mild diffuse bronchiectasis, similar to prior with mucous impacted airways. ??Scattered micronodular opacities in the right lower lobe right middle lobe, and lingula. ??No focal consolidation.  No pleural effusion or pneumothorax.   Partially imaged upper abdomen is normal.   No acute or worrisome osseous lesions.   IMPRESSION: Findings compatible with history of cystic fibrosis including bronchiectasis, peribronchial thickening, and impacted airways. No focal consolidations.    Screening colonoscopy -   04/06/17 - several sessile polyps, diverticula, internal hemorrhoids    Mammogram 12/08/16 ([facility])  IMPRESSION:??   No mammographic evidence of malignancy. A result letter of this??   screening mammogram will be mailed directly to the patient.??   RECOMMENDATION:??   Screening mammogram in one year. (Code:[id number]-B-01Y)??   BI-RADS CATEGORY????1: Negative.

## 2018-02-23 NOTE — Unmapped (Signed)
Alyssa Willis had an appt scheduled for today, 02/23/18, at 1pm. She cancelled this appt just this AM. This is the second time in the last month that she has cancelled an appt on the same day.

## 2018-02-28 NOTE — Unmapped (Signed)
[facility] Refill and Clinical Coordination Note  Medication(s): [name] 150mg  (OH: 1.5 weeks)  (refill requested, signature required on delivery)     Eulas Post, DOB: [birth year]  Phone: [phone] (home) , Alternate phone contact: N/A  Shipping address: [address]  [place] [address]  Phone or address changes today?: No  All above HIPAA information verified.  Insurance changes? No    Completed refill and clinical call assessment today to schedule patient's medication shipment from the [facility] [phone]).      MEDICATION RECONCILIATION    Confirmed the medication and dosage are correct and have not changed: Updated medlist with all meds that she does not use anymore    Were there any changes to your medication(s) in the past month:  No, there are no changes reported at this time.    ADHERENCE    Is this medicine transplant or covered by Medicare Part B? No.    Not Applicable    Did you miss any doses in the past 4 weeks? No missed doses reported.  Adherence counseling provided? Not needed     SIDE EFFECT MANAGEMENT    Are you tolerating your medication?:  [name] reports tolerating the medication.  Side effect management discussed: None      Therapy is appropriate and should be continued.    Evidence of clinical benefit: Do you feel that that the medication is helping? Yes      FINANCIAL/SHIPPING    Delivery Scheduled: Yes, Expected medication delivery date: 03/02/18.  However, Rx request for refills was sent to the provider as there are none remaining.     Medication will be delivered via UPS to the home address in [facility].    Additional medications refilled: No additional medications/refills needed at this time.    The patient will receive a drug information handout for each medication shipped and additional FDA Medication Guides as required.      [name] did not have any additional questions at this time.    Delivery address confirmed in Epic.     We will follow up with patient monthly for standard refill processing and delivery.      Thank you,  Julianne Rice   [facility] Specialty Pharmacist

## 2018-03-01 MED ORDER — IVACAFTOR 150 MG TABLET
ORAL_TABLET | 1 refills | 0 days | Status: CP
Start: 2018-03-01 — End: 2018-04-24
  Filled 2018-03-02: qty 56, 28d supply, fill #0

## 2018-03-01 MED ORDER — PEG-ELECTROLYTE SOLUTION 420 GRAM ORAL SOLUTION
0 refills | 0 days | Status: CP
Start: 2018-03-01 — End: 2018-03-09

## 2018-03-01 NOTE — Unmapped (Signed)
Alyssa Willis 's [facility] shipment will be delayed due to Refill too soon until 03/02/18 We have contacted the patient and communicated the delivery change to patient/caregiver We will reschedule the medication for the delivery date that the patient agreed upon. We have confirmed the delivery date as 03/03/18 .

## 2018-03-02 MED FILL — KALYDECO 150 MG TABLET: 28 days supply | Qty: 56 | Fill #0 | Status: AC

## 2018-03-06 NOTE — Unmapped (Signed)
Cystic Fibrosis Clinic Visit:    Assessment:   Alyssa Willis is a 72 y.o. female with cystic fibrosis who presents today for a well visit; she is eligible for Symdeko based on her mutations, but is doing so well on [name] that I see no reason to change at this point.  She is in agreement with this point.  She is not eligible for Trikafta as she does not have a delta F508 mutation.  In general she remains very stable.  Her lung function is excellent for age.      Problem List Items Addressed This Visit        Respiratory    Cystic fibrosis (CMS-HCC) - Primary    Relevant Medications    sodium chloride 10 % Nebu    Other Relevant Orders    Comprehensive Metabolic Panel    IgE Total    PT-INR    Vitamin A    Vitamin D 25 Hydroxy (25OH D2 + D3)    Vitamin E    Hemoglobin A1c    CBC w/ Differential       Musculoskeletal and Integument    Left humeral fracture    Relevant Orders    Vitamin D 25 Hydroxy (25OH D2 + D3)      Other Visit Diagnoses     Abnormal coagulation profile         Relevant Orders    PT-INR    Other specified disorders of pancreatic internal secretion         Relevant Orders    Hemoglobin A1c    Anorexia        Relevant Medications    mirtazapine (REMERON) 15 MG tablet        Plan:   1) needs annual lab check -requested today  2) renewal of [name] and hypertonic saline sent to her pharmacy.  3) annual visits with Social Work, Scientific laboratory technician, Pharmacy and Respiratory therapy  4) discontinuation of Periactin due to side effects.  Will trial mirtazapine at a low dose at night.  Start at 7.5 mg nightly.    Given her clinical stability over the last several years, with normal lung function testing and no need for antibiotics, I have let Alyssa Willis  know that we can follow her annually at this point.  She should of course reach out to [place] if she experiences any issues that require her coming back sooner.     Subjective:   Alyssa Willis is a 72 y.o. 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  Her last visit with me was in December 2018.  Since starting [name] ~5 years ago, her health has been very stable; she has not had any cough and only very rarely has clear sputum production.  She reports that she can go for days without coughing.  She denies any problems with constipation or oily stool.      In the interim since her last visit, she had her repeat colonoscopy in January with an additional 3 polyps removed. Path was unremarkable. Otherwise she reports she fractured her left humerus (fell on the treadmill when her shoelace became untied) - occurred in March and had surgery in June.     She is now being seen by a pain management specialist now due to back pain. Deciding about injections.  Feels rather upset with her primary care physician who she report labeled her as being opioid addicted.  She admits to sharing her oxycodone prescription with her husband after he developed back pain in the setting of doing some aggressive yard work.    Her only current complaint is a decline in appetite, which is been a report for many years.  She does feel some efficacy with Periactin but also feels that this makes her groggy.  She is open to trialing alternatives.  CF Overview:     Genetics: R117H/5T and 3272-26 A-->G, on [name] since Feb 2015    Airway Clearance Regimen: VEST, HS on occasion (once a day)    Exercise Habits: Exercise capacity is rated as normal and is felt to be unchanged compared to last visit. Was exercising in a gym but has not been back there since breaking her arm.     Inhaled ABX: no longer using [place] -- removed from her EMR    The most recent course of IV antibiotics was in October 2013.    The most recent course of oral antibiotics was in June 2015.    Typical bacteria cultured include smooth and mucoid pseudomonas; she has also isolated mycobacterium avium complex repeatedly but does not have symptoms of NTM infection.  No longer produces sputum for culture    The patient judges that exacerbations requiring any antibiotic intervention (oral, inhaled, IV) are occurring 0 times per year.    Hemoptysis: no  ABPA:  no  Ptx:  no  Sinus symptoms (congestion, rhinorrhea, pain) are absent and this is felt to be the same compared to last visit.     Panc Insuf: no  PEG:  no  DIOS:  no  CF Liver Dz:   no  Patient is having approximately 1 stool per day.  Stools are described as normal in appearance.      Diabetes: No  Oral glucose tolerance test - 07/22/16  Fasting: 95  2 hour: 89    Osteopenia: Yes, based on DXA 01/09/16; states she has not been on her alendronate for many months now, but doesn't know why  DXA findings:  FINDINGS: The bone mineral density in the spine measuring L1 to 4 measures 0.889 gm/cm2. ??The ??Z score is 0.8 and the T score is -1.4. ??This value is above the fracture risk threshold. ??This represents an insignificant increase of 1.5% when compared with a previous measurement of 0.876 gm/cm2.    The total bone mineral density in the proximal left femur measures 0.603 gm/cm2. ??The Z score is -1.2 and the T score is -2.8. ??This represents an insignificant decrease of 1.8% when compared with the previous measurement of 0.614 gm/cm2. ??The femoral neck density is 0.458 gm/cm2, and the femoral neck T score is -3.5. ??The other T scores range from -2.7 to -2.2. ??These values are below the fracture risk thresholds.    ??   Impression     The femoral density indicates osteoporosis, but the spine density indicates osteopenia. ??The measurements have not changed significantly since prior examination.     Depression: Yes; had a good relationship with our prior SW; has not engaged with our new social workers but met with Alyssa Willis today.  Adherence to medications and airway clearance is rated as Poor - but does not really need to do any airway clearance    Medications:  Current Outpatient Medications on File Prior to Visit   Medication Sig Dispense Refill   ??? albuterol (PROVENTIL HFA;VENTOLIN HFA) 90 mcg/actuation inhaler Inhale 2 puffs every six (6) hours as needed for wheezing (and  before hypertonic saline). Please dispense only Proventil HFA. 3 Inhaler 4   ??? aspirin (ECOTRIN) 81 MG tablet Take 81 mg by mouth daily.     ??? CALCIUM CARBONATE (CALCIUM 500 ORAL) Take 1,000 mg by mouth daily.     ??? cholecalciferol, vitamin D3, 5,000 unit Tab Take 5,000 Int'l Units by mouth daily.     ??? enalapril (VASOTEC) 5 MG tablet Take 2.5 mg by mouth daily.      ??? ivacaftor 150 mg Tab TAKE 1 TABLET BY MOUTH WITH FATTY FOOD EVERY TWELVE HOURS 56 tablet 1   ??? metoprolol succinate (TOPROL-XL) 25 MG 24 hr tablet Take 25 mg by mouth Two (2) times a day.      ??? multivit-min-FA-lycopen-lutein (CENTRUM SILVER) 0.4-300-250 mg-mcg-mcg Tab Take 1 tablet by mouth daily at 0600.      ??? nebulizer accessories (REUSABLE NEBULIZER KIT) Kit Please dispense one Pari LC Plus reusable nebulizer cup to be used with inhaled medications (hypertonic saline). Dx Code: J47.9, E84.0 1 kit 2   ??? PARI LC D NEBULIZER Misc Provide 1 device  for use with inhaled medication. 3 each 3   ??? risedronate (ACTONEL) 150 MG tablet Take 150 mg by mouth every thirty (30) days. with water on empty stomach, nothing by mouth or lie down for next 30 minutes.     ??? simvastatin (ZOCOR) 80 MG tablet Take 80 mg by mouth nightly.     ??? temAZEpam (RESTORIL) 15 mg capsule Take 15 mg by mouth.     ??? tretinoin (ALTRALIN) 0.05 % gel Apply 1 application topically nightly. As needed       No current facility-administered medications on file prior to visit.      The patient does not use Pulmozyme due to hemoptysis.  She will also not use TOBI in the future due to wheezing that occurs when she inhales this drug.    Allergies:  Allergies   Allergen Reactions   ??? Tobramycin Shortness Of Breath     wheezing   ??? Tobi [Tobramycin In 0.225 % Nacl] Other (See Comments)     wheezing  wheezing   ??? Codeine Itching and Nausea Only   ??? Galantamine Nausea Only   ??? Hydrocodone Itching       Past Medical History:  Past Medical History:   Diagnosis Date   ??? Cervical disc disease    ??? Chronic kidney disease (CKD), stage I    ??? Chronic sinusitis    ??? Cystic fibrosis (CMS-HCC)    ??? Dry eyes    ??? 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 GERD (gastroesophageal reflux disease)    ??? Headache(784.0)    ??? History of facelift    ??? Hyperlipidemia    ??? Hypertension    ??? Mycobacterium avium complex (CMS-HCC)    ??? Osteoporosis      Past Surgical History:  Past Surgical History:   Procedure Laterality Date   ??? ADENOIDECTOMY     ??? BRONCHOSCOPY     ??? LACRIMAL DUCT PROBING W/ STENT PLACEMENT  1970   ??? LASIK Bilateral 03/2002    Dr. Gershon Crane - preoperative RX was -4.75 -0.75 x 170 OD and -4.75 -0.75 x 180 OS   ??? PR COLONOSCOPY FLX DX W/COLLJ SPEC WHEN PFRMD N/A 05/02/2013    Procedure: COLONOSCOPY, FLEXIBLE, PROXIMAL TO SPLENIC FLEXURE; DIAGNOSTIC, W/WO COLLECTION SPECIMEN BY BRUSH OR WASH;  Surgeon: Clint Bolder, MD;  [facility];  Service: Gastroenterology   ??? PR COLONOSCOPY W/BIOPSY SINGLE/MULTIPLE N/A 04/06/2017    Procedure: COLONOSCOPY, FLEXIBLE, PROXIMAL TO SPLENIC FLEXURE; WITH BIOPSY, SINGLE OR MULTIPLE;  Surgeon: Jolyn Lent, MD;  [facility];  Service: Gastroenterology   ??? PR COLSC FLX W/REMOVAL LESION BY HOT BX FORCEPS N/A 01/05/2017    Procedure: COLONOSCOPY, FLEXIBLE, PROXIMAL TO SPLENIC FLEXURE; W/REMOVAL TUMOR/POLYP/OTHER LESION, HOT BX FORCEP/CAUTE;  Surgeon: Jolyn Lent, MD;  Location: [facility];  Service: Gastroenterology   ??? PR COLSC FLX W/RMVL OF TUMOR POLYP LESION SNARE TQ N/A 11/11/2015    Procedure: COLONOSCOPY FLEX; W/REMOV TUMOR/LES BY SNARE;  Surgeon: Jolyn Lent, MD;  [facility]: [facility];  Service: Gastroenterology   ??? PR COLSC FLX W/RMVL OF TUMOR POLYP LESION SNARE TQ N/A 01/05/2017    Procedure: COLONOSCOPY FLEX; W/REMOV TUMOR/LES BY SNARE;  Surgeon: Jolyn Lent, MD;  [facility];  Service: Gastroenterology   ??? PR COLSC FLX WITH DIRECTED SUBMUCOSAL NJX ANY SBST N/A 11/11/2015    Procedure: COLONOSCOPY, FLEXIBLE, PROXIMAL TO SPLENIC FLEXURE; WITH DIRECTED SUBMUCOSAL INJECTION(S), ANY SUBSTANCE;  Surgeon: Jolyn Lent, MD;  Location: [facility];  Service: Gastroenterology   ??? PR REMV CATARACT EXTRACAP,INSERT LENS Right 12/16/2015    Procedure: EXTRACAPSULAR CATARACT REMOVAL W/INSERTION OF INTRAOCULAR LENS PROSTHESIS, MANUAL OR MECHANICAL TECHNIQUE;  Surgeon: Earlie Counts, MD;  [facility];  Service: Ophthalmology   ??? PR REMV CATARACT EXTRACAP,INSERT LENS Left 05/25/2016    Procedure: EXTRACAPSULAR CATARACT REMOVAL W/INSERTION OF INTRAOCULAR LENS PROSTHESIS, MANUAL OR MECHANICAL TECHNIQUE;  Surgeon: Earlie Counts, MD;  [facility];  Service: Ophthalmology   ??? PR UPPER GI ENDOSCOPY,DIAGNOSIS N/A 05/02/2013    Procedure: UGI ENDO, INCLUDE ESOPHAGUS, STOMACH, & DUODENUM &/OR JEJUNUM; DX W/WO COLLECTION SPECIMN, BY BRUSH OR WASH;  Surgeon: Clint Bolder, MD;  [facility];  Service: Gastroenterology   ??? S/P face lift     ??? S/P Lasik procedure     ??? TONSILLECTOMY       Social History:  Patient lives with a spouse and dogs in [place]. No tobacco, EtOH or illicit drug use    Family History:  Family History   Problem Relation Age of Onset   ??? Alzheimer's disease Mother    ??? Colon cancer Maternal Grandmother    ??? Cancer Maternal Grandmother         colon     Review of Systems:  A 12 point review of systems was negative except for pertinent items noted in the HPI.        Objective:   Vitals Signs: BP 118/57 (BP Site: L Arm, BP Position: Sitting, BP Cuff Size: Medium)  - Pulse 65  - Temp 36.4 ??C (97.5 ??F) (Oral)  - Ht 163 cm (5' 4.17) Comment: w/o shoes - Wt 54 kg (119 lb)  - SpO2 99% Comment: resting on room air - BMI 20.32 kg/m??     Wt Readings from Last 3 Encounters:   03/09/18 54 kg (119 lb)   01/12/18 56.9 kg (125 lb 6.4 oz)   04/06/17 54.4 kg (120 lb)       Ht Readings from Last 3 Encounters:   03/09/18 163 cm (5' 4.17)   04/06/17 165.1 cm (5' 5)   03/03/17 162.6 cm (5' 4)     General appearance:  Alert, cooperative, no distress  Head:  Normocephalic, atraumatic.    Eyes: PERRL, EOM's intact, conjunctiva/corneas clear, dry eyes   Nose: Nares normal, septum midline, muscosa normal and no drainage or sinus tenderness  Throat: oropharynx pink and moist without erythema or exudate  Neck: Supple and trachea midline  Lungs: Good breath sounds bilaterally. Respirations unlabored.  No wheezes or rhonchi.  Lungs clear to auscultation.   Chest:  No chest wall tenderness.  Heart: Regular rate and rhythm, S1 and S2 Normal. No murmur, rub or gallop  Adbomen: Soft, non-tender and non-distended with normoactive bowel sounds  Musculoskeletal:  No cyanosis, clubbing or edema  Pulses: 2+ and symmetric in all extremities  Skin: No rashes  Lymph Nodes: no palpable lymphadenopathy  Neurological:  No focal neurological deficits.  Milder degree of inattention on exam compared to previous visit    Pulmonary Function Testing Results:        Date FVC (%) FEV1 (%) Ratio FEF 25:75 (%) Comments   03/09/18 2.54 (87%) 1.9 (87%) 75 1.41 (80%)    03/03/17 2.52 (86%) 1.87 (84%) 74 1.44 (79%)    07/15/16 2.45 (83%) 1.84 (83%) 75 1.38 (76%)    03/27/15 2.5L (85%) 2.09L (90%) 80 2.2L (114%)      I reviewed interval medical records.    Health Care Maintenance:  CF Annual Labs  LABS   CBC  Lab Results   Component Value Date    WBC 7.4 07/22/2016    HGB 13.6 07/22/2016    HCT 42.2 07/22/2016    PLT 273 07/22/2016       ELECTROLYTES  Lab Results   Component Value Date    Sodium 138 07/22/2016    Sodium 143 06/12/2014     Lab Results   Component Value Date    Potassium 4.9 07/22/2016    Potassium 5.2 (H) 06/12/2014     Lab Results Component Value Date    Chloride 103 07/22/2016    Chloride 107 06/12/2014     Lab Results   Component Value Date    CO2 23.0 07/22/2016    CO2 24 06/12/2014     Lab Results   Component Value Date    BUN/H 12 01/07/2012    BUN 26 (H) 07/22/2016    BUN 20 06/12/2014     Lab Results   Component Value Date    Creatinine 0.97 07/22/2016    Creatinine 0.80 06/12/2014     Lab Results   Component Value Date    Glucose 95 07/22/2016    Glucose 91 06/12/2014     Lab Results   Component Value Date    Calcium 10.1 07/22/2016    Calcium 10.0 06/12/2014     Lab Results   Component Value Date    Anion Gap 12 07/22/2016    Anion Gap 12 06/12/2014       GI  Lab Results   Component Value Date    AST 26 07/22/2016    AST 38 06/12/2014     Lab Results   Component Value Date    ALT 16 07/22/2016    ALT 32 06/12/2014     Lab Results   Component Value Date    Alkaline Phosphatase 117 07/22/2016    Alkaline Phosphatase 89 06/12/2014     Lab Results   Component Value Date    Total Protein 7.2 07/22/2016    Total Protein 7.1 06/12/2014     Lab Results   Component Value Date    Albumin 4.5 07/22/2016  Albumin 4.7 06/12/2014     No results found for: GGT  Lab Results   Component Value Date    Total Bilirubin 0.5 07/22/2016    Total Bilirubin 1.0 06/12/2014       VITAMIN LEVELS / INR:    Lab Results   Component Value Date    Vitamin A 66.3 07/22/2016     Lab Results   Component Value Date    VIT D2 (25OH) <5 03/28/2015    VIT D2 (25OH) <5 06/12/2014     Lab Results   Component Value Date    VIT D3 (25OH) 32 03/28/2015    VIT D3 (25OH) 29 06/12/2014     Lab Results   Component Value Date    Vitamin D Total (25OH) 32 03/28/2015    Vitamin D Total (25OH) 29 06/12/2014     Lab Results   Component Value Date    Vitamin E 22.0 (H) 07/22/2016       Lab Results   Component Value Date    PT 11.1 07/22/2016    PT 10.7 03/01/2011     Lab Results   Component Value Date    INR 0.95 07/22/2016    INR 0.9 06/12/2014       IGE  Lab Results   Component Value Date    IgE, Total <2.00 (L) 07/22/2016    IgE, Total <2.00 (L) 03/28/2015    IgE, Total <2.00 06/12/2014    IgE, Total <2.00 04/16/2013    IgE, Total <2.0 09/01/2012       CULTURES  Lab Results   Component Value Date    CF Sputum Culture 4+ Oropharyngeal Flora Isolated 03/03/2017    CF Sputum Culture 3+ Mucoid Pseudomonas aeruginosa (A) 03/03/2017    CF Sputum Culture 3+ Smooth Pseudomonas aeruginosa (A) 03/03/2017    CF Sputum Culture 3+ OROPHARYNGEAL FLORA ISOLATED  :   (A) 06/06/2014    CF Sputum Culture 1+ 06/06/2014    CF Sputum Culture 1+ 06/06/2014     Lab Results   Component Value Date    Organism Mucoid Pseudomonas aeruginosa (A) 06/06/2014    Organism Smooth Pseudomonas aeruginosa (A) 06/06/2014     CT 04/18/15  FINDINGS:   No lymphadenopathy in the chest. Calcified mediastinal nodes.   Heart size normal. ??No pericardial effusion. ??Scattered atherosclerotic calcifications in the aorta and coronary arteries.  Unchanged severe volume loss and scarring of the entire right upper lobe. ??Mild diffuse bronchiectasis, similar to prior with mucous impacted airways. ??Scattered micronodular opacities in the right lower lobe right middle lobe, and lingula. ??No focal consolidation.  No pleural effusion or pneumothorax.   Partially imaged upper abdomen is normal.   No acute or worrisome osseous lesions.   IMPRESSION: Findings compatible with history of cystic fibrosis including bronchiectasis, peribronchial thickening, and impacted airways. No focal consolidations.    Screening colonoscopy -   04/06/17 - several sessile polyps, diverticula, internal hemorrhoids    Mammogram 12/08/16 ([facility])  IMPRESSION:??   No mammographic evidence of malignancy. A result letter of this??   screening mammogram will be mailed directly to the patient.??   RECOMMENDATION:??   Screening mammogram in one year. (Code:SM-B-01Y)??   BI-RADS CATEGORY????1: Negative.

## 2018-03-07 NOTE — Progress Notes (Signed)
Patient's Name: Brenda Clarke  MRN: [id number]  Referring Provider: Rusty Aus, MD  DOB: 02-19-1946  PCP: Rusty Aus, MD  DOS: 03/08/2018  Note by: Gaspar Cola, MD  Service setting: Ambulatory outpatient  Specialty: Interventional Pain Management  Location: [facility] (AMB) Pain Management Facility    Patient type: Established   Primary Reason(s) for Visit: Evaluation of chronic illnesses with exacerbation, or progression (Level of risk: moderate) CC: Back Pain  HPI  Brenda Clarke is a 72 y.o. year old, female patient, who comes today for a follow-up evaluation. 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[place]); Cystic fibrosis ([place]); Essential hypertension; Fibrocystic breast disease; Gastro-esophageal reflux disease with esophagitis; Heartburn; Hyperlipidemia; Laceration of finger; Low serum vitamin D; Migraine without status migrainosus, not intractable; Monocular diplopia; DDD (degenerative disc disease), cervical; Chronic neck pain; Pain in finger; Prolapse of anterior vaginal wall; Recurrent major depressive disorder, in partial remission ([place]); Chronic low back pain [facility] Area of Pain) (Right); Chronic tendinitis of rotator cuff (Left); S/P cataract extraction and insertion of intraocular lens, left; Screen for colon cancer; Spondylosis of cervical region without myelopathy or radiculopathy; Tubular adenoma; Uterovaginal prolapse; Chronic shoulder pain (Primary Area of Pain) (Left); Chronic upper extremity pain (Secondary Area of Pain) (Left); Chronic pain syndrome; Long term current use of opiate analgesic; Pharmacologic therapy; Disorder of skeletal system; Problems influencing health status; Cervicalgia; Traumatic tear of rotator cuff, sequela (Left); Supraspinatus tendonitis (Left);  Infraspinatus tendonitis (Left); Osteoarthritis of AC (acromioclavicular) joint (Left); Subacromial bursitis (Left); Subdeltoid bursitis (Left); Effusion of glenohumeral joint (Left); History of traumatic closed nondisplaced fracture of greater tuberosity of humerus (Left); Chronic upper back pain; Pain of rhomboid muscle; Abnormal x-ray of cervical spine; Cervical Grade 1 Anterolisthesis of C4/C5 & C5/C6.; Cervical facet arthropathy (Bilateral); Cervical facet syndrome (Bilateral); 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 Brenda Clarke was last seen on 12/14/2017. Her primarily concern today is the Back Pain  Pain Assessment: Location: Upper Back(and neck) Radiating: Denies Onset: More than a month ago Duration: Chronic pain Quality: Sharp Severity: 7 /10 (subjective, self-reported pain score)  Note: Reported level is inconsistent with clinical observations. Clinically the patient looks like a 4/10 A 4/10 is viewed as "Moderately Severe" and described as impossible to ignore for more than a few minutes. With effort, patients may still be able to manage work or participate in some social activities. Very difficult to concentrate. Signs of autonomic nervous system discharge are evident: dilated pupils (mydriasis); mild sweating (diaphoresis); sleep interference. Heart rate becomes elevated (>115 bpm). Diastolic blood pressure (lower number) rises above 100 mmHg. Patients find relief in laying down and not moving. Information on the proper use of the pain scale provided to the patient today. When using our objective Pain Scale, levels between 6 and 10/10 are said to belong in an emergency room, as it progressively worsens from a 6/10, described as severely limiting, requiring emergency care not usually available at an outpatient pain management facility. At a 6/10 level, communication becomes difficult and requires great effort. Assistance to  reach the emergency department may be required. Facial flushing and profuse sweating along with potentially dangerous increases in heart rate and blood pressure will be evident. Effect on ADL: limits my daily activities Timing: Constant Modifying factors: hot water BP: 113/68  HR: 70  The  patient was last seen on 12/14/2017. According to the patient her primary area pain was her left shoulder. This was related to a fall that she had a January 2019. At the time, she suffered a fracture to her shoulder and humerus. She underwent surgery in March 2019 by Dr. Roland Rack, and since, she has improved significantly. She did do physical therapy, which was effective. She now returns indicating that her primary pain today is between the shoulder blades and she points at the midline, just below the C7 & T1 spinous processes. She is tender to palpation over the supraspinous ligament.  This appears to be worse than in the area of the paravertebral muscles.  Below that she has more pain covering the area of the rhomboid muscles, bilaterally.  Her second area of pain was her left arm. She admits the pain from her shoulder radiates down into her elbow. This seems to have improved as well after the surgery.  She denies any numbness or tingling. She feels weak. The pain was primarily associated to movement. She did not have any surgery on her humerus.   She admits having lower back pain and neck pain.   Further details on both, my assessment(s), as well as the proposed treatment plan, please see below.  Laboratory Chemistry  Inflammation Markers (CRP: Acute Phase) (ESR: Chronic Phase) Lab Results  Component Value Date   CRP 3 12/01/2017   ESRSEDRATE 5 12/01/2017                         Rheumatology Markers No results found.  Renal Function Markers Lab Results  Component Value Date   BUN 20 11/17/2017   CREATININE 0.86 11/17/2017   GFRAA >60 11/17/2017   GFRNONAA >60 11/17/2017                              Hepatic Function Markers Lab Results  Component Value Date   AST 27 11/17/2017   ALT 16 11/17/2017   ALBUMIN 4.2 11/17/2017   ALKPHOS 89 11/17/2017   LIPASE 19 11/17/2017                        Electrolytes Lab Results  Component Value Date   NA 136 11/17/2017   K 4.7 11/17/2017   CL 105 11/17/2017   CALCIUM 9.4 11/17/2017   MG 2.0 12/01/2017                        Neuropathy Markers No results found.  CNS Tests No results found.  Bone Pathology Markers No results found.  Coagulation Parameters Lab Results  Component Value Date   INR 1.00 10/15/2015   LABPROT 13.2 10/15/2015   APTT 26 10/15/2015   PLT 302 11/17/2017                        Cardiovascular Markers Lab Results  Component Value Date   TROPONINI <0.03 11/14/2017   HGB 13.5 11/17/2017   HCT 39.3 11/17/2017                         CA Markers No results found.  Note: Lab results reviewed.  Imaging Review  Shoulder Imaging: Shoulder-L MR wo contrast:  Results for orders placed during the hospital encounter of 09/06/17  MR SHOULDER LEFT WO CONTRAST  Narrative CLINICAL DATA:  Nondisplaced fracture of the greater tuberosity left humerus. Persistent pain.  EXAM: MRI OF THE LEFT SHOULDER WITHOUT CONTRAST  TECHNIQUE: Multiplanar, multisequence MR imaging of the shoulder was performed. No intravenous contrast was administered.  COMPARISON:  05/24/2017  FINDINGS: Rotator cuff: Severe tendinosis of the supraspinatus tendon with a partial-thickness articular-surface tear. Moderate tendinosis of the infraspinatus tendon with a focal area of severe tendinosis. Teres minor tendon is intact. Subscapularis tendon is intact.  Muscles: No atrophy or fatty replacement of nor abnormal signal within, the muscles of the rotator cuff.  Biceps long head:  Intact.  Acromioclavicular Joint: Mild arthropathy of the acromioclavicular joint. Type I acromion. Small amount of  subacromial/subdeltoid bursal fluid.  Glenohumeral Joint: Small joint effusion.  No chondral defect.  Labrum: Grossly intact, but evaluation is limited by lack of intraarticular fluid.  Bones: Interval healing of the left greater tuberosity fracture with residual marrow edema which in part may be resulting from reactive subcortical marrow changes from severe tendinosis of the supraspinatus tendon. No aggressive osseous lesion.  Other: No fluid collection or hematoma.  IMPRESSION: 1. Severe tendinosis of the supraspinatus tendon with a partial-thickness articular-surface tear. 2. Moderate tendinosis of the infraspinatus tendon with a focal area of severe tendinosis. 3. Interval healing of the left greater tuberosity fracture with residual marrow edema which in part may be resulting from reactive subcortical marrow changes from severe tendinosis of the supraspinatus tendon.   Electronically Signed   By: Kathreen Devoid   On: 09/06/2017 09:59    Complexity Note: Imaging results reviewed. Results shared with Ms. Pizzi, using Layman's terms.                        Meds   Current Outpatient Medications:  .  Calcium Citrate 333 MG TABS, Take 2 capsules by mouth daily. , Disp: , Rfl:  .  Cholecalciferol (VITAMIN D3) 5000 units CAPS, Take 5,000 Units by mouth daily., Disp: , Rfl:  .  Ivacaftor ([name]) 150 MG TABS, Take 150 mg by mouth 2 (two) times daily., Disp: , Rfl:  .  metoprolol succinate (TOPROL-XL) 25 MG 24 hr tablet, Take 1 tablet by mouth 2 (two) times daily., Disp: , Rfl:  .  Multiple Vitamins-Minerals (WOMENS MULTIVITAMIN PO), Take 1 tablet by mouth daily., Disp: , Rfl:  .  Omega-3 Fatty Acids (FISH OIL) 1200 MG CAPS, Take 1 capsule by mouth daily., Disp: , Rfl:  .  sertraline (ZOLOFT) 100 MG tablet, Take 100 mg by mouth at bedtime., Disp: , Rfl:  .  simvastatin (ZOCOR) 40 MG tablet, Take 40 mg by mouth at bedtime. , Disp: , Rfl:  .  Sodium Chloride 10 % NEBU, Inhale 1  Dose into the lungs as needed., Disp: , Rfl:  .  zolpidem (AMBIEN CR) 12.5 MG CR tablet, Take 12.5 mg by mouth at bedtime as needed for sleep. , Disp: , Rfl:  .  galantamine (RAZADYNE) 4 MG tablet, Take 4 mg by mouth every morning., Disp: , Rfl:  .  predniSONE (DELTASONE) 20 MG tablet, Take 3 tab(s) in the morning x 3 days, then 2 tab(s) x 3 days, followed by 1 tab x 3 days., Disp: 21 tablet, Rfl: 0 .  tiZANidine (ZANAFLEX) 4 MG tablet, Take 1 tablet (4 mg total) by mouth every 8 (eight) hours as needed for muscle spasms., Disp: 90 tablet, Rfl: 0  ROS  Constitutional: Denies any fever or chills Gastrointestinal: No reported  hemesis, hematochezia, vomiting, or acute GI distress Musculoskeletal: Denies any acute onset joint swelling, redness, loss of ROM, or weakness Neurological: No reported episodes of acute onset apraxia, aphasia, dysarthria, agnosia, amnesia, paralysis, loss of coordination, or loss of consciousness  Allergies  Ms. Chamberlin is allergic to tobramycin; codeine; galantamine; and hydrocodone.  PFSH  Drug: Ms. Tijerino  reports no history of drug use. Alcohol:  reports previous alcohol use. Tobacco:  reports that she has never smoked. She has never used smokeless tobacco. Medical:  has a past medical history of Cystic fibrosis ([place]), Depression, GERD (gastroesophageal reflux disease), History of hiatal hernia, Hypercholesteremia, and Hypertension. Surgical: Ms. Foulk  has a past surgical history that includes Colonoscopy; Bronchoscopy; Nasal sinus surgery; Tonsillectomy; Eye surgery; Cataract extraction w/ intraocular lens  implant, bilateral (Bilateral); and Shoulder arthroscopy with open rotator cuff repair (Left, 09/15/2017). Family: family history includes Alzheimer's disease in her mother; Stroke in her mother.  Constitutional Exam  General appearance: Well nourished, well developed, and well hydrated. In no apparent acute distress Vitals:   03/08/18 1050  BP: 113/68   Pulse: 70  Temp: 97.9 F (36.6 C)  SpO2: 100%  Weight: 117 lb (53.1 kg)  Height: _0  (1.651 m)   BMI Assessment: Estimated body mass index is 19.47 kg/m as calculated from the following:   Height as of this encounter: _1  (1.651 m).   Weight as of this encounter: 117 lb (53.1 kg).  BMI interpretation table: BMI level Category Range association with higher incidence of chronic pain  <18 kg/m2 Underweight   18.5-24.9 kg/m2 Ideal body weight   25-29.9 kg/m2 Overweight Increased incidence by 20%  30-34.9 kg/m2 Obese (Class I) Increased incidence by 68%  35-39.9 kg/m2 Severe obesity (Class II) Increased incidence by 136%  >40 kg/m2 Extreme obesity (Class III) Increased incidence by 254%   Patient's current BMI Ideal Body weight  Body mass index is 19.47 kg/m. Ideal body weight: 57 kg (125 lb 10.6 oz)   BMI Readings from Last 4 Encounters:  03/08/18 19.47 kg/m  12/14/17 20.80 kg/m  12/01/17 19.64 kg/m  11/17/17 19.97 kg/m   Wt Readings from Last 4 Encounters:  03/08/18 117 lb (53.1 kg)  12/14/17 125 lb (56.7 kg)  12/01/17 118 lb (53.5 kg)  11/17/17 120 lb (54.4 kg)  Psych/Mental status: Alert, oriented x 3 (person, place, & time)       Eyes: PERLA Respiratory: No evidence of acute respiratory distress  Cervical Spine Area Exam  Skin & Axial Inspection: No masses, redness, edema, swelling, or associated skin lesions Alignment: Symmetrical Functional ROM: Decreased ROM      Stability: No instability detected Muscle Tone/Strength: Functionally intact. No obvious neuro-muscular anomalies detected. Sensory (Neurological): Movement-associated discomfort Palpation: Tender              Upper Extremity (UE) Exam    Side: Right upper extremity  Side: Left upper extremity  Skin & Extremity Inspection: Skin color, temperature, and hair growth are WNL. No peripheral edema or cyanosis. No masses, redness, swelling, asymmetry, or associated skin lesions. No contractures.  Skin  & Extremity Inspection: Skin color, temperature, and hair growth are WNL. No peripheral edema or cyanosis. No masses, redness, swelling, asymmetry, or associated skin lesions. No contractures.  Functional ROM: Unrestricted ROM          Functional ROM: Decreased ROM for shoulder  Muscle Tone/Strength: Functionally intact. No obvious neuro-muscular anomalies detected.  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  Sensory (Neurological): Movement-associated discomfort          Palpation: No palpable anomalies              Palpation: Tender              Provocative Test(s):  Phalen's test: deferred Tinel's test: deferred Apley's scratch test (touch opposite shoulder):  Action 1 (Across chest): deferred Action 2 (Overhead): deferred Action 3 (LB reach): deferred   Provocative Test(s):  Phalen's test: deferred Tinel's test: deferred Apley's scratch test (touch opposite shoulder):  Action 1 (Across chest): deferred Action 2 (Overhead): deferred Action 3 (LB reach): deferred    Thoracic Spine Area Exam  Skin & Axial Inspection: No masses, redness, or swelling Alignment: Symmetrical Functional ROM: Unrestricted ROM Stability: No instability detected Muscle Tone/Strength: Functionally intact. No obvious neuro-muscular anomalies detected. Sensory (Neurological): Unimpaired Muscle strength & Tone: No palpable anomalies  Lumbar Spine Area Exam  Skin & Axial Inspection: No masses, redness, or swelling Alignment: Symmetrical Functional ROM: Unrestricted ROM       Stability: No instability detected Muscle Tone/Strength: Functionally intact. No obvious neuro-muscular anomalies detected. Sensory (Neurological): Unimpaired Palpation: No palpable anomalies       Provocative Tests: Hyperextension/rotation test: deferred today       Lumbar quadrant test (Kemp's test): deferred today       Lateral bending test: deferred today       Patrick's Maneuver: deferred today                    FABER* test: deferred today                   S-I anterior distraction/compression test: deferred today         S-I lateral compression test: deferred today         S-I Thigh-thrust test: deferred today         S-I Gaenslen's test: deferred today         *(Flexion, ABduction and External Rotation)  Gait & Posture Assessment  Ambulation: Unassisted Gait: Relatively normal for age and body habitus Posture: WNL   Lower Extremity Exam    Side: Right lower extremity  Side: Left lower extremity  Stability: No instability observed          Stability: No instability observed          Skin & Extremity Inspection: Skin color, temperature, and hair growth are WNL. No peripheral edema or cyanosis. No masses, redness, swelling, asymmetry, or associated skin lesions. No contractures.  Skin & Extremity Inspection: Skin color, temperature, and hair growth are WNL. No peripheral edema or cyanosis. No masses, redness, swelling, asymmetry, or associated skin lesions. No contractures.  Functional ROM: Unrestricted ROM                  Functional ROM: Unrestricted ROM                  Muscle Tone/Strength: Functionally intact. No obvious neuro-muscular anomalies detected.  Muscle Tone/Strength: Functionally intact. No obvious neuro-muscular anomalies detected.  Sensory (Neurological): Unimpaired        Sensory (Neurological): Unimpaired        DTR: Patellar: deferred today Achilles: deferred today Plantar: deferred today  DTR: Patellar: deferred today Achilles: deferred today Plantar: deferred today  Palpation: No palpable anomalies  Palpation: No palpable anomalies   Assessment  Primary Diagnosis & Pertinent Problem List: The primary encounter diagnosis was Chronic upper back pain. 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), Chronic upper extremity pain (Secondary Area of Pain) (Left), Chronic shoulder pain (Primary Area of Pain) (Left), Chronic tendinitis of rotator cuff (Left), Supraspinatus tendonitis (Left), Infraspinatus tendonitis (Left), Subacromial bursitis (Left), Subdeltoid bursitis (Left), Traumatic tear of rotator cuff, sequela (Left), Effusion of glenohumeral joint (Left), Osteoarthritis of AC (acromioclavicular) joint (Left), History of traumatic closed nondisplaced fracture of greater tuberosity of humerus (Left), and Chronic low back pain (Tertiary Area of Pain) (Right) were also pertinent to this visit.  Status Diagnosis  Worsening Unimproved Persistent 1. Chronic upper back pain   2. Pain of rhomboid muscle   3. Cervicalgia   4. Abnormal x-ray of cervical spine   5. Osteoarthritis of cervical spine, unspecified spinal osteoarthritis complication status   6. DDD (degenerative disc disease), cervical   7. Cervical Grade 1 Anterolisthesis of C4/C5 & C5/C6.   8. Cervical facet arthropathy (Bilateral)   9. Cervical facet syndrome (Bilateral)   10. 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 Chronic upper extremity pain (Secondary Area of Pain) (Left)   12. Chronic shoulder pain (Primary Area of Pain) (Left)   13. Chronic tendinitis of rotator cuff (Left)   14. Supraspinatus tendonitis (Left)   15. Infraspinatus tendonitis (Left)   16. Subacromial bursitis (Left)   17. Subdeltoid bursitis (Left)   18. Traumatic tear of rotator cuff, sequela (Left)   19. Effusion of glenohumeral joint (Left)   20. Osteoarthritis of AC (acromioclavicular) joint (Left)   21. History of traumatic closed nondisplaced fracture of greater tuberosity of humerus (Left)   22. Chronic low back pain (Tertiary Area of Pain)  (Right)     Problems updated and reviewed during this visit: Problem  Abnormal X-Ray of Cervical Spine   FINDINGS: 3 mm degenerative anterolisthesis at C5-6. With flexion, 3 mm of degenerative anterolisthesis occurs at C4-5 in the anterolisthesis at C5-6 increases to 5 mm. With extension, the C4-5 slippage reduces an the C5-6 slippage returns 2 3 mm. There is facet arthropathy at C4-5 and C5-6 responsible for this. There is osteophytic foraminal encroachment at these 2 levels as well, worse on the right than the left. Left-sided facet arthritis is more pronounced at C3-4 and C4-5 and right-sided facet arthritis is more pronounced at C4-5 and C5-6. there is only mild disc space narrowing at C4-5.  IMPRESSION: Facet osteoarthritis, more pronounced on the left at C3-4 and C4-5 and on the right at C4-5 and C5-6. Anterolisthesis at C4-5 and C5-6 at worsens with flexion as described above. Foraminal narrowing most pronounced on the right at C4-5 and C5-6 due to osteophytic encroachment.   Cervical Grade 1 Anterolisthesis of C4/C5 & C5/C6.   FINDINGS: 3 mm degenerative anterolisthesis at C5-6. With flexion, 3 mm of degenerative anterolisthesis occurs at C4-5 in the anterolisthesis at C5-6 increases to 5 mm. With extension, the C4-5 slippage reduces an the C5-6 slippage returns 2 3 mm.  IMPRESSION: Anterolisthesis at C4-5 and C5-6 at worsens with flexion as described above.   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 predniSONE (DELTASONE) 20 MG tablet    Sig: Take 3 tab(s) in the morning x 3 days, then 2 tab(s) x 3 days, followed by 1 tab x 3 days.    Dispense:  21 tablet    Refill:  0    Do not add to the "Automatic Refill"  notification system.  Marland Kitchen tiZANidine (ZANAFLEX) 4 MG tablet    Sig: Take 1 tablet (4 mg total) by mouth every 8 (eight) hours as needed for muscle spasms.    Dispense:  90 tablet    Refill:  0    Do not place this medication, or any other prescription from our practice, on "Automatic Refill". Patient may have prescription filled one day early if pharmacy is closed on scheduled refill date.   Medications administered today: Connye Burkitt. Clewis had no medications administered during this visit.  Procedure Orders    No procedure(s) ordered today   Lab Orders  No laboratory test(s) ordered today   Imaging Orders     DG Cervical Spine With Flex & Extend     DG Thoracic Spine 2 View  Referral Orders     Ambulatory referral to Neurosurgery Interventional management options: Planned, scheduled, and/or pending:   Diagnostic X-rays of Cervical and Thoracic Spine, followed by an MRI in the event that pathology is found. Steroid pack Return after studies.  At that time, depending on the results, then will decide what the next step will be in terms of interventional therapies.   Considering:   Diagnostic (Midline) cervical ESI  Diagnostic bilateral cervical facet blocks  Possible bilateral cervical facet RFA  Diagnostic left supraspinatus tendon sheath injection  Diagnostic left infraspinatus tendon sheath injection  Diagnostic left subacromial bursa injection  Diagnostic left subdeltoid bursa injection  Diagnostic left acromioclavicular joint injection  Diagnostic left suprascapular nerve block  Possible left suprascapular RFA  Diagnostic bilateral lumbar facet block  Possible bilateral lumbar facet RFA    Palliative PRN treatment(s):   None at this time   Provider-requested follow-up: Return for F/U eval (after test completion).  Future Appointments  Date Time Provider [place]  03/27/2018  9:45 AM Milinda Pointer, MD [facility] None   Primary Care Physician: Rusty Aus, MD Location: [facility] Note by: Gaspar Cola, MD Date: 03/08/2018; Time: 12:00 PM

## 2018-03-08 ENCOUNTER — Encounter: Payer: Self-pay | Admitting: Pain Medicine

## 2018-03-08 ENCOUNTER — Ambulatory Visit
Admission: RE | Admit: 2018-03-08 | Discharge: 2018-03-08 | Disposition: A | Discharge status: Home / Self Care | Payer: Medicare Other | Attending: Pain Medicine | Admitting: Pain Medicine

## 2018-03-08 ENCOUNTER — Ambulatory Visit
Admission: RE | Admit: 2018-03-08 | Discharge: 2018-03-08 | Disposition: A | Discharge status: Home / Self Care | Payer: Medicare Other | Source: Ambulatory Visit | Attending: Pain Medicine | Admitting: Pain Medicine

## 2018-03-08 ENCOUNTER — Ambulatory Visit (HOSPITAL_BASED_OUTPATIENT_CLINIC_OR_DEPARTMENT_OTHER): Payer: Medicare Other | Admitting: Pain Medicine

## 2018-03-08 ENCOUNTER — Other Ambulatory Visit: Payer: Self-pay

## 2018-03-08 VITALS — BP 113/68 | HR 70 | Temp 97.9°F | Ht 65.0 in | Wt 117.0 lb

## 2018-03-08 DIAGNOSIS — M7918 Myalgia, other site: Secondary | ICD-10-CM | POA: Insufficient documentation

## 2018-03-08 DIAGNOSIS — F3341 Major depressive disorder, recurrent, in partial remission: Secondary | ICD-10-CM | POA: Insufficient documentation

## 2018-03-08 DIAGNOSIS — G8929 Other chronic pain: Secondary | ICD-10-CM | POA: Insufficient documentation

## 2018-03-08 DIAGNOSIS — K449 Diaphragmatic hernia without obstruction or gangrene: Secondary | ICD-10-CM | POA: Insufficient documentation

## 2018-03-08 DIAGNOSIS — Z79899 Other long term (current) drug therapy: Secondary | ICD-10-CM | POA: Insufficient documentation

## 2018-03-08 DIAGNOSIS — J329 Chronic sinusitis, unspecified: Secondary | ICD-10-CM

## 2018-03-08 DIAGNOSIS — M79602 Pain in left arm: Secondary | ICD-10-CM

## 2018-03-08 DIAGNOSIS — M779 Enthesopathy, unspecified: Secondary | ICD-10-CM | POA: Insufficient documentation

## 2018-03-08 DIAGNOSIS — K8681 Exocrine pancreatic insufficiency: Secondary | ICD-10-CM

## 2018-03-08 DIAGNOSIS — M810 Age-related osteoporosis without current pathological fracture: Secondary | ICD-10-CM

## 2018-03-08 DIAGNOSIS — M549 Dorsalgia, unspecified: Secondary | ICD-10-CM

## 2018-03-08 DIAGNOSIS — M546 Pain in thoracic spine: Secondary | ICD-10-CM | POA: Insufficient documentation

## 2018-03-08 DIAGNOSIS — M4802 Spinal stenosis, cervical region: Secondary | ICD-10-CM

## 2018-03-08 DIAGNOSIS — M4312 Spondylolisthesis, cervical region: Secondary | ICD-10-CM

## 2018-03-08 DIAGNOSIS — I10 Essential (primary) hypertension: Secondary | ICD-10-CM | POA: Insufficient documentation

## 2018-03-08 DIAGNOSIS — Z8673 Personal history of transient ischemic attack (TIA), and cerebral infarction without residual deficits: Secondary | ICD-10-CM

## 2018-03-08 DIAGNOSIS — B9689 Other specified bacterial agents as the cause of diseases classified elsewhere: Secondary | ICD-10-CM | POA: Insufficient documentation

## 2018-03-08 DIAGNOSIS — M19012 Primary osteoarthritis, left shoulder: Secondary | ICD-10-CM

## 2018-03-08 DIAGNOSIS — M79601 Pain in right arm: Secondary | ICD-10-CM | POA: Insufficient documentation

## 2018-03-08 DIAGNOSIS — M545 Low back pain: Secondary | ICD-10-CM

## 2018-03-08 DIAGNOSIS — M5030 Other cervical disc degeneration, unspecified cervical region: Secondary | ICD-10-CM

## 2018-03-08 DIAGNOSIS — Z885 Allergy status to narcotic agent status: Secondary | ICD-10-CM | POA: Insufficient documentation

## 2018-03-08 DIAGNOSIS — M7552 Bursitis of left shoulder: Secondary | ICD-10-CM

## 2018-03-08 DIAGNOSIS — G894 Chronic pain syndrome: Secondary | ICD-10-CM

## 2018-03-08 DIAGNOSIS — M19011 Primary osteoarthritis, right shoulder: Secondary | ICD-10-CM | POA: Insufficient documentation

## 2018-03-08 DIAGNOSIS — R937 Abnormal findings on diagnostic imaging of other parts of musculoskeletal system: Secondary | ICD-10-CM | POA: Diagnosis not present

## 2018-03-08 DIAGNOSIS — K219 Gastro-esophageal reflux disease without esophagitis: Secondary | ICD-10-CM

## 2018-03-08 DIAGNOSIS — S42255S Nondisplaced fracture of greater tuberosity of left humerus, sequela: Secondary | ICD-10-CM | POA: Insufficient documentation

## 2018-03-08 DIAGNOSIS — E785 Hyperlipidemia, unspecified: Secondary | ICD-10-CM

## 2018-03-08 DIAGNOSIS — M542 Cervicalgia: Secondary | ICD-10-CM

## 2018-03-08 DIAGNOSIS — Z79891 Long term (current) use of opiate analgesic: Secondary | ICD-10-CM

## 2018-03-08 DIAGNOSIS — M47812 Spondylosis without myelopathy or radiculopathy, cervical region: Secondary | ICD-10-CM | POA: Insufficient documentation

## 2018-03-08 DIAGNOSIS — M25412 Effusion, left shoulder: Secondary | ICD-10-CM | POA: Insufficient documentation

## 2018-03-08 DIAGNOSIS — Z823 Family history of stroke: Secondary | ICD-10-CM

## 2018-03-08 DIAGNOSIS — M50321 Other cervical disc degeneration at C4-C5 level: Secondary | ICD-10-CM | POA: Insufficient documentation

## 2018-03-08 DIAGNOSIS — N6019 Diffuse cystic mastopathy of unspecified breast: Secondary | ICD-10-CM | POA: Insufficient documentation

## 2018-03-08 DIAGNOSIS — E7800 Pure hypercholesterolemia, unspecified: Secondary | ICD-10-CM | POA: Insufficient documentation

## 2018-03-08 DIAGNOSIS — S46012S Strain of muscle(s) and tendon(s) of the rotator cuff of left shoulder, sequela: Secondary | ICD-10-CM

## 2018-03-08 DIAGNOSIS — M25512 Pain in left shoulder: Secondary | ICD-10-CM

## 2018-03-08 DIAGNOSIS — M4310 Spondylolisthesis, site unspecified: Secondary | ICD-10-CM

## 2018-03-08 DIAGNOSIS — M7582 Other shoulder lesions, left shoulder: Secondary | ICD-10-CM | POA: Insufficient documentation

## 2018-03-08 DIAGNOSIS — M7592 Shoulder lesion, unspecified, left shoulder: Secondary | ICD-10-CM

## 2018-03-08 DIAGNOSIS — Z5181 Encounter for therapeutic drug level monitoring: Secondary | ICD-10-CM

## 2018-03-08 DIAGNOSIS — M79646 Pain in unspecified finger(s): Secondary | ICD-10-CM

## 2018-03-08 MED ORDER — TIZANIDINE HCL 4 MG PO TABS: 4.0000 mg | ORAL_TABLET | Freq: Three times a day (TID) | ORAL | 0 refills | Status: AC | PRN

## 2018-03-08 MED ORDER — PREDNISONE 20 MG PO TABS: ORAL_TABLET | ORAL | 0 refills | Status: AC

## 2018-03-08 NOTE — Unmapped (Signed)
Adult Cystic Fibrosis Clinic Pharmacist Note      Primary pulmonologist: Felicie Morn is a 72 y.o. female with cystic fibrosis (genotype: R117H (5T)/3272-26A-g) being seen for annual medication review.    Height and Weight:  Height:  163 cm (5' 4.17)  Weight:  54 kg (119 lb)    Lung function:   Date FEV1   03/03/17 84.3%   03/09/18 86.6%     Sputum culture history:   07/15/16: Mucoid Pseudomonas aeruginosa, Smooth Pseudomonas aeruginosa, OPF  03/03/17: Mucoid Pseudomonas aeruginosa, Smooth Pseudomonas aeruginosa, M. avium complex, OPF    Last AFB:   03/03/17: M. avium complex    Exacerbations:  2019   Month/Yr Antibiotics   11/19 Augmentin (stomach bug, d/t spoiled sour cream)     Last Vitamin Levels:   Date A D (25OH) E   04/07/15 74.5 mcg/dL 32 ng/mL 47.4 mg/L   25/95/63 66.3 mcg/dL  --- 22 mg/L     Last OGTT: WNL  Date Fasting 2-Hour   07/22/16 95 mg/dL 89 mg/dL     Last LFTs:  Lab Results   Component Value Date    ALKPHOS 117 07/22/2016    BILITOT 0.5 07/22/2016    BILIDIR 0.3 04/02/2014    PROT 7.2 07/22/2016    ALBUMIN 4.5 07/22/2016    ALT 16 07/22/2016    AST 26 07/22/2016     Medication Review    Reviewed the indication, dose, and frequency of each medication with patient.   All medications, allergies, and preferred pharmacy list were updated in EPIC medication profile    Allergies:   Allergies   Allergen Reactions   ??? Tobramycin Shortness Of Breath     wheezing   ??? Tobi [Tobramycin In 0.225 % Nacl] Other (See Comments)     wheezing  wheezing   ??? Codeine Itching and Nausea Only   ??? Galantamine Nausea Only   ??? Hydrocodone Itching     INHALED MEDICATIONS     BRONCHODILATOR  ? Albuterol HFA Inhaler: Q6H PRN.  Taking PRN, last used several months ago  MUCOLYTICS    ? Hypertonic Saline 10%: BID.  Taking differently: Daily (AM)  INHALED ANTIBIOTICS  ? [place] (inhaled aztreonam) TID. Not taking, several years.  OTHER  ? None     CHRONIC MEDICATIONS       CFTR MODULATOR   ? [name] 150mg  tablet: 1 tablet BID. Taking as prescribed with fatty food     ID/ABX   Chronic anti-inflammatory and/or suppressive antibiotics:  ? None   NTM:  ? AFB positive, not on treatment at this time     FEN/GI     Pancreatic Enzyme:   ? Not applicable (pancreatic sufficient)   Reflux/GERD:  ? Omeprazole 40mg  daily. Not taking.  ? Ranitidine 300mg  daily. Not taking.   Appetite/Other:   ? Cyproheptadine 4mg  TID. Taking differently than prescribed: PRN (1 dose a day). feels like a zombie if takes too much.   Vitamins:   ? Calcium 1 capsule(s) daily (misses doses on occasion)  ? OTC Multivitamin:1 tab/cap daily (denies missed doses)  ? Vitamin D3 5000 units daily (hasn't had in awhile)  Gets CF vitamins from: OTC     ENDOCRINE/BONE     Bone Disease/Other:  ? Risedronate 150mg  monthly. Denies missed doses.     NEURO/PSYCH      ? Galantamine 4mg  daily . Not taking; felt like it make her sick.  ? Zolpidem CR  12.5mg  nightly as needed. Not taking, stopped per PCP.  ? Sertraline 100mg  daily for hot flashes. Denies missed doses.   ? Temazepam 15mg  nightly. Denies missed doses.   ? Tizandine 4mg  (disp 90 tabs) new RX.  ? Prednisone 20mg  taper, new RX per pain MD  ? Naproxen 500mg  BID PRN pain from shoulder pain.     OTHER     ? Enalapril 2.5mg  daily. Denies missed doses.  ? Metoprolol XL 25mg  BID. Denies missed doses.   ? Simvastatin 80mg  nightly.  Denies missed doses.   ? Tretinoin 0.05% gel nightly.  Taking PRN.       Patient identified barriers to adherence:  ? Side effects/intolerance: Periactin causes loopiness    Reported Side Effects: Yes: see above     Assessment/Recommendations     CYSTIC FIBROSIS WITH PULMONARY INVOLVEMENT:   ? Mild lung function (FEV1 70-89%). Lung function has improved.  ? Inhaled Therapies: Airway clearance medications as above. No changes, continue.  ? CFTR Modulator: Stable on CFTR Modulator: [name], LFTs were last done 12/18, next due now. No changes, continue current therapy.     ID/ABX:   ? Primary grows Mucoid Pseudomonas aeruginosa, Smooth Pseudomonas aeruginosa, OPF. Does not have frequent exacerbations.   ? Cycled Antibiotics: No cycled antibiotics. No changes, not on therapy.  ? NTM History: Positive AFBs but not currently treating. No changes, not on therapy.    FEN/GI:   ? GERD: Was prescribed PPI and H2RA, but no longer taking. No changes, not currently on therapy.  ? Vitamins: Last vitamin levels not up to date, need to recheck. Does not require CF-specific vitamin.  No changes, continue current OTC regimen. She will need to get more D3 OTC.  ? Appetite Stimulant: Only taking cyproheptadine daily but makes her feel like a zombie. Dronabinol is unfortunately not an option as Medicare D will not approve off-label use. Recommended starting mirtazapine 7.5mg  QHS, if tolerates x 1 week, then increase to 15mg  QHS.    ENDOCRINE:  ? CFRD Screening: OGTT is not up to date, needs to complete.   ? Bone Disease: On bisphosphonate and using appropriately.    Pertinent drug Interactions noted: No   Recommended labs to be monitored: Yes-Annual labs (LFTs, vitamin levels, OGTT)     Medication management:  Adherence: Moderate compliance: Minor variances to doses prescribed, misses occasional doses during the week.  Understands how to refill medications and all assistance programs: Yes, her husband helps manage ordering of medications.    Access of CF Medications   ? [name] fills at [facility]    Eligible Copay Assistance Program(s):   ? [employer] CF Treatment Grant through 02/18/19  ? [employer] CF Vitamins/Supplements Grant through 02/18/19  ? [employer] Program  ? The Assistance Fund through 03/22/19    Summary of Chronic Medication Changes:  ? STOP cyproheptadine.  ? START mirtazapine 7.5mg  QHS.    Time spent with patient: 20 minutes    Anell Barr, PharmD, BCPS, CPP  Clinical Pharmacist Practitioner  [facility] Adult Cystic Fibrosis Clinic  Pager: [phone]

## 2018-03-09 ENCOUNTER — Ambulatory Visit: Admit: 2018-03-09 | Discharge: 2018-03-10 | Payer: MEDICARE | Attending: Registered" | Primary: Registered"

## 2018-03-09 ENCOUNTER — Ambulatory Visit: Admit: 2018-03-09 | Discharge: 2018-03-10 | Payer: MEDICARE

## 2018-03-09 ENCOUNTER — Ambulatory Visit: Admit: 2018-03-09 | Discharge: 2018-03-10 | Payer: MEDICARE | Attending: Pharmacist | Primary: Pharmacist

## 2018-03-09 DIAGNOSIS — Z79899 Other long term (current) drug therapy: Secondary | ICD-10-CM

## 2018-03-09 DIAGNOSIS — R630 Anorexia: Secondary | ICD-10-CM

## 2018-03-09 DIAGNOSIS — R791 Abnormal coagulation profile: Secondary | ICD-10-CM

## 2018-03-09 DIAGNOSIS — E168 Other specified disorders of pancreatic internal secretion: Secondary | ICD-10-CM

## 2018-03-09 DIAGNOSIS — S42295S Other nondisplaced fracture of upper end of left humerus, sequela: Secondary | ICD-10-CM

## 2018-03-09 DIAGNOSIS — M4802 Spinal stenosis, cervical region: Secondary | ICD-10-CM | POA: Insufficient documentation

## 2018-03-09 DIAGNOSIS — S42302A Unspecified fracture of shaft of humerus, left arm, initial encounter for closed fracture: Secondary | ICD-10-CM | POA: Insufficient documentation

## 2018-03-09 DIAGNOSIS — M4310 Spondylolisthesis, site unspecified: Secondary | ICD-10-CM | POA: Insufficient documentation

## 2018-03-09 DIAGNOSIS — R937 Abnormal findings on diagnostic imaging of other parts of musculoskeletal system: Secondary | ICD-10-CM | POA: Insufficient documentation

## 2018-03-09 DIAGNOSIS — M47812 Spondylosis without myelopathy or radiculopathy, cervical region: Secondary | ICD-10-CM | POA: Insufficient documentation

## 2018-03-09 LAB — COMPREHENSIVE METABOLIC PANEL
ALBUMIN: 4.3 g/dL (ref 3.5–5.0)
ALKALINE PHOSPHATASE: 93 U/L (ref 38–126)
ALT (SGPT): 12 U/L (ref <35)
ANION GAP: 15 mmol/L (ref 7–15)
AST (SGOT): 22 U/L (ref 14–38)
BILIRUBIN TOTAL: 0.6 mg/dL (ref 0.0–1.2)
BLOOD UREA NITROGEN: 22 mg/dL — ABNORMAL HIGH (ref 7–21)
BUN / CREAT RATIO: 23
CALCIUM: 9.9 mg/dL (ref 8.5–10.2)
CHLORIDE: 101 mmol/L (ref 98–107)
CO2: 22 mmol/L (ref 22.0–30.0)
EGFR CKD-EPI AA FEMALE: 69 mL/min/{1.73_m2} (ref >=60)
EGFR CKD-EPI NON-AA FEMALE: 60 mL/min/{1.73_m2} (ref >=60)
GLUCOSE RANDOM: 118 mg/dL (ref 70–179)
POTASSIUM: 4.5 mmol/L (ref 3.5–5.0)
PROTEIN TOTAL: 7.7 g/dL (ref 6.5–8.3)
SODIUM: 138 mmol/L (ref 135–145)

## 2018-03-09 LAB — CBC W/ AUTO DIFF
BASOPHILS ABSOLUTE COUNT: 0 10*9/L (ref 0.0–0.1)
EOSINOPHILS ABSOLUTE COUNT: 0 10*9/L (ref 0.0–0.4)
HEMOGLOBIN: 12.5 g/dL (ref 12.0–16.0)
LARGE UNSTAINED CELLS: 0 % (ref 0–4)
LYMPHOCYTES ABSOLUTE COUNT: 0.6 10*9/L — ABNORMAL LOW (ref 1.5–5.0)
LYMPHOCYTES RELATIVE PERCENT: 4.5 %
MEAN CORPUSCULAR HEMOGLOBIN CONC: 31.2 g/dL (ref 31.0–37.0)
MEAN CORPUSCULAR HEMOGLOBIN: 27.8 pg (ref 26.0–34.0)
MEAN CORPUSCULAR VOLUME: 89 fL (ref 80.0–100.0)
MEAN PLATELET VOLUME: 7.5 fL (ref 7.0–10.0)
MONOCYTES ABSOLUTE COUNT: 0.2 10*9/L (ref 0.2–0.8)
MONOCYTES RELATIVE PERCENT: 1.7 %
NEUTROPHILS ABSOLUTE COUNT: 11.5 10*9/L — ABNORMAL HIGH (ref 2.0–7.5)
NEUTROPHILS RELATIVE PERCENT: 93.4 %
PLATELET COUNT: 366 10*9/L (ref 150–440)
RED BLOOD CELL COUNT: 4.5 10*12/L (ref 4.00–5.20)
RED CELL DISTRIBUTION WIDTH: 14.3 % (ref 12.0–15.0)
WBC ADJUSTED: 12.3 10*9/L — ABNORMAL HIGH (ref 4.5–11.0)

## 2018-03-09 LAB — PROTIME: Lab: 11.8

## 2018-03-09 LAB — ALT (SGPT): Alanine aminotransferase:CCnc:Pt:Ser/Plas:Qn:: 12

## 2018-03-09 LAB — RED BLOOD CELL COUNT: Lab: 4.5

## 2018-03-09 MED ORDER — DRONABINOL 2.5 MG CAPSULE
ORAL_CAPSULE | Freq: Two times a day (BID) | ORAL | 5 refills | 0.00000 days | Status: CP
Start: 2018-03-09 — End: 2018-03-09

## 2018-03-09 MED ORDER — SODIUM CHLORIDE 10 % FOR NEBULIZATION
3 refills | 0 days | Status: CP
Start: 2018-03-09 — End: ?
  Filled 2018-05-11: qty 750, 30d supply, fill #0

## 2018-03-09 MED ORDER — MIRTAZAPINE 15 MG TABLET
ORAL_TABLET | Freq: Every evening | ORAL | 6 refills | 0.00000 days | Status: CP
Start: 2018-03-09 — End: 2018-03-13

## 2018-03-09 NOTE — Unmapped (Signed)
[facility]  Adult Cystic Fibrosis Clinic        SW REFERRAL SOURCE/REASON: Alyssa Willis is a 72 y.o. female followed by [facility] for her CF. Focus of conversation today is conducting assessment and addressing psychosocial needs.    CURRENT LIVING ARRANGEMENTS/SUPPORTS:  Alyssa Willis and her husband live together with their two dogs.      TRANSPORTATION: No difficulties with transportation.    EDUCATION/EMPLOYMENT/FINANCIAL STATUS: Alyssa Willis and her husband are both retired.  They deny any financial difficulties at this time.    HOBBIES/INTERESTS:  Alyssa Willis enjoys spending time with her dogs.  She reports being unable to enjoy many other activities due to her back pain.    INSURANCE: Alyssa Willis has medicare.  She also has the [employer].  She denies any difficulty managing healthcare costs.    FOOD INSECURITY: Denied.    MENTAL HEALTH/COPING: Alyssa Willis did not complete formal mental health screenings, but was open to discussing her mental health.  Alyssa Willis denies depression or anxiety in general, but does describe being down due to chronic back pain and inability to find relief.  She recently had appointment with pain clinic, but does not have high hopes that this will be helpful.  Alyssa Willis was tearful throughout conversation, so we discussed the idea of medication or therapy, but Alyssa Willis doesn't feel that either would be helpful.  We discussed potential benefits of mindfulness or guided imagery to manage pain, but she doesn't feel that these would work either.  Alyssa Willis's husband reports that, in his opinion, she manages fairly well from a mental health perspective.  We agreed to continue to check-in periodically re: mental health and Alyssa Willis has my contact information if she'd like to explore resources for treatment in the future.    SUBSTANCE USE:  Alyssa Willis did not complete formal substance use screenings. She denies any difficulty with substance use.     LONG-TERM HEALTH CARE PLANNING:  I provided LTHCP booklet at previous clinic appointment and followed-up on conversation today.  Alyssa Willis reports that she is not interested in a formal LTHCP meeting, but has completed ACP documents.  Her husband confirms that they've worked with a Clinical research associate to complete ACP documents.      PLAN:  Continue to provide psychosocial support.      Alyssa Moore, LCSW

## 2018-03-09 NOTE — Unmapped (Signed)
Cystic Fibrosis Nutrition Assessment    Outpatient: MD Consult this visit related to cystic fibrosis protocol - poor PO intake, weight loss  Primary Pulmonologist: Dr. Curley Spice  ===================================================================  Alyssa Willis is a 72 y.o. female seen for medical nutrition therapy related to Cystic Fibrosis. Accompanied in clinic today by her husband.  ===================================================================  INTERVENTION:  1. Encouraged intake of all meals/shakes.   - MD to change appetite stimulant to remeron nightly. She is to monitor weight/appetite and notify CF team as needed. Also discussed marinol, not covered by insurance. She and husband may consider paying out of pocket for marinol should she she not do well with remeron.   - Continue Ensure Plus, encouraged 3 daily. [name] and her husband would like to purchase supplements and submit to [employer] for reimbursement on their own.    2. Checking fat soluble vitamin levels. RD to follow up with [name] re: results and changes if needed.    3. Continue remainder of nutrition regimen:  - High Calorie Diet  - acid reducer    Outpatient:  Time Spent (minutes): 15   Will follow up with patient per nutrition risk protocol for CF  ===================================================================  ASSESSMENT:  Cystic Fibrosis Nutrition Category = Acceptable    Current diet is not appropriate for CF. Current PO intake is improving and closer to meeting estimated CF needs. Patient to benefit from addition of appetite stimulant. Patient would benefit from PO supplement for additional calories. Patient not meeting goal for CF weight management. Patient continues to work towards goals for weight management.   Enzyme replacement not necessary for this pancreatic sufficient patient. Vitamin prescription is appropriate to reach/maintain optimal fat soluble vitamin levels. Sodium needs for CF met with PO and/or supplement. Acid reducer appropriate for GERD and enzyme activation. Patient on CFTR modulator is consuming adequate amounts of fat-containing foods with prescribed medication to optimize absorption.    Malnutrition Assessment using AND/ASPEN Clinical Characteristics:  Declined.    Goals:  1. Meet estimated daily needs: 2207 kcals as outpatient considering higher activity factor; 64-86 gm pro; 2180 mL free water       Calories estimated using: Cystic Fibrosis Conference Formula, fluid per Holiday Segar, protein per DRI x 1.5-2  2. Reach/maintain established goals for CF:                Adult - BMI 22 kg/m2 for CF females and 23 kg/m2 for CF males    Pediatric - BMI or wt/ht ratio > 50%ile for age  64. Normal fat-soluble vitamin levels: Vitamin A, Vitamin E and PT per lab range; Vitamin D 25OH total >30  4. Maintain glucose control. Carbohydrate content of diet should comprise 40-50% of total calorie needs, but carbohydrates are not restricted in this population.    5.  Meet sodium needs for CF  ===================================================================  CLINICAL DATA:  Past Medical History:   Diagnosis Date   ??? Cervical disc disease    ??? Chronic kidney disease (CKD), stage I    ??? Chronic sinusitis    ??? Cystic fibrosis (CMS-HCC)    ??? Dry eyes    ??? 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 GERD (gastroesophageal reflux disease)    ??? Headache(784.0)    ??? History of facelift    ??? Hyperlipidemia    ??? Hypertension    ??? Mycobacterium avium complex (CMS-HCC)    ??? Osteoporosis      Anthroprometric Evaluation:  Weight  changes: Weight is down 6 pounds (4.8%) over the last ~2 months. However note overall weight is up 7 pounds form this time last year. [name] states she feels good at current weight.    Current BMI = 20.32 kg/m2  Wt Readings from Last 12 Encounters:   03/09/18 54 kg (119 lb)   01/12/18 56.9 kg (125 lb 6.4 oz)   04/06/17 54.4 kg (120 lb)   03/03/17 50.8 kg (112 lb)   01/05/17 54.9 kg (121 lb)   07/15/16 54.9 kg (121 lb)   07/08/16 55.4 kg (122 lb 3.2 oz)   05/25/16 54.4 kg (120 lb)   04/01/16 54.4 kg (120 lb)   12/16/15 54.7 kg (120 lb 8 oz)   11/27/15 54.6 kg (120 lb 6.4 oz)   11/11/15 53.5 kg (118 lb)     Ht Readings from Last 3 Encounters:   03/09/18 163 cm (5' 4.17)   04/06/17 165.1 cm (5' 5)   03/03/17 162.6 cm (5' 4)   ==================================================================  Energy Intake (outpatient):  Diet: High in calories, fat, d& salt. Diet evaluated this visit. Appetite improved with cyproheptadine, however she report this med makes her feel like a zombie.  Food allergies: none known  Diet and CFTR modulators: Prescribed [name] (ivacaftor). Takes with Ensure Plus shakes.  PO Supplements: 2-3 Ensure Plus per day; growing tired of Zone Bars does not eat these as often.   - Has been using [facility] for PO supplements but [name] and her husband would like to purchase supplements and submit to [employer] for reimbursement on their own.  Appetite Stimulant: cyproheptadine 4 mg once daily - feels like a zombie.  Enteral feeding tube: none  Sodium in diet: Adequate from diet  Calcium in diet: Adequate from diet Adequate from diet and supplement    Fat Malabsorption (outpatient):  Enzyme brand, (meals/snacks): patient is PS, has not required enzyme regimen for proper absorption   Stools: denies s/s of malabsorption  Abdominal pain: None reported  Fecal Fat Studies:     No results found for: ELAST  No results found for: PELAI  GI meds: Nutritionally relevant medications reviewed. Zantac.    Vitamins/Minerals (outpatient):  CF-specific MVI, dose, compliance: none   Other vitamins/minerals/herbals: vitamin D (she can not remember dose; should be 4000 International units daily).  - Also reports taking (but is unsure of doses) over the counter womens multivitamin, vitamin E, vitamin C, vitamin A.  Calcium supplement: yes  Patient Resources: -Lupita Shutter (phone [phone] [email or web address])  Fat-soluble vitamin levels:   Lab Results   Component Value Date/Time    VITAMINA 66.3 07/22/2016 1036    VITAMINA 74.5 03/28/2015 1332    VITAMINA 69.1 06/12/2014 1145    VITAMINA 72.1 04/16/2013 1248    VITAMINA 67.8 09/01/2012 1242     Lab Results   Component Value Date/Time    VITDTOTAL 32 03/28/2015 1332    VITDTOTAL 29 06/12/2014 1145    VITDTOTAL 28 09/01/2012 1242    VITDTOTAL 37 03/01/2011 1413   - Reports that vitamin D level was 21 drawn March 2018 at PCP - LOW.    Lab Results   Component Value Date/Time    VITAME 22.0 (H) 07/22/2016 1036    VITAME 12.4 03/28/2015 1332    VITAME 14.4 06/12/2014 1145    VITAME 10.4 05/31/2013 0922    VITAME 15.7 04/16/2013 1248     Lab Results   Component Value Date/Time    PT 11.1 07/22/2016 1036  PT 10.9 03/28/2015 1332    PT 10.7 03/01/2011 1413     Bone Health: Last DEXA showed osteoporosis. (2017)  - Reported to MD that states she has not been on her alendronate for many months now, but doesn't know why     CF Related Diabetes: No Hx of CFRD.  May 2018 OGTT (WNL): Fasting = 95, 2 hour level = 89   February 2017 OGTT (WNL): Fasting = 98, 2 hour level = 112

## 2018-03-09 NOTE — Unmapped (Signed)
1) get blood work done today    Harrel Carina, M.D.  Pulmonary/Critical Care Medicine  Pediatric Pulmonology  [facility]  [facility] Room [phone]; [place] [id number]  [address]  [place], [place] [address]    Thank you for your visit to the [facility] Pulmonary Clinics.  You may receive a survey from [facility] regarding your visit today, and we are eager to use this feedback to improve your experience.  Thank you for taking the time to fill it out.    If you have not already done so, please sign up for [facility]. I will respond to your messages as soon as I can, typically within 24 hours, but please allow up to 72 hours if you message is sent on a Friday afternoon or a holiday. Call the numbers below for urgent matters that require more immediate attention.    For the Cystic Fibrosis Nurse Line: [phone] (for appointments please call [phone])  For the Cystic Fibrosis Nurse fax: [phone]  For appointments/General Pulmonary Nurse: [phone]    For urgent issues after hours:  Hospital Operator: [phone], ask for Pulmonary Fellow on call

## 2018-03-09 NOTE — Unmapped (Signed)
Adult Cystic Fibrosis Clinic  Yearly Assessment    Current Inhaled Medications:     Albuterol HFA 14mcg/2puffs PRN  Hypertonic Saline 10% Daily    Home O2: none    Review order meds are taken, frequency, compliance: Patient is normally compliant.    Barriers to Compliance and Solutions: none      Airway Clearance Used: Has a chest vest and acapella but uses neither. She does not feel she needs these devices.       Airway Clearance/Not effective: n/a      Cough Techniques: Does not use any cough techniques.       Spacer/MDI training: The patient does not use a spacer. Watched a video and discussed. Is open to using this device. Gave one, with an Education officer, museum.       Bipap/Cpap;Settings if use: none    Equipment Review/Cleaning: She boils to sterilize her nebulizer cups and never re-uses a dirty cup.     DME Provider: none    Misc. Notes: The patient had no further needs or concerns today. Encouraged the patient to reach out should any arise.

## 2018-03-10 LAB — HEMOGLOBIN A1C: Hemoglobin A1c/Hemoglobin.total:MFr:Pt:Bld:Qn:: 5.6

## 2018-03-10 LAB — TOTAL IGE: Lab: <2 — ABNORMAL LOW

## 2018-03-13 ENCOUNTER — Ambulatory Visit: Admit: 2018-03-13 | Discharge: 2018-03-13 | Payer: MEDICARE

## 2018-03-13 ENCOUNTER — Telehealth: Payer: Self-pay

## 2018-03-13 LAB — VITAMIN D, TOTAL (25OH): Lab: 22

## 2018-03-13 MED ORDER — PEG-ELECTROLYTE SOLUTION 420 GRAM ORAL SOLUTION
0 refills | 0 days | Status: CP
Start: 2018-03-13 — End: 2019-03-14

## 2018-03-13 MED ORDER — MIRTAZAPINE 15 MG TABLET
ORAL_TABLET | Freq: Every evening | ORAL | 6 refills | 0 days | Status: CP
Start: 2018-03-13 — End: 2018-04-19

## 2018-03-13 NOTE — Telephone Encounter (Signed)
Call this patient and schedule her for a Cervical MRI. Let her know that the x-rays results would suggest some instability. In addition arrange for a referral to [facility] for evaluation. Make sure the Neurosurgery appointment is at least 1 week after her Cervical MRI so that they can get the results.   I received this from Dr Dossie Arbour.  I attempted to call this patient and I left a message for her to call back.  Im checking to make sure that he sent this to the secretaries to put in for the MRI and the neurosurgery consult.  Can someone please check this .  Thank you

## 2018-03-14 ENCOUNTER — Encounter: Admit: 2018-03-14 | Discharge: 2018-03-14 | Payer: MEDICARE | Attending: Anesthesiology | Primary: Anesthesiology

## 2018-03-14 ENCOUNTER — Ambulatory Visit: Admit: 2018-03-14 | Discharge: 2018-03-14 | Payer: MEDICARE

## 2018-03-14 DIAGNOSIS — Z1211 Encounter for screening for malignant neoplasm of colon: Principal | ICD-10-CM

## 2018-03-14 LAB — VITAMIN E LEVEL: Alpha tocopherol:MCnc:Pt:Ser/Plas:Qn:: 12.3

## 2018-03-16 LAB — VITAMIN A RESULT: Retinol:MCnc:Pt:Ser/Plas:Qn:: 55.8

## 2018-03-20 DIAGNOSIS — Z0000 Encounter for general adult medical examination without abnormal findings: Secondary | ICD-10-CM | POA: Insufficient documentation

## 2018-03-24 ENCOUNTER — Ambulatory Visit
Admission: RE | Admit: 2018-03-24 | Discharge: 2018-03-24 | Disposition: A | Discharge status: Home / Self Care | Payer: Medicare Other | Source: Ambulatory Visit | Attending: Pain Medicine | Admitting: Pain Medicine

## 2018-03-24 DIAGNOSIS — M542 Cervicalgia: Secondary | ICD-10-CM | POA: Diagnosis present

## 2018-03-24 DIAGNOSIS — G8929 Other chronic pain: Secondary | ICD-10-CM | POA: Diagnosis present

## 2018-03-24 DIAGNOSIS — M549 Dorsalgia, unspecified: Secondary | ICD-10-CM | POA: Insufficient documentation

## 2018-03-24 DIAGNOSIS — M7918 Myalgia, other site: Secondary | ICD-10-CM | POA: Diagnosis present

## 2018-03-26 NOTE — Progress Notes (Deleted)
Patient's Name: Brenda Clarke  MRN: [id number]  Referring Provider: Rusty Aus, MD  DOB: [birth year]  PCP: Rusty Aus, MD  DOS: 03/27/2018  Note by: Gaspar Cola, MD  Service setting: Ambulatory outpatient  Specialty: Interventional Pain Management  Location: [facility] (AMB) Pain Management Facility    Patient type: Established   Primary Reason(s) for Visit: Encounter for prescription drug management. (Level of risk: moderate)  CC: No chief complaint on file.  HPI  Brenda Clarke is a 73 y.o. year old, female patient, who comes today for a medication management evaluation. 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[place]); Cystic fibrosis ([place]); Essential hypertension; Fibrocystic breast disease; Gastro-esophageal reflux disease with esophagitis; Heartburn; Hyperlipidemia; Laceration of finger; Low serum vitamin D; Migraine without status migrainosus, not intractable; Monocular diplopia; DDD (degenerative disc disease), cervical; Chronic neck pain; Pain in finger; Prolapse of anterior vaginal wall; Recurrent major depressive disorder, in partial remission ([place]); Chronic low back pain [facility] Area of Pain) (Right); Chronic tendinitis of rotator cuff (Left); S/P cataract extraction and insertion of intraocular lens, left; Screen for colon cancer; Spondylosis of cervical region without myelopathy or radiculopathy; Tubular adenoma; Uterovaginal prolapse; Chronic shoulder pain (Primary Area of Pain) (Left); Chronic upper extremity pain (Secondary Area of Pain) (Left); Chronic pain syndrome; Long term current use of opiate analgesic; Pharmacologic therapy; Disorder of skeletal system; Problems influencing health status; Cervicalgia; Traumatic tear of rotator cuff, sequela (Left); Supraspinatus tendonitis (Left);  Infraspinatus tendonitis (Left); Osteoarthritis of AC (acromioclavicular) joint (Left); Subacromial bursitis (Left); Subdeltoid bursitis (Left); Effusion of glenohumeral joint (Left); History of traumatic closed nondisplaced fracture of greater tuberosity of humerus (Left); Chronic upper back pain; Pain of rhomboid muscle; Abnormal x-ray of cervical spine; Cervical Grade 1 Anterolisthesis of C4/C5 & C5/C6.; Cervical facet arthropathy (Bilateral); Cervical facet syndrome (Bilateral); 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 Her primarily concern today is the No chief complaint on file.  Pain Assessment: Location:     Radiating:   Onset:   Duration:   Quality:   Severity:  /10 (subjective, self-reported pain score)  Note: Reported level is compatible with observation.                         When using our objective Pain Scale, levels between 6 and 10/10 are said to belong in an emergency room, as it progressively worsens from a 6/10, described as severely limiting, requiring emergency care not usually available at an outpatient pain management facility. At a 6/10 level, communication becomes difficult and requires great effort. Assistance to reach the emergency department may be required. Facial flushing and profuse sweating along with potentially dangerous increases in heart rate and blood pressure will be evident. Effect on ADL:   Timing:   Modifying factors:   BP:    HR:    Brenda Clarke was last scheduled for an appointment on 03/08/2018 for medication management. During today's appointment we reviewed Brenda Clarke's chronic pain status, as well as her outpatient medication regimen.  The patient  reports no history of drug use. Her body mass index is unknown because there is no height or weight on file.  Further details on both, my assessment(s), as well as the proposed treatment plan, please see below.  Controlled Substance  Pharmacotherapy Assessment REMS (Risk  Evaluation and Mitigation Strategy)  Analgesic: none MME/day: 0 mg/day.  No notes on file Pharmacokinetics: N/A Pharmacodynamics: N/A Monitoring: [place] PMP: Online review of the past 58-monthperiod conducted. Compliant with practice rules and regulations Last UDS on record: Summary  Date Value Ref Range Status  12/01/2017 FINAL  Final    Comment:    ==================================================================== TOXASSURE COMP DRUG ANALYSIS,UR ==================================================================== Test                             Result       Flag       Units Drug Present and Declared for Prescription Verification   Sertraline                     PRESENT      EXPECTED   Desmethylsertraline            PRESENT      EXPECTED    Desmethylsertraline is an expected metabolite of sertraline.   Metoprolol                     PRESENT      EXPECTED Drug Present not Declared for Prescription Verification   Oxazepam                       >1099        UNEXPECTED ng/mg creat   Temazepam                      >1099        UNEXPECTED ng/mg creat    Oxazepam and temazepam are expected metabolites of diazepam.    Oxazepam is also an expected metabolite of other benzodiazepine    drugs, including chlordiazepoxide, prazepam, clorazepate,    halazepam, and temazepam.  Oxazepam and temazepam are available    as scheduled prescription medications.   Acetaminophen                  PRESENT      UNEXPECTED   Salicylate                     PRESENT      UNEXPECTED Drug Absent but Declared for Prescription Verification   Zolpidem                       Not Detected UNEXPECTED    Zolpidem, as indicated in the declared medication list, is not    always detected even when used as directed. ==================================================================== Test                      Result    Flag   Units      Ref Range   Creatinine              182               mg/dL      >=20 ==================================================================== Declared Medications:  The flagging and interpretation on this report are based on the  following declared medications.  Unexpected results may arise from  inaccuracies in the declared medications.  **Note: The testing scope of this panel includes these medications:  Metoprolol (Toprol)  Sertraline (Zoloft)  **Note: The testing scope of this panel does not include small to  moderate amounts of these reported medications:  Zolpidem (Ambien)  **Note: The  testing scope of this panel does not include following  reported medications:  Calcium citrate  Enalapril (Vasotec)  Galantamine (Razadyne)  Ivacaftor  Multivitamin (MVI)  Omega-3 Fatty Acids (Fish Oil)  Omeprazole (Prilosec)  Ranitidine (Zantac)  Risedronate (Actonel)  Simvastatin (Zocor)  Sodium Chloride  Vitamin D3 ==================================================================== For clinical consultation, please call [phone]. ====================================================================    UDS interpretation: Compliant          Medication Assessment Form: Reviewed. Patient indicates being compliant with therapy Treatment compliance: Compliant Risk Assessment Profile: Aberrant behavior: See prior evaluations. None observed or detected today Comorbid factors increasing risk of overdose: See prior notes. No additional risks detected today Opioid risk tool (ORT) (Total Score):   Personal History of Substance Abuse (SUD-Substance use disorder):  Alcohol:    Illegal Drugs:    Rx Drugs:    ORT Risk Level calculation:   Risk of substance use disorder (SUD): Low  ORT Scoring interpretation table:  Score <3 = Low Risk for SUD  Score between 4-7 = Moderate Risk for SUD  Score >8 = High Risk for Opioid Abuse   Risk Mitigation Strategies:  Patient Counseling: Covered Patient-Prescriber Agreement (PPA): Present and  active  Notification to other healthcare providers: Done  Pharmacologic Plan: No change in therapy, at this time.             Laboratory Chemistry  Inflammation Markers (CRP: Acute Phase) (ESR: Chronic Phase) Lab Results  Component Value Date   CRP 3 12/01/2017   ESRSEDRATE 5 12/01/2017                         Rheumatology Markers No results found.  Renal Function Markers Lab Results  Component Value Date   BUN 20 11/17/2017   CREATININE 0.86 11/17/2017   GFRAA >60 11/17/2017   GFRNONAA >60 11/17/2017                             Hepatic Function Markers Lab Results  Component Value Date   AST 27 11/17/2017   ALT 16 11/17/2017   ALBUMIN 4.2 11/17/2017   ALKPHOS 89 11/17/2017   LIPASE 19 11/17/2017                        Electrolytes Lab Results  Component Value Date   NA 136 11/17/2017   K 4.7 11/17/2017   CL 105 11/17/2017   CALCIUM 9.4 11/17/2017   MG 2.0 12/01/2017                        Neuropathy Markers No results found.  CNS Tests No results found.  Bone Pathology Markers No results found.  Coagulation Parameters Lab Results  Component Value Date   INR 1.00 10/15/2015   LABPROT 13.2 10/15/2015   APTT 26 10/15/2015   PLT 302 11/17/2017                        Cardiovascular Markers Lab Results  Component Value Date   TROPONINI <0.03 11/14/2017   HGB 13.5 11/17/2017   HCT 39.3 11/17/2017                         CA Markers No results found.  Note: Lab results reviewed.  Recent Diagnostic Imaging Review  Cervical Imaging: Cervical MR wo contrast:  Results for orders placed during the hospital encounter of 03/24/18  MR CERVICAL SPINE WO CONTRAST   Narrative CLINICAL DATA:  Chronic progressive cervicalgia.  Radiculitis.  EXAM: MRI CERVICAL SPINE WITHOUT CONTRAST  TECHNIQUE: Multiplanar, multisequence MR imaging of the cervical spine was performed. No intravenous contrast was administered.  COMPARISON:  Radiographs dated  03/08/2018 and cervical MRI dated 06/10/2012  FINDINGS: Alignment: 2 mm anterolisthesis at C5-6.  Vertebrae: No fracture, evidence of discitis, or bone lesion.  Cord: Normal signal and morphology.  Posterior Fossa, vertebral arteries, paraspinal tissues: Negative.  Disc levels:  C2-3: Negative.  C3-4: Negative.  C4-5: Tiny broad-based disc bulge with no neural impingement. No foraminal stenosis. Moderate left facet arthritis.  C5-6: 2 mm anterolisthesis of C5 on C6. Moderate right facet arthritis. No disc bulging or protrusion. No foraminal stenosis.  C6-7: There is a small focal soft disc protrusion into the origin of the right neural foramen best seen on image 16 of series 5 and series 6. This could affect the right C7 nerve.  C7-T1: Normal.  IMPRESSION: 1. Small focal soft disc protrusion into the right neural foramen at C6-7 which could affect the right C7 nerve. 2. Moderate right facet arthritis at C5-6 and moderate left facet arthritis at C3-4.   Electronically Signed   By: Lorriane Shire M.D.   On: 03/24/2018 14:50    Cervical DG Bending/F/E views:  Results for orders placed during the hospital encounter of 03/08/18  DG Cervical Spine With Flex & Extend   Narrative CLINICAL DATA:  Chronic neck and thoracic region back pain.  EXAM: CERVICAL SPINE COMPLETE WITH FLEXION AND EXTENSION VIEWS  COMPARISON:  MRI 06/10/2012  FINDINGS: 3 mm degenerative anterolisthesis at C5-6. With flexion, 3 mm of degenerative anterolisthesis occurs at C4-5 in the anterolisthesis at C5-6 increases to 5 mm. With extension, the C4-5 slippage reduces an the C5-6 slippage returns 2 3 mm. There is facet arthropathy at C4-5 and C5-6 responsible for this. There is osteophytic foraminal encroachment at these 2 levels as well, worse on the right than the left. Left-sided facet arthritis is more pronounced at C3-4 and C4-5 and right-sided facet arthritis is more pronounced at C4-5 and  C5-6. there is only mild disc space narrowing at C4-5.  IMPRESSION: Facet osteoarthritis, more pronounced on the left at C3-4 and C4-5 and on the right at C4-5 and C5-6. Anterolisthesis at C4-5 and C5-6 at worsens with flexion as described above. Foraminal narrowing most pronounced on the right at C4-5 and C5-6 due to osteophytic encroachment.   Electronically Signed   By: Nelson Chimes M.D.   On: 03/09/2018 07:38    Shoulder Imaging: Shoulder-L MR wo contrast:  Results for orders placed during the hospital encounter of 09/06/17  MR SHOULDER LEFT WO CONTRAST   Narrative CLINICAL DATA:  Nondisplaced fracture of the greater tuberosity left humerus. Persistent pain.  EXAM: MRI OF THE LEFT SHOULDER WITHOUT CONTRAST  TECHNIQUE: Multiplanar, multisequence MR imaging of the shoulder was performed. No intravenous contrast was administered.  COMPARISON:  05/24/2017  FINDINGS: Rotator cuff: Severe tendinosis of the supraspinatus tendon with a partial-thickness articular-surface tear. Moderate tendinosis of the infraspinatus tendon with a focal area of severe tendinosis. Teres minor tendon is intact. Subscapularis tendon is intact.  Muscles: No atrophy or fatty replacement of nor abnormal signal within, the muscles of the rotator cuff.  Biceps long head:  Intact.  Acromioclavicular Joint: Mild arthropathy of the acromioclavicular joint. Type I acromion. Small amount of subacromial/subdeltoid  bursal fluid.  Glenohumeral Joint: Small joint effusion.  No chondral defect.  Labrum: Grossly intact, but evaluation is limited by lack of intraarticular fluid.  Bones: Interval healing of the left greater tuberosity fracture with residual marrow edema which in part may be resulting from reactive subcortical marrow changes from severe tendinosis of the supraspinatus tendon. No aggressive osseous lesion.  Other: No fluid collection or hematoma.  IMPRESSION: 1. Severe tendinosis of  the supraspinatus tendon with a partial-thickness articular-surface tear. 2. Moderate tendinosis of the infraspinatus tendon with a focal area of severe tendinosis. 3. Interval healing of the left greater tuberosity fracture with residual marrow edema which in part may be resulting from reactive subcortical marrow changes from severe tendinosis of the supraspinatus tendon.   Electronically Signed   By: Kathreen Devoid   On: 09/06/2017 09:59    Thoracic Imaging: Thoracic DG 2-3 views:  Results for orders placed during the hospital encounter of 03/08/18  DG Thoracic Spine 2 View   Narrative CLINICAL DATA:  Chronic thoracic back pain.  EXAM: THORACIC SPINE 2 VIEWS  COMPARISON:  None.  FINDINGS: Minimal thoracolumbar curvature convex to the right. No evidence of regional fracture. No disc space narrowing. No focal lesion.  IMPRESSION: Negative except for minimal curvature.   Electronically Signed   By: Nelson Chimes M.D.   On: 03/09/2018 07:38    Complexity Note: Imaging results reviewed. Results shared with Brenda Clarke, using Layman's terms.                         Meds   Current Outpatient Medications:  .  Calcium Citrate 333 MG TABS, Take 2 capsules by mouth daily. , Disp: , Rfl:  .  Cholecalciferol (VITAMIN D3) 5000 units CAPS, Take 5,000 Units by mouth daily., Disp: , Rfl:  .  galantamine (RAZADYNE) 4 MG tablet, Take 4 mg by mouth every morning., Disp: , Rfl:  .  Ivacaftor ([name]) 150 MG TABS, Take 150 mg by mouth 2 (two) times daily., Disp: , Rfl:  .  metoprolol succinate (TOPROL-XL) 25 MG 24 hr tablet, Take 1 tablet by mouth 2 (two) times daily., Disp: , Rfl:  .  Multiple Vitamins-Minerals (WOMENS MULTIVITAMIN PO), Take 1 tablet by mouth daily., Disp: , Rfl:  .  Omega-3 Fatty Acids (FISH OIL) 1200 MG CAPS, Take 1 capsule by mouth daily., Disp: , Rfl:  .  sertraline (ZOLOFT) 100 MG tablet, Take 100 mg by mouth at bedtime., Disp: , Rfl:  .  simvastatin (ZOCOR) 40  MG tablet, Take 40 mg by mouth at bedtime. , Disp: , Rfl:  .  Sodium Chloride 10 % NEBU, Inhale 1 Dose into the lungs as needed., Disp: , Rfl:  .  tiZANidine (ZANAFLEX) 4 MG tablet, Take 1 tablet (4 mg total) by mouth every 8 (eight) hours as needed for muscle spasms., Disp: 90 tablet, Rfl: 0 .  zolpidem (AMBIEN CR) 12.5 MG CR tablet, Take 12.5 mg by mouth at bedtime as needed for sleep. , Disp: , Rfl:   ROS  Constitutional: Denies any fever or chills Gastrointestinal: No reported hemesis, hematochezia, vomiting, or acute GI distress Musculoskeletal: Denies any acute onset joint swelling, redness, loss of ROM, or weakness Neurological: No reported episodes of acute onset apraxia, aphasia, dysarthria, agnosia, amnesia, paralysis, loss of coordination, or loss of consciousness  Allergies  Brenda Clarke is allergic to tobramycin; codeine; galantamine; and hydrocodone.  PFSH  Drug: Brenda Clarke  reports no history  of drug use. Alcohol:  reports previous alcohol use. Tobacco:  reports that she has never smoked. She has never used smokeless tobacco. Medical:  has a past medical history of Cystic fibrosis ([place]), Depression, GERD (gastroesophageal reflux disease), History of hiatal hernia, Hypercholesteremia, and Hypertension. Surgical: Brenda Clarke  has a past surgical history that includes Colonoscopy; Bronchoscopy; Nasal sinus surgery; Tonsillectomy; Eye surgery; Cataract extraction w/ intraocular lens  implant, bilateral (Bilateral); and Shoulder arthroscopy with open rotator cuff repair (Left, 09/15/2017). Family: family history includes Alzheimer's disease in her mother; Stroke in her mother.  Constitutional Exam  General appearance: Well nourished, well developed, and well hydrated. In no apparent acute distress There were no vitals filed for this visit. BMI Assessment: Estimated body mass index is 19.47 kg/m as calculated from the following:   Height as of 03/08/18: '5\' 5"'  (7.672 m).   Weight as  of 03/08/18: 117 lb (53.1 kg).  BMI interpretation table: BMI level Category Range association with higher incidence of chronic pain  <18 kg/m2 Underweight   18.5-24.9 kg/m2 Ideal body weight   25-29.9 kg/m2 Overweight Increased incidence by 20%  30-34.9 kg/m2 Obese (Class I) Increased incidence by 68%  35-39.9 kg/m2 Severe obesity (Class II) Increased incidence by 136%  >40 kg/m2 Extreme obesity (Class III) Increased incidence by 254%   Patient's current BMI Ideal Body weight  There is no height or weight on file to calculate BMI. Patient weight not recorded   BMI Readings from Last 4 Encounters:  03/08/18 19.47 kg/m  12/14/17 20.80 kg/m  12/01/17 19.64 kg/m  11/17/17 19.97 kg/m   Wt Readings from Last 4 Encounters:  03/08/18 117 lb (53.1 kg)  12/14/17 125 lb (56.7 kg)  12/01/17 118 lb (53.5 kg)  11/17/17 120 lb (54.4 kg)  Psych/Mental status: Alert, oriented x 3 (person, place, & time)       Eyes: PERLA Respiratory: No evidence of acute respiratory distress  Cervical Spine Area Exam  Skin & Axial Inspection: No masses, redness, edema, swelling, or associated skin lesions Alignment: Symmetrical Functional ROM: Unrestricted ROM      Stability: No instability detected Muscle Tone/Strength: Functionally intact. No obvious neuro-muscular anomalies detected. Sensory (Neurological): Unimpaired Palpation: No palpable anomalies              Upper Extremity (UE) Exam    Side: Right upper extremity  Side: Left upper extremity  Skin & Extremity Inspection: Skin color, temperature, and hair growth are WNL. No peripheral edema or cyanosis. No masses, redness, swelling, asymmetry, or associated skin lesions. No contractures.  Skin & Extremity Inspection: Skin color, temperature, and hair growth are WNL. No peripheral edema or cyanosis. No masses, redness, swelling, asymmetry, or associated skin lesions. No contractures.  Functional ROM: Unrestricted ROM          Functional ROM:  Unrestricted ROM          Muscle Tone/Strength: Functionally intact. No obvious neuro-muscular anomalies detected.  Muscle Tone/Strength: Functionally intact. No obvious neuro-muscular anomalies detected.  Sensory (Neurological): Unimpaired          Sensory (Neurological): Unimpaired          Palpation: No palpable anomalies              Palpation: No palpable anomalies              Provocative Test(s):  Phalen's test: deferred Tinel's test: deferred Apley's scratch test (touch opposite shoulder):  Action 1 (Across chest): deferred Action 2 (Overhead): deferred Action  3 (LB reach): deferred   Provocative Test(s):  Phalen's test: deferred Tinel's test: deferred Apley's scratch test (touch opposite shoulder):  Action 1 (Across chest): deferred Action 2 (Overhead): deferred Action 3 (LB reach): deferred    Thoracic Spine Area Exam  Skin & Axial Inspection: No masses, redness, or swelling Alignment: Symmetrical Functional ROM: Unrestricted ROM Stability: No instability detected Muscle Tone/Strength: Functionally intact. No obvious neuro-muscular anomalies detected. Sensory (Neurological): Unimpaired Muscle strength & Tone: No palpable anomalies  Lumbar Spine Area Exam  Skin & Axial Inspection: No masses, redness, or swelling Alignment: Symmetrical Functional ROM: Unrestricted ROM       Stability: No instability detected Muscle Tone/Strength: Functionally intact. No obvious neuro-muscular anomalies detected. Sensory (Neurological): Unimpaired Palpation: No palpable anomalies       Provocative Tests: Hyperextension/rotation test: deferred today       Lumbar quadrant test (Kemp's test): deferred today       Lateral bending test: deferred today       Patrick's Maneuver: deferred today                   FABER* test: deferred today                   S-I anterior distraction/compression test: deferred today         S-I lateral compression test: deferred today         S-I  Thigh-thrust test: deferred today         S-I Gaenslen's test: deferred today         *(Flexion, ABduction and External Rotation)  Gait & Posture Assessment  Ambulation: Unassisted Gait: Relatively normal for age and body habitus Posture: WNL   Lower Extremity Exam    Side: Right lower extremity  Side: Left lower extremity  Stability: No instability observed          Stability: No instability observed          Skin & Extremity Inspection: Skin color, temperature, and hair growth are WNL. No peripheral edema or cyanosis. No masses, redness, swelling, asymmetry, or associated skin lesions. No contractures.  Skin & Extremity Inspection: Skin color, temperature, and hair growth are WNL. No peripheral edema or cyanosis. No masses, redness, swelling, asymmetry, or associated skin lesions. No contractures.  Functional ROM: Unrestricted ROM                  Functional ROM: Unrestricted ROM                  Muscle Tone/Strength: Functionally intact. No obvious neuro-muscular anomalies detected.  Muscle Tone/Strength: Functionally intact. No obvious neuro-muscular anomalies detected.  Sensory (Neurological): Unimpaired        Sensory (Neurological): Unimpaired        DTR: Patellar: deferred today Achilles: deferred today Plantar: deferred today  DTR: Patellar: deferred today Achilles: deferred today Plantar: deferred today  Palpation: No palpable anomalies  Palpation: No palpable anomalies   Assessment  Primary Diagnosis & Pertinent Problem List: There were no encounter diagnoses.  Status Diagnosis  Controlled Controlled Controlled No diagnosis found.  Problems updated and reviewed during this visit: No problems updated. Plan of Care  Pharmacotherapy (Medications Ordered): No orders of the defined types were placed in this encounter.  Medications administered today: Connye Burkitt. Rama had no medications administered during this visit.  Procedure Orders    No procedure(s) ordered today    Lab Orders  No laboratory test(s) ordered today   Imaging  Orders  No imaging studies ordered today   Referral Orders  No referral(s) requested today   Interventional management options: Planned, scheduled, and/or pending:   ***   Considering:   Diagnostic (Midline) cervical ESI  Diagnostic bilateral cervical facet blocks  Possible bilateral cervical facet RFA  Diagnostic left supraspinatus tendon sheath injection  Diagnostic left infraspinatus tendon sheath injection  Diagnostic left subacromial bursa injection  Diagnostic left subdeltoid bursa injection  Diagnostic left acromioclavicular joint injection  Diagnostic left suprascapular nerve block Possible left suprascapular RFA Diagnostic bilateral lumbar facet block  Possible bilateral lumbar facet RFA    Palliative PRN treatment(s):   None at this time   Provider-requested follow-up: No follow-ups on file.  Future Appointments  Date Time Provider [place]  03/27/2018  9:45 AM Milinda Pointer, MD [facility] None   Primary Care Physician: Rusty Aus, MD Location: [facility] Note by: Gaspar Cola, MD Date: 03/27/2018; Time: 12:22 AM

## 2018-03-27 ENCOUNTER — Ambulatory Visit: Payer: Medicare Other | Admitting: Pain Medicine

## 2018-03-28 NOTE — Unmapped (Signed)
[facility] Refill Coordination Note    Specialty Medication(s) to be Shipped:   CF/Pulmonary: -[name] (ivacaftor) 150mg  tablet    Other medication(s) to be shipped: n/a     Alyssa Willis, DOB: 1945-04-22  Phone: [phone] (home)       All above HIPAA information was verified with patient's family member.(husband)     Completed refill call assessment today to schedule patient's medication shipment from the [facility] [phone]).       Specialty medication(s) and dose(s) confirmed: Regimen is correct and unchanged.   Changes to medications: [name] reports no changes reported at this time.  Changes to insurance: No  Questions for the pharmacist: No    The patient will receive a drug information handout for each medication shipped and additional FDA Medication Guides as required.      DISEASE/MEDICATION-SPECIFIC INFORMATION        For CF patients: CF [employer] Active? No-not enrolled    ADHERENCE     Medication Adherence    Patient reported X missed doses in the last month:  0  Specialty Medication:  [name] 150mg  (OH: a week)   Patient is on additional specialty medications:  No  Patient is on more than two specialty medications:  No  Any gaps in refill history greater than 2 weeks in the last 3 months:  no  Informant:  significant other          Support network for adherence:  family member              Refill Coordination    Has the Patients' Contact Information Changed:  No  Is the Shipping Address Different:  No         MEDICARE PART B DOCUMENTATION     Not Applicable    SHIPPING     Shipping address confirmed in Epic.     Delivery Scheduled: Yes, Expected medication delivery date: 03/29/18 via UPS or courier.     Medication will be delivered via Next Day Courier to the home address in Epic WAM.    Julianne Rice   [facility] Specialty Pharmacist

## 2018-03-28 NOTE — Unmapped (Signed)
Eulas Post 's [name] shipment will be delayed due to Prior Authorization Required We have contacted the patient and communicated the delivery change to patient/caregiver We will call the patient to reschedule the delivery upon resolution. We have confirmed the delivery date as N/A .

## 2018-03-29 ENCOUNTER — Telehealth: Payer: Self-pay | Admitting: Pain Medicine

## 2018-03-29 ENCOUNTER — Ambulatory Visit: Payer: Medicare Other

## 2018-03-29 MED FILL — KALYDECO 150 MG TABLET: 28 days supply | Qty: 56 | Fill #1

## 2018-03-29 MED FILL — KALYDECO 150 MG TABLET: 28 days supply | Qty: 56 | Fill #1 | Status: AC

## 2018-03-29 NOTE — Telephone Encounter (Signed)
Pts husband left a voicemail and would like a nurse to call him back. He said they received a referral to neurology and wanted to know why and if they still needed to keep their appts with Dr. Delane Ginger or if they just needed to see a neurologist now? Please call them back.

## 2018-03-29 NOTE — Telephone Encounter (Signed)
pts husband called and asked if there was anything that Dr. Consuela Mimes could give pt to last until her appt. Her appt was originally on 1/20 I was able to get her in on the 13th because there was a cancellation. He was asking for something to last until her appt Monday.

## 2018-03-29 NOTE — Telephone Encounter (Signed)
Voicemail left returning patient call re; neurology appt and f/up with Dr Dossie Arbour.  I told them that they should base this on what neurology tells them and if they need further assistance/care from our office.

## 2018-03-30 NOTE — Telephone Encounter (Signed)
Spoke with Dr. Dossie Arbour, he agrees with [name] that patient will not be seen until her appointment on 04-03-18. [name] notified.

## 2018-03-30 NOTE — Telephone Encounter (Signed)
Would it be appropriate to offer Toradol/Norflex today?

## 2018-03-30 NOTE — Telephone Encounter (Signed)
What is going on with the patient ? Am I correct that this message was 22 hours ago and we are just responding? She had an apt on Monday and it was cancelled.

## 2018-04-02 NOTE — Progress Notes (Deleted)
Patient's Name: Brenda Clarke  MRN: [id number]  Referring Provider: Miller, Mark F, MD  DOB: 09/26/1945  PCP: Miller, Mark F, MD  DOS: 04/03/2018  Note by:  A , MD  Service setting: Ambulatory outpatient  Specialty: Interventional Pain Management  Location: [facility] (AMB) Pain Management Facility    Patient type: Established   Primary Reason(s) for Visit: Evaluation of chronic illnesses with exacerbation, or progression (Level of risk: moderate) CC: No chief complaint on file.  HPI  [name] is a 73 y.o. year old, female patient, who comes today for a follow-up evaluation. She has TIA (transient ischemic attack); Abnormal glucose; Age-related osteoporosis without current pathological fracture; Bronchiectasis (HCC); Chronic sinusitis; Cortical cataract of both eyes; Cystic fibrosis with pulmonary exacerbation (HCC); Cystic fibrosis (HCC); Essential hypertension; Fibrocystic breast disease; Gastro-esophageal reflux disease with esophagitis; Heartburn; Hyperlipidemia; Laceration of finger; Low serum vitamin D; Migraine without status migrainosus, not intractable; Monocular diplopia; DDD (degenerative disc disease), cervical; Chronic neck pain; Pain in finger; Prolapse of anterior vaginal wall; Recurrent major depressive disorder, in partial remission (HCC); Chronic low back pain (Tertiary Area of Pain) (Right); Chronic tendinitis of rotator cuff (Left); S/P cataract extraction and insertion of intraocular lens, left; Screen for colon cancer; Spondylosis of cervical region without myelopathy or radiculopathy; Tubular adenoma; Uterovaginal prolapse; Chronic shoulder pain (Primary Area of Pain) (Left); Chronic upper extremity pain (Secondary Area of Pain) (Left); Chronic pain syndrome; Long term current use of opiate analgesic; Pharmacologic therapy; Disorder of skeletal system; Problems influencing health status; Cervicalgia; Traumatic tear of rotator cuff, sequela (Left); Supraspinatus tendonitis  (Left); Infraspinatus tendonitis (Left); Osteoarthritis of AC (acromioclavicular) joint (Left); Subacromial bursitis (Left); Subdeltoid bursitis (Left); Effusion of glenohumeral joint (Left); History of traumatic closed nondisplaced fracture of greater tuberosity of humerus (Left); Chronic upper back pain; Pain of rhomboid muscle; Abnormal x-ray of cervical spine; Cervical Grade 1 Anterolisthesis of C4/C5 & C5/C6.; Cervical facet arthropathy (Bilateral); Cervical facet syndrome (Bilateral); 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 [name] was last seen on 03/29/2018. Her primarily concern today is the No chief complaint on file.  Pain Assessment: Location:     Radiating:   Onset:   Duration:   Quality:   Severity:  /10 (subjective, self-reported pain score)  Note: Reported level is compatible with observation.                         When using our objective Pain Scale, levels between 6 and 10/10 are said to belong in an emergency room, as it progressively worsens from a 6/10, described as severely limiting, requiring emergency care not usually available at an outpatient pain management facility. At a 6/10 level, communication becomes difficult and requires great effort. Assistance to reach the emergency department may be required. Facial flushing and profuse sweating along with potentially dangerous increases in heart rate and blood pressure will be evident. Effect on ADL:   Timing:   Modifying factors:   BP:    HR:    Further details on both, my assessment(s), as well as the proposed treatment plan, please see below.  Laboratory Chemistry  Inflammation Markers (CRP: Acute Phase) (ESR: Chronic Phase) Lab Results  Component Value Date   CRP 3 12/01/2017   ESRSEDRATE 5 12/01/2017                           Rheumatology Markers No results found.  Renal  Function Markers Lab Results  Component Value Date   BUN 20 11/17/2017   CREATININE 0.86 11/17/2017   GFRAA >60 11/17/2017   GFRNONAA >60 11/17/2017                             Hepatic Function Markers Lab Results  Component Value Date   AST 27 11/17/2017   ALT 16 11/17/2017   ALBUMIN 4.2 11/17/2017   ALKPHOS 89 11/17/2017   LIPASE 19 11/17/2017                        Electrolytes Lab Results  Component Value Date   NA 136 11/17/2017   K 4.7 11/17/2017   CL 105 11/17/2017   CALCIUM 9.4 11/17/2017   MG 2.0 12/01/2017                        Neuropathy Markers No results found.  CNS Tests No results found.  Bone Pathology Markers No results found.  Coagulation Parameters Lab Results  Component Value Date   INR 1.00 10/15/2015   LABPROT 13.2 10/15/2015   APTT 26 10/15/2015   PLT 302 11/17/2017                        Cardiovascular Markers Lab Results  Component Value Date   TROPONINI <0.03 11/14/2017   HGB 13.5 11/17/2017   HCT 39.3 11/17/2017                         CA Markers No results found.  Note: Lab results reviewed.  Imaging Review  Cervical Imaging: Cervical MR wo contrast:  Results for orders placed during the hospital encounter of 03/24/18  MR CERVICAL SPINE WO CONTRAST   Narrative CLINICAL DATA:  Chronic progressive cervicalgia.  Radiculitis.  EXAM: MRI CERVICAL SPINE WITHOUT CONTRAST  TECHNIQUE: Multiplanar, multisequence MR imaging of the cervical spine was performed. No intravenous contrast was administered.  COMPARISON:  Radiographs dated 03/08/2018 and cervical MRI dated 06/10/2012  FINDINGS: Alignment: 2 mm anterolisthesis at C5-6.  Vertebrae: No fracture, evidence of discitis, or bone lesion.  Cord: Normal signal and morphology.  Posterior Fossa, vertebral arteries, paraspinal tissues: Negative.  Disc levels:  C2-3: Negative.  C3-4: Negative.  C4-5: Tiny broad-based disc bulge with no neural impingement.  No foraminal stenosis. Moderate left facet arthritis.  C5-6: 2 mm anterolisthesis of C5 on C6. Moderate right facet arthritis. No disc bulging or protrusion. No foraminal stenosis.  C6-7: There is a small focal soft disc protrusion into the origin of the right neural foramen best seen on image 16 of series 5 and series 6. This could affect the right C7 nerve.  C7-T1: Normal.  IMPRESSION: 1. Small focal soft disc protrusion into the right neural foramen at C6-7 which could affect the right C7 nerve. 2. Moderate right facet arthritis at C5-6 and moderate left facet arthritis at C3-4.   Electronically Signed   By: Lorriane Shire M.D.   On: 03/24/2018 14:50    Cervical DG Bending/F/E views:  Results for orders placed during the hospital encounter of 03/08/18  DG Cervical Spine With Flex & Extend   Narrative CLINICAL DATA:  Chronic neck and thoracic region back pain.  EXAM: CERVICAL SPINE COMPLETE WITH FLEXION AND EXTENSION VIEWS  COMPARISON:  MRI  06/10/2012  FINDINGS: 3 mm degenerative anterolisthesis at C5-6. With flexion, 3 mm of degenerative anterolisthesis occurs at C4-5 in the anterolisthesis at C5-6 increases to 5 mm. With extension, the C4-5 slippage reduces an the C5-6 slippage returns 2 3 mm. There is facet arthropathy at C4-5 and C5-6 responsible for this. There is osteophytic foraminal encroachment at these 2 levels as well, worse on the right than the left. Left-sided facet arthritis is more pronounced at C3-4 and C4-5 and right-sided facet arthritis is more pronounced at C4-5 and C5-6. there is only mild disc space narrowing at C4-5.  IMPRESSION: Facet osteoarthritis, more pronounced on the left at C3-4 and C4-5 and on the right at C4-5 and C5-6. Anterolisthesis at C4-5 and C5-6 at worsens with flexion as described above. Foraminal narrowing most pronounced on the right at C4-5 and C5-6 due to osteophytic encroachment.   Electronically Signed   By: Mark   Shogry M.D.   On: 03/09/2018 07:38    Shoulder Imaging: Shoulder-L MR wo contrast:  Results for orders placed during the hospital encounter of 09/06/17  MR SHOULDER LEFT WO CONTRAST   Narrative CLINICAL DATA:  Nondisplaced fracture of the greater tuberosity left humerus. Persistent pain.  EXAM: MRI OF THE LEFT SHOULDER WITHOUT CONTRAST  TECHNIQUE: Multiplanar, multisequence MR imaging of the shoulder was performed. No intravenous contrast was administered.  COMPARISON:  05/24/2017  FINDINGS: Rotator cuff: Severe tendinosis of the supraspinatus tendon with a partial-thickness articular-surface tear. Moderate tendinosis of the infraspinatus tendon with a focal area of severe tendinosis. Teres minor tendon is intact. Subscapularis tendon is intact.  Muscles: No atrophy or fatty replacement of nor abnormal signal within, the muscles of the rotator cuff.  Biceps long head:  Intact.  Acromioclavicular Joint: Mild arthropathy of the acromioclavicular joint. Type I acromion. Small amount of subacromial/subdeltoid bursal fluid.  Glenohumeral Joint: Small joint effusion.  No chondral defect.  Labrum: Grossly intact, but evaluation is limited by lack of intraarticular fluid.  Bones: Interval healing of the left greater tuberosity fracture with residual marrow edema which in part may be resulting from reactive subcortical marrow changes from severe tendinosis of the supraspinatus tendon. No aggressive osseous lesion.  Other: No fluid collection or hematoma.  IMPRESSION: 1. Severe tendinosis of the supraspinatus tendon with a partial-thickness articular-surface tear. 2. Moderate tendinosis of the infraspinatus tendon with a focal area of severe tendinosis. 3. Interval healing of the left greater tuberosity fracture with residual marrow edema which in part may be resulting from reactive subcortical marrow changes from severe tendinosis of the supraspinatus  tendon.   Electronically Signed   By: Hetal  Patel   On: 09/06/2017 09:59    Thoracic Imaging: Thoracic DG 2-3 views:  Results for orders placed during the hospital encounter of 03/08/18  DG Thoracic Spine 2 View   Narrative CLINICAL DATA:  Chronic thoracic back pain.  EXAM: THORACIC SPINE 2 VIEWS  COMPARISON:  None.  FINDINGS: Minimal thoracolumbar curvature convex to the right. No evidence of regional fracture. No disc space narrowing. No focal lesion.  IMPRESSION: Negative except for minimal curvature.   Electronically Signed   By: Mark  Shogry M.D.   On: 03/09/2018 07:38    Complexity Note: Imaging results reviewed. Results shared with Ms. Strange, using Layman's terms.                         Meds   Current Outpatient Medications:  .  Calcium Citrate 333 MG   TABS, Take 2 capsules by mouth daily. , Disp: , Rfl:  .  Cholecalciferol (VITAMIN D3) 5000 units CAPS, Take 5,000 Units by mouth daily., Disp: , Rfl:  .  galantamine (RAZADYNE) 4 MG tablet, Take 4 mg by mouth every morning., Disp: , Rfl:  .  Ivacaftor ([name]) 150 MG TABS, Take 150 mg by mouth 2 (two) times daily., Disp: , Rfl:  .  metoprolol succinate (TOPROL-XL) 25 MG 24 hr tablet, Take 1 tablet by mouth 2 (two) times daily., Disp: , Rfl:  .  Multiple Vitamins-Minerals (WOMENS MULTIVITAMIN PO), Take 1 tablet by mouth daily., Disp: , Rfl:  .  Omega-3 Fatty Acids (FISH OIL) 1200 MG CAPS, Take 1 capsule by mouth daily., Disp: , Rfl:  .  sertraline (ZOLOFT) 100 MG tablet, Take 100 mg by mouth at bedtime., Disp: , Rfl:  .  simvastatin (ZOCOR) 40 MG tablet, Take 40 mg by mouth at bedtime. , Disp: , Rfl:  .  Sodium Chloride 10 % NEBU, Inhale 1 Dose into the lungs as needed., Disp: , Rfl:  .  tiZANidine (ZANAFLEX) 4 MG tablet, Take 1 tablet (4 mg total) by mouth every 8 (eight) hours as needed for muscle spasms., Disp: 90 tablet, Rfl: 0 .  zolpidem (AMBIEN CR) 12.5 MG CR tablet, Take 12.5 mg by mouth at bedtime as  needed for sleep. , Disp: , Rfl:   ROS  Constitutional: Denies any fever or chills Gastrointestinal: No reported hemesis, hematochezia, vomiting, or acute GI distress Musculoskeletal: Denies any acute onset joint swelling, redness, loss of ROM, or weakness Neurological: No reported episodes of acute onset apraxia, aphasia, dysarthria, agnosia, amnesia, paralysis, loss of coordination, or loss of consciousness  Allergies  Ms. Hecht is allergic to tobramycin; codeine; galantamine; and hydrocodone.  PFSH  Drug: Ms. Niehaus  reports no history of drug use. Alcohol:  reports previous alcohol use. Tobacco:  reports that she has never smoked. She has never used smokeless tobacco. Medical:  has a past medical history of Cystic fibrosis ([place]), Depression, GERD (gastroesophageal reflux disease), History of hiatal hernia, Hypercholesteremia, and Hypertension. Surgical: Ms. Conry  has a past surgical history that includes Colonoscopy; Bronchoscopy; Nasal sinus surgery; Tonsillectomy; Eye surgery; Cataract extraction w/ intraocular lens  implant, bilateral (Bilateral); and Shoulder arthroscopy with open rotator cuff repair (Left, 09/15/2017). Family: family history includes Alzheimer's disease in her mother; Stroke in her mother.  Constitutional Exam  General appearance: Well nourished, well developed, and well hydrated. In no apparent acute distress There were no vitals filed for this visit. BMI Assessment: Estimated body mass index is 19.47 kg/m as calculated from the following:   Height as of 03/08/18: 5' 5" (1.651 m).   Weight as of 03/08/18: 117 lb (53.1 kg).  BMI interpretation table: BMI level Category Range association with higher incidence of chronic pain  <18 kg/m2 Underweight   18.5-24.9 kg/m2 Ideal body weight   25-29.9 kg/m2 Overweight Increased incidence by 20%  30-34.9 kg/m2 Obese (Class I) Increased incidence by 68%  35-39.9 kg/m2 Severe obesity (Class II) Increased incidence by  136%  >40 kg/m2 Extreme obesity (Class III) Increased incidence by 254%   Patient's current BMI Ideal Body weight  There is no height or weight on file to calculate BMI. Patient weight not recorded   BMI Readings from Last 4 Encounters:  03/08/18 19.47 kg/m  12/14/17 20.80 kg/m  12/01/17 19.64 kg/m  11/17/17 19.97 kg/m   Wt Readings from Last 4 Encounters:  03/08/18 117 lb (53.1  kg)  12/14/17 125 lb (56.7 kg)  12/01/17 118 lb (53.5 kg)  11/17/17 120 lb (54.4 kg)  Psych/Mental status: Alert, oriented x 3 (person, place, & time)       Eyes: PERLA Respiratory: No evidence of acute respiratory distress  Cervical Spine Area Exam  Skin & Axial Inspection: No masses, redness, edema, swelling, or associated skin lesions Alignment: Symmetrical Functional ROM: Unrestricted ROM      Stability: No instability detected Muscle Tone/Strength: Functionally intact. No obvious neuro-muscular anomalies detected. Sensory (Neurological): Unimpaired Palpation: No palpable anomalies              Upper Extremity (UE) Exam    Side: Right upper extremity  Side: Left upper extremity  Skin & Extremity Inspection: Skin color, temperature, and hair growth are WNL. No peripheral edema or cyanosis. No masses, redness, swelling, asymmetry, or associated skin lesions. No contractures.  Skin & Extremity Inspection: Skin color, temperature, and hair growth are WNL. No peripheral edema or cyanosis. No masses, redness, swelling, asymmetry, or associated skin lesions. No contractures.  Functional ROM: Unrestricted ROM          Functional ROM: Unrestricted ROM          Muscle Tone/Strength: Functionally intact. No obvious neuro-muscular anomalies detected.  Muscle Tone/Strength: Functionally intact. No obvious neuro-muscular anomalies detected.  Sensory (Neurological): Unimpaired          Sensory (Neurological): Unimpaired          Palpation: No palpable anomalies              Palpation: No palpable anomalies               Provocative Test(s):  Phalen's test: deferred Tinel's test: deferred Apley's scratch test (touch opposite shoulder):  Action 1 (Across chest): deferred Action 2 (Overhead): deferred Action 3 (LB reach): deferred   Provocative Test(s):  Phalen's test: deferred Tinel's test: deferred Apley's scratch test (touch opposite shoulder):  Action 1 (Across chest): deferred Action 2 (Overhead): deferred Action 3 (LB reach): deferred    Thoracic Spine Area Exam  Skin & Axial Inspection: No masses, redness, or swelling Alignment: Symmetrical Functional ROM: Unrestricted ROM Stability: No instability detected Muscle Tone/Strength: Functionally intact. No obvious neuro-muscular anomalies detected. Sensory (Neurological): Unimpaired Muscle strength & Tone: No palpable anomalies  Lumbar Spine Area Exam  Skin & Axial Inspection: No masses, redness, or swelling Alignment: Symmetrical Functional ROM: Unrestricted ROM       Stability: No instability detected Muscle Tone/Strength: Functionally intact. No obvious neuro-muscular anomalies detected. Sensory (Neurological): Unimpaired Palpation: No palpable anomalies       Provocative Tests: Hyperextension/rotation test: deferred today       Lumbar quadrant test (Kemp's test): deferred today       Lateral bending test: deferred today       Patrick's Maneuver: deferred today                   FABER* test: deferred today                   S-I anterior distraction/compression test: deferred today         S-I lateral compression test: deferred today         S-I Thigh-thrust test: deferred today         S-I Gaenslen's test: deferred today         *(Flexion, ABduction and External Rotation)  Gait & Posture Assessment  Ambulation: Unassisted Gait: Relatively normal for   age and body habitus Posture: WNL   Lower Extremity Exam    Side: Right lower extremity  Side: Left lower extremity  Stability: No instability observed          Stability: No  instability observed          Skin & Extremity Inspection: Skin color, temperature, and hair growth are WNL. No peripheral edema or cyanosis. No masses, redness, swelling, asymmetry, or associated skin lesions. No contractures.  Skin & Extremity Inspection: Skin color, temperature, and hair growth are WNL. No peripheral edema or cyanosis. No masses, redness, swelling, asymmetry, or associated skin lesions. No contractures.  Functional ROM: Unrestricted ROM                  Functional ROM: Unrestricted ROM                  Muscle Tone/Strength: Functionally intact. No obvious neuro-muscular anomalies detected.  Muscle Tone/Strength: Functionally intact. No obvious neuro-muscular anomalies detected.  Sensory (Neurological): Unimpaired        Sensory (Neurological): Unimpaired        DTR: Patellar: deferred today Achilles: deferred today Plantar: deferred today  DTR: Patellar: deferred today Achilles: deferred today Plantar: deferred today  Palpation: No palpable anomalies  Palpation: No palpable anomalies   Assessment  Primary Diagnosis & Pertinent Problem List: The primary encounter diagnosis was Chronic shoulder pain (Primary Area of Pain) (Left). 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  Status Diagnosis  Controlled Controlled Controlled 1. Chronic shoulder pain (Primary Area of Pain) (Left)   2. Chronic upper extremity pain (Secondary Area of Pain) (Left)   3. Chronic low back pain (Tertiary  Area of Pain) (Right)   4. Chronic tendinitis of rotator cuff (Left)   5. Effusion of glenohumeral joint (Left)   6. Osteoarthritis of AC (acromioclavicular) joint (Left)   7. Subacromial bursitis (Left)   8. Subdeltoid bursitis (Left)   9. Supraspinatus tendonitis (Left)   10. Traumatic tear of rotator cuff, sequela (Left)   11. Cervical Grade 1 Anterolisthesis of C4/C5 & C5/C6.   12. 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 Plan of Care  Pharmacotherapy (Medications Ordered): No orders of the defined types were placed in this encounter.  Medications administered today: Eisha M. Branagan had no medications administered during this visit.  Procedure Orders    No procedure(s) ordered today   Lab Orders  No laboratory test(s) ordered today   Imaging Orders  No imaging studies ordered today   Referral Orders  No referral(s) requested today   Interventional management options: Planned, scheduled, and/or pending:   ***   Considering:   Diagnostic (Midline) cervical ESI  Diagnostic bilateral cervical facet blocks  Possible bilateral cervical facet RFA  Diagnostic left supraspinatus tendon sheath injection  Diagnostic left infraspinatus tendon sheath injection  Diagnostic left subacromial bursa injection  Diagnostic left subdeltoid bursa injection  Diagnostic left acromioclavicular joint injection  Diagnostic left suprascapular nerve block Possible left suprascapular RFA Diagnostic bilateral lumbar facet   block  Possible bilateral lumbar facet RFA    Palliative PRN treatment(s):   None at this time   Provider-requested follow-up: No follow-ups on file.  Future Appointments  Date Time Provider Department Center  04/03/2018  9:15 AM , , MD [facility] None   Primary Care Physician: Miller, Mark F, MD Location: [facility] Note by:   A , MD Date: 04/03/2018; Time: 5:44 AM 

## 2018-04-03 ENCOUNTER — Ambulatory Visit: Payer: Medicare Other | Admitting: Pain Medicine

## 2018-04-10 ENCOUNTER — Ambulatory Visit: Payer: Medicare Other | Admitting: Pain Medicine

## 2018-04-19 ENCOUNTER — Ambulatory Visit
Admit: 2018-04-19 | Discharge: 2018-04-20 | Payer: MEDICARE | Attending: Obstetrics & Gynecology | Primary: Obstetrics & Gynecology

## 2018-04-19 DIAGNOSIS — N952 Postmenopausal atrophic vaginitis: Secondary | ICD-10-CM

## 2018-04-19 DIAGNOSIS — Z4689 Encounter for fitting and adjustment of other specified devices: Principal | ICD-10-CM

## 2018-04-19 NOTE — Unmapped (Signed)
[facility]  Evaluation of Established Patient - Pessary Check    Referring Provider: Self, Referred  PCP: Danella Penton, MD  Date of Service: 04/19/2018    Chief Complaint: Spotting and Pessary Check    HPI: Alyssa Willis is a 73 y.o. female for a pessary check.  She reports spotting with removal of pessary.  Not using estrogen, notes this occurs intermittently and has been unchanged since starting to use the pessary several years ago.  No other complaints.    Urogynecologic History and Treatment Summary:  3 ring with support pessary for prolapse.       Physical Exam:  BP 122/67  - Pulse 90  - Resp 20  - Wt 53.1 kg (117 lb)  - BMI 20.08 kg/m??      The pessary was removed without difficulty.  Speculum exam: vaginal mucosa had superficial abrasions.  The pessary was cleaned and given to patient.    PROCEDURE - Pessary Fitting   The patient was fitted with:2 ring with support which fit comfortably without pain/undue tension on the vaginal tissues and stayed in proper position with valsalva.    Assessment and Plan:  Alyssa Willis is a 73 y.o. with:   1. Fitting and adjustment of pessary    2. Vaginal atrophy      Doing well with pessary - does not want surgery.  Downsized to #2 RWS today to reduce pressure on vaginal tissues and hopefully reduce tearing with removal  Start vaginal estrogen cream to aid in healing as well    Return if symptoms worsen or fail to improve.    Advanced Care Planning  - Pt's husband is her HCDM, information in the chart    New Prescriptions    ESTRADIOL (ESTRACE) 0.01 % (0.1 MG/GRAM) VAGINAL CREAM    Insert 0.5 g into the vagina Two (2) times a week.     No orders of the defined types were placed in this encounter.     Time:  The total time for the visit was 15 minutes, of which greater than 50% was spent in face to face counseling with the patient.  Alyssa Willis A. Jenne Campus, MD

## 2018-04-19 NOTE — Unmapped (Signed)
PLAN TODAY:  - Please call our office if your pessary comes out and you cannot replace it, or if you are having pain, heavy bleeding or other concerns.  - Start vaginal estrogen 1-2 times weekly for treatment of atrophy to help rejuvenate the vaginal tissues - call if you want compounding pharmacy preparation (cost is ~$80/tube)    QUESTIONS?  If you have further questions or concerns, please call our Urogynecology Triage Nurse at [phone]. Please leave a message and someone will contact you within 1-2 business days.  For urgent issues after 4:30PM weekdays and on weekends, call the [facility] operator at [phone] and have them page the [facility] Resident on call.  Please reserve this option for important issues only. For emergencies go to your nearest Emergency Room or call 911.

## 2018-04-20 MED ORDER — ESTRADIOL 0.01% (0.1 MG/GRAM) VAGINAL CREAM
VAGINAL | 4 refills | 0 days | Status: CP
Start: 2018-04-20 — End: ?

## 2018-04-20 NOTE — Progress Notes (Signed)
Patient's Name: Brenda Clarke  MRN: [id number]  Referring Provider: Rusty Aus, MD  DOB: March 15, 1946  PCP: Rusty Aus, MD  DOS: 04/24/2018  Note by: Gaspar Cola, MD  Service setting: Ambulatory outpatient  Specialty: Interventional Pain Management  Location: [facility] (AMB) Pain Management Facility    Patient type: Established   Primary Reason(s) for Visit: Evaluation of chronic illnesses with exacerbation, or progression (Level of risk: moderate) CC: Back Pain (mid and low)  HPI  Brenda Clarke is a 73 y.o. year old, female patient, who comes today for a follow-up evaluation. 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[place]); Cystic fibrosis ([place]); Essential hypertension; Fibrocystic breast disease; Gastro-esophageal reflux disease with esophagitis; Heartburn; Hyperlipidemia; Laceration of finger; Low serum vitamin D; Migraine without status migrainosus, not intractable; Monocular diplopia; DDD (degenerative disc disease), cervical; Chronic neck pain; Pain in finger; Prolapse of anterior vaginal wall; Recurrent major depressive disorder, in partial remission ([place]); Chronic low back pain [facility]) (Right); Chronic tendinitis of rotator cuff (Left); S/P cataract extraction and insertion of intraocular lens, left; Screen for colon cancer; Spondylosis of cervical region without myelopathy or radiculopathy; Tubular adenoma; Uterovaginal prolapse; Chronic shoulder pain (Primary Area of Pain) (Left); Chronic upper extremity pain (Secondary Area of Pain) (Left); Chronic pain syndrome; Long term current use of opiate analgesic; Pharmacologic therapy; Disorder of skeletal system; Problems influencing health status; Cervicalgia; Traumatic tear of rotator cuff, sequela (Left); Supraspinatus tendonitis  (Left); Infraspinatus tendonitis (Left); Osteoarthritis of AC (acromioclavicular) joint (Left); Subacromial bursitis (Left); Subdeltoid bursitis (Left); Effusion of glenohumeral joint (Left); History of traumatic closed nondisplaced fracture of greater tuberosity of humerus (Left); Chronic upper back pain (Bilateral); Pain of rhomboid muscle; Abnormal x-ray of cervical spine; Cervical Grade 1 Anterolisthesis of C4/C5 & C5/C6.; Cervical facet arthropathy (Bilateral); Cervical facet syndrome (Bilateral); 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 Brenda Clarke was last seen on 03/29/2018. Her primarily concern today is the Back Pain (mid and low)  Pain Assessment: Location: Lower, Mid Back Radiating: right leg tingling at times Onset: More than a month ago Duration: Chronic pain Quality: Sharp Severity: 7 /10 (subjective, self-reported pain score)  Note: Reported level is inconsistent with clinical observations. Clinically the patient looks like a 4/10 A 4/10 is viewed as "Moderately Severe" and described as impossible to ignore for more than a few minutes. With effort, patients may still be able to manage work or participate in some social activities. Very difficult to concentrate. Signs of autonomic nervous system discharge are evident: dilated pupils (mydriasis); mild sweating (diaphoresis); sleep interference. Heart rate becomes elevated (>115 bpm). Diastolic blood pressure (lower number) rises above 100 mmHg. Patients find relief in laying down and not moving. Information on the proper use of the pain scale provided to the patient today. When using our objective Pain Scale, levels between 6 and 10/10 are  said to belong in an emergency room, as it progressively worsens from a 6/10, described as severely limiting, requiring emergency care not usually available at an outpatient pain management facility. At a 6/10 level, communication becomes difficult and requires great effort. Assistance to reach the emergency department may be required. Facial  flushing and profuse sweating along with potentially dangerous increases in heart rate and blood pressure will be evident. Timing: Constant Modifying factors: hot water bottle BP: 126/71  HR: (!) 105  The patient returns to the clinic today after having had a steroid taper treatment and an MRI of the cervical spine.  She comes in today for evaluation on the results of the therapy and to review the MRI to see what else can be offered.  Further details on both, my assessment(s), as well as the proposed treatment plan, please see below.  Laboratory Chemistry  Inflammation Markers (CRP: Acute Phase) (ESR: Chronic Phase) Lab Results  Component Value Date   CRP 3 12/01/2017   ESRSEDRATE 5 12/01/2017                         Rheumatology Markers No results found.  Renal Function Markers Lab Results  Component Value Date   BUN 20 11/17/2017   CREATININE 0.86 11/17/2017   GFRAA >60 11/17/2017   GFRNONAA >60 11/17/2017                             Hepatic Function Markers Lab Results  Component Value Date   AST 27 11/17/2017   ALT 16 11/17/2017   ALBUMIN 4.2 11/17/2017   ALKPHOS 89 11/17/2017   LIPASE 19 11/17/2017                        Electrolytes Lab Results  Component Value Date   NA 136 11/17/2017   K 4.7 11/17/2017   CL 105 11/17/2017   CALCIUM 9.4 11/17/2017   MG 2.0 12/01/2017                        Neuropathy Markers No results found.  CNS Tests No results found.  Bone Pathology Markers No results found.  Coagulation Parameters Lab Results  Component Value Date   INR 1.00 10/15/2015   LABPROT 13.2 10/15/2015   APTT  26 10/15/2015   PLT 302 11/17/2017                        Cardiovascular Markers Lab Results  Component Value Date   TROPONINI <0.03 11/14/2017   HGB 13.5 11/17/2017   HCT 39.3 11/17/2017                         CA Markers No results found.  Note: Lab results reviewed.  Imaging Review  Cervical Imaging: Cervical MR wo contrast:  Results for orders placed during the hospital encounter of 03/24/18  MR CERVICAL SPINE WO CONTRAST   Narrative CLINICAL DATA:  Chronic progressive cervicalgia.  Radiculitis.  EXAM: MRI CERVICAL SPINE WITHOUT CONTRAST  TECHNIQUE: Multiplanar, multisequence MR imaging of the cervical spine was performed. No intravenous contrast was administered.  COMPARISON:  Radiographs dated 03/08/2018 and cervical MRI dated 06/10/2012  FINDINGS: Alignment: 2 mm anterolisthesis at C5-6.  Vertebrae: No fracture, evidence of discitis, or bone lesion.  Cord: Normal signal and morphology.  Posterior Fossa, vertebral arteries, paraspinal tissues: Negative.  Disc levels:  C2-3: Negative.  C3-4: Negative.  C4-5: Tiny broad-based disc bulge with no neural impingement. No foraminal stenosis. Moderate left facet arthritis.  C5-6: 2 mm anterolisthesis of C5 on C6. Moderate right facet arthritis. No disc bulging or protrusion. No  foraminal stenosis.  C6-7: There is a small focal soft disc protrusion into the origin of the right neural foramen best seen on image 16 of series 5 and series 6. This could affect the right C7 nerve.  C7-T1: Normal.  IMPRESSION: 1. Small focal soft disc protrusion into the right neural foramen at C6-7 which could affect the right C7 nerve. 2. Moderate right facet arthritis at C5-6 and moderate left facet arthritis at C3-4.   Electronically Signed   By: Lorriane Shire M.D.   On: 03/24/2018 14:50    Cervical DG Bending/F/E views:  Results for orders placed during the hospital encounter of 03/08/18  DG Cervical Spine With  Flex & Extend   Narrative CLINICAL DATA:  Chronic neck and thoracic region back pain.  EXAM: CERVICAL SPINE COMPLETE WITH FLEXION AND EXTENSION VIEWS  COMPARISON:  MRI 06/10/2012  FINDINGS: 3 mm degenerative anterolisthesis at C5-6. With flexion, 3 mm of degenerative anterolisthesis occurs at C4-5 in the anterolisthesis at C5-6 increases to 5 mm. With extension, the C4-5 slippage reduces an the C5-6 slippage returns 2 3 mm. There is facet arthropathy at C4-5 and C5-6 responsible for this. There is osteophytic foraminal encroachment at these 2 levels as well, worse on the right than the left. Left-sided facet arthritis is more pronounced at C3-4 and C4-5 and right-sided facet arthritis is more pronounced at C4-5 and C5-6. there is only mild disc space narrowing at C4-5.  IMPRESSION: Facet osteoarthritis, more pronounced on the left at C3-4 and C4-5 and on the right at C4-5 and C5-6. Anterolisthesis at C4-5 and C5-6 at worsens with flexion as described above. Foraminal narrowing most pronounced on the right at C4-5 and C5-6 due to osteophytic encroachment.   Electronically Signed   By: Nelson Chimes M.D.   On: 03/09/2018 07:38    Shoulder Imaging: Shoulder-L MR wo contrast:  Results for orders placed during the hospital encounter of 09/06/17  MR SHOULDER LEFT WO CONTRAST   Narrative CLINICAL DATA:  Nondisplaced fracture of the greater tuberosity left humerus. Persistent pain.  EXAM: MRI OF THE LEFT SHOULDER WITHOUT CONTRAST  TECHNIQUE: Multiplanar, multisequence MR imaging of the shoulder was performed. No intravenous contrast was administered.  COMPARISON:  05/24/2017  FINDINGS: Rotator cuff: Severe tendinosis of the supraspinatus tendon with a partial-thickness articular-surface tear. Moderate tendinosis of the infraspinatus tendon with a focal area of severe tendinosis. Teres minor tendon is intact. Subscapularis tendon is intact.  Muscles: No atrophy or fatty  replacement of nor abnormal signal within, the muscles of the rotator cuff.  Biceps long head:  Intact.  Acromioclavicular Joint: Mild arthropathy of the acromioclavicular joint. Type I acromion. Small amount of subacromial/subdeltoid bursal fluid.  Glenohumeral Joint: Small joint effusion.  No chondral defect.  Labrum: Grossly intact, but evaluation is limited by lack of intraarticular fluid.  Bones: Interval healing of the left greater tuberosity fracture with residual marrow edema which in part may be resulting from reactive subcortical marrow changes from severe tendinosis of the supraspinatus tendon. No aggressive osseous lesion.  Other: No fluid collection or hematoma.  IMPRESSION: 1. Severe tendinosis of the supraspinatus tendon with a partial-thickness articular-surface tear. 2. Moderate tendinosis of the infraspinatus tendon with a focal area of severe tendinosis. 3. Interval healing of the left greater tuberosity fracture with residual marrow edema which in part may be resulting from reactive subcortical marrow changes from severe tendinosis of the supraspinatus tendon.   Electronically Signed   By: Kathreen Devoid  On: 09/06/2017 09:59    Thoracic Imaging: Thoracic DG 2-3 views:  Results for orders placed during the hospital encounter of 03/08/18  DG Thoracic Spine 2 View   Narrative CLINICAL DATA:  Chronic thoracic back pain.  EXAM: THORACIC SPINE 2 VIEWS  COMPARISON:  None.  FINDINGS: Minimal thoracolumbar curvature convex to the right. No evidence of regional fracture. No disc space narrowing. No focal lesion.  IMPRESSION: Negative except for minimal curvature.   Electronically Signed   By: Nelson Chimes M.D.   On: 03/09/2018 07:38    Complexity Note: Imaging results reviewed. Results shared with Ms. Savin, using Layman's terms.                         Meds   Current Outpatient Medications:  .  Calcium Citrate 333 MG TABS, Take 2 capsules  by mouth daily. , Disp: , Rfl:  .  Cholecalciferol (VITAMIN D3) 5000 units CAPS, Take 5,000 Units by mouth daily., Disp: , Rfl:  .  Ivacaftor ([name]) 150 MG TABS, Take 150 mg by mouth 2 (two) times daily., Disp: , Rfl:  .  metoprolol succinate (TOPROL-XL) 25 MG 24 hr tablet, Take 1 tablet by mouth 2 (two) times daily., Disp: , Rfl:  .  Multiple Vitamins-Minerals (WOMENS MULTIVITAMIN PO), Take 1 tablet by mouth daily., Disp: , Rfl:  .  Omega-3 Fatty Acids (FISH OIL) 1200 MG CAPS, Take 1 capsule by mouth daily., Disp: , Rfl:  .  sertraline (ZOLOFT) 100 MG tablet, Take 100 mg by mouth at bedtime., Disp: , Rfl:  .  simvastatin (ZOCOR) 40 MG tablet, Take 40 mg by mouth at bedtime. , Disp: , Rfl:  .  Sodium Chloride 10 % NEBU, Inhale 1 Dose into the lungs as needed., Disp: , Rfl:  .  zolpidem (AMBIEN CR) 12.5 MG CR tablet, Take 12.5 mg by mouth at bedtime as needed for sleep. , Disp: , Rfl:  .  amitriptyline (ELAVIL) 10 MG tablet, Take 1 tablet (10 mg total) by mouth at bedtime for 30 days., Disp: 30 tablet, Rfl: 0 .  galantamine (RAZADYNE) 4 MG tablet, Take 4 mg by mouth every morning., Disp: , Rfl:  .  meloxicam (MOBIC) 15 MG tablet, Take 1 tablet (15 mg total) by mouth daily for 30 days., Disp: 30 tablet, Rfl: 0  ROS  Constitutional: Denies any fever or chills Gastrointestinal: No reported hemesis, hematochezia, vomiting, or acute GI distress Musculoskeletal: Denies any acute onset joint swelling, redness, loss of ROM, or weakness Neurological: No reported episodes of acute onset apraxia, aphasia, dysarthria, agnosia, amnesia, paralysis, loss of coordination, or loss of consciousness  Allergies  Ms. Dinse is allergic to tobramycin; codeine; galantamine; and hydrocodone.  PFSH  Drug: Ms. Wurtz  reports no history of drug use. Alcohol:  reports previous alcohol use. Tobacco:  reports that she has never smoked. She has never used smokeless tobacco. Medical:  has a past medical history of  Cystic fibrosis ([place]), Depression, GERD (gastroesophageal reflux disease), History of hiatal hernia, Hypercholesteremia, and Hypertension. Surgical: Ms. Schleifer  has a past surgical history that includes Colonoscopy; Bronchoscopy; Nasal sinus surgery; Tonsillectomy; Eye surgery; Cataract extraction w/ intraocular lens  implant, bilateral (Bilateral); and Shoulder arthroscopy with open rotator cuff repair (Left, 09/15/2017). Family: family history includes Alzheimer's disease in her mother; Stroke in her mother.  Constitutional Exam  General appearance: Well nourished, well developed, and well hydrated. In no apparent acute distress Vitals:   04/24/18  1122  BP: 126/71  Pulse: (!) 105  Resp: 18  Temp: 98.3 F (36.8 C)  TempSrc: Oral  SpO2: 98%  Weight: 117 lb (53.1 kg)  Height: _0  (1.651 m)   BMI Assessment: Estimated body mass index is 19.47 kg/m as calculated from the following:   Height as of this encounter: _1  (1.651 m).   Weight as of this encounter: 117 lb (53.1 kg).  BMI interpretation table: BMI level Category Range association with higher incidence of chronic pain  <18 kg/m2 Underweight   18.5-24.9 kg/m2 Ideal body weight   25-29.9 kg/m2 Overweight Increased incidence by 20%  30-34.9 kg/m2 Obese (Class I) Increased incidence by 68%  35-39.9 kg/m2 Severe obesity (Class II) Increased incidence by 136%  >40 kg/m2 Extreme obesity (Class III) Increased incidence by 254%   Patient's current BMI Ideal Body weight  Body mass index is 19.47 kg/m. Ideal body weight: 57 kg (125 lb 10.6 oz)   BMI Readings from Last 4 Encounters:  04/24/18 19.47 kg/m  03/08/18 19.47 kg/m  12/14/17 20.80 kg/m  12/01/17 19.64 kg/m   Wt Readings from Last 4 Encounters:  04/24/18 117 lb (53.1 kg)  03/08/18 117 lb (53.1 kg)  12/14/17 125 lb (56.7 kg)  12/01/17 118 lb (53.5 kg)  Psych/Mental status: Alert, oriented x 3 (person, place, & time)       Eyes: PERLA Respiratory: No evidence of  acute respiratory distress  Cervical Spine Area Exam  Skin & Axial Inspection: No masses, redness, edema, swelling, or associated skin lesions Alignment: Symmetrical Functional ROM: Unrestricted ROM      Stability: No instability detected Muscle Tone/Strength: Functionally intact. No obvious neuro-muscular anomalies detected. Sensory (Neurological): Unimpaired Palpation: No palpable anomalies              Upper Extremity (UE) Exam    Side: Right upper extremity  Side: Left upper extremity  Skin & Extremity Inspection: Skin color, temperature, and hair growth are WNL. No peripheral edema or cyanosis. No masses, redness, swelling, asymmetry, or associated skin lesions. No contractures.  Skin & Extremity Inspection: Skin color, temperature, and hair growth are WNL. No peripheral edema or cyanosis. No masses, redness, swelling, asymmetry, or associated skin lesions. No contractures.  Functional ROM: Unrestricted ROM          Functional ROM: Unrestricted ROM          Muscle Tone/Strength: Functionally intact. No obvious neuro-muscular anomalies detected.  Muscle Tone/Strength: Functionally intact. No obvious neuro-muscular anomalies detected.  Sensory (Neurological): Unimpaired          Sensory (Neurological): Unimpaired          Palpation: No palpable anomalies              Palpation: No palpable anomalies              Provocative Test(s):  Phalen's test: deferred Tinel's test: deferred Apley's scratch test (touch opposite shoulder):  Action 1 (Across chest): deferred Action 2 (Overhead): deferred Action 3 (LB reach): deferred   Provocative Test(s):  Phalen's test: deferred Tinel's test: deferred Apley's scratch test (touch opposite shoulder):  Action 1 (Across chest): deferred Action 2 (Overhead): deferred Action 3 (LB reach): deferred    Thoracic Spine Area Exam  Skin & Axial Inspection: Tenderness over the supraspinous ligament in the thoracic region. Alignment:  Symmetrical Functional ROM: Minimal ROM Stability: No instability detected Muscle Tone/Strength: Increased muscle tone over affected area Sensory (Neurological): Movement associated pain Muscle strength & Tone:  No palpable anomalies  Lumbar Spine Area Exam  Skin & Axial Inspection: No masses, redness, or swelling Alignment: Symmetrical Functional ROM: Minimal ROM affecting both sides Stability: No instability detected Muscle Tone/Strength: Increased muscle tone over affected area Sensory (Neurological): Movement-associated pain Palpation: Complains of area being tender to palpation       Provocative Tests: Hyperextension/rotation test: (+) bilaterally for facet joint pain. Lumbar quadrant test (Kemp's test): (+) bilaterally for facet joint pain. Lateral bending test: deferred today       Patrick's Maneuver: deferred today                   FABER* test: deferred today                   S-I anterior distraction/compression test: deferred today         S-I lateral compression test: deferred today         S-I Thigh-thrust test: deferred today         S-I Gaenslen's test: deferred today         *(Flexion, ABduction and External Rotation)  Gait & Posture Assessment  Ambulation: Unassisted Gait: Relatively normal for age and body habitus Posture: WNL   Lower Extremity Exam    Side: Right lower extremity  Side: Left lower extremity  Stability: No instability observed          Stability: No instability observed          Skin & Extremity Inspection: Skin color, temperature, and hair growth are WNL. No peripheral edema or cyanosis. No masses, redness, swelling, asymmetry, or associated skin lesions. No contractures.  Skin & Extremity Inspection: Skin color, temperature, and hair growth are WNL. No peripheral edema or cyanosis. No masses, redness, swelling, asymmetry, or associated skin lesions. No contractures.  Functional ROM: Unrestricted ROM                  Functional ROM: Unrestricted  ROM                  Muscle Tone/Strength: Functionally intact. No obvious neuro-muscular anomalies detected.  Muscle Tone/Strength: Functionally intact. No obvious neuro-muscular anomalies detected.  Sensory (Neurological): Unimpaired        Sensory (Neurological): Unimpaired        DTR: Patellar: deferred today Achilles: deferred today Plantar: deferred today  DTR: Patellar: deferred today Achilles: deferred today Plantar: deferred today  Palpation: No palpable anomalies  Palpation: No palpable anomalies   Assessment  Primary Diagnosis & Pertinent Problem List: The primary encounter diagnosis was Chronic upper back pain (Bilateral). Diagnoses of Thoracic facet syndrome (Bilateral), Chronic shoulder pain (Primary Area of Pain) (Left), Chronic low back pain (Tertiary Area of Pain) (Right), Osteoarthritis involving multiple joints, Neurogenic pain, Muscle pain, fibromyalgia, Allodynia, Abnormal x-ray of cervical spine, and Abnormal MRI, cervical spine (03/24/2018) were also pertinent to this visit.  Status Diagnosis  Controlled Controlled Controlled 1. Chronic upper back pain (Bilateral)   2. Thoracic facet syndrome (Bilateral)   3. Chronic shoulder pain (Primary Area of Pain) (Left)   4. Chronic low back pain [facility] Area of Pain) (Right)   5. Osteoarthritis involving multiple joints   6. Neurogenic pain   7. Muscle pain, fibromyalgia   8. Allodynia   9. Abnormal x-ray of cervical spine   10. Abnormal MRI, cervical spine (03/24/2018)     Problems updated and reviewed during this visit: Problem  Abnormal MRI, cervical spine (03/24/2018)   C4-5: Tiny broad-based  disc bulge with no neural impingement. No foraminal stenosis. Moderate left facet arthritis. C5-6: 2 mm anterolisthesis of C5 on C6. Moderate right facet arthritis. No disc bulging or protrusion. No foraminal stenosis. C6-7: There is a small focal soft disc protrusion into the origin of the right neural foramen best seen on  image 16 of series 5 and series 6. This could affect the right C7 nerve.   Thoracic facet syndrome (Bilateral)  Osteoarthritis involving multiple joints  Neurogenic Pain  Muscle Pain, Fibromyalgia  Allodynia  Abnormal X-Ray of Cervical Spine   FINDINGS: 3 mm degenerative anterolisthesis at C5-6. With flexion, 3 mm of degenerative anterolisthesis occurs at C4-5 in the anterolisthesis at C5-6 increases to 5 mm. With extension, the C4-5 slippage reduces an the C5-6 slippage returns 2 3 mm. There is facet arthropathy at C4-5 and C5-6 responsible for this. There is osteophytic foraminal encroachment at these 2 levels as well, worse on the right than the left. Left-sided facet arthritis is more pronounced at C3-4 and C4-5 and right-sided facet arthritis is more pronounced at C4-5 and C5-6. there is only mild disc space narrowing at C4-5.  IMPRESSION: Facet osteoarthritis, more pronounced on the left at C3-4 and C4-5 and on the right at C4-5 and C5-6. Anterolisthesis at C4-5 and C5-6 at worsens with flexion as described above. Foraminal narrowing most pronounced on the right at C4-5 and C5-6 due to osteophytic encroachment.   Cervical facet arthropathy (Bilateral)   C4-5: Moderate left facet arthritis. C5-6: 2 mm anterolisthesis of C5 on C6. Moderate right facet arthritis.   Left Humeral Fracture  Chronic upper back pain (Bilateral)  Medicare Annual Wellness Visit, Initial   12/19    Plan of Care  Pharmacotherapy (Medications Ordered): Meds ordered this encounter  Medications  . meloxicam (MOBIC) 15 MG tablet    Sig: Take 1 tablet (15 mg total) by mouth daily for 30 days.    Dispense:  30 tablet    Refill:  0    Do not add this medication to the electronic "Automatic Refill" notification system. Patient may have prescription filled one day early if pharmacy is closed on scheduled refill date.  Marland Kitchen amitriptyline (ELAVIL) 10 MG tablet    Sig: Take 1 tablet (10 mg total) by mouth at bedtime  for 30 days.    Dispense:  30 tablet    Refill:  0   Medications administered today: Connye Burkitt. Righetti had no medications administered during this visit.   Procedure Orders     THORACIC FACET BLOCK Lab Orders  No laboratory test(s) ordered today   Imaging Orders  No imaging studies ordered today   Referral Orders  No referral(s) requested today   Interventional management options: Planned, scheduled, and/or pending:   Diagnostic bilateral thoracic facet block #1 under fluoroscopic guidance and IV sedation. Elavil trial and at 10 mg p.o. at bedtime.  The patient seems to have some hyperalgesia and allodynia with very poor pain tolerance.  We aimed at increasing her serotonin level so that she can tolerate better her pain. Mobic trial at 15 mg p.o. daily with breakfast. Next, I may add some tramadol to this regimen, if needed.   Considering:   Diagnostic (Midline) cervical ESI  Diagnostic bilateral cervical facet blocks  Possible bilateral cervical facet RFA  Diagnostic left supraspinatus tendon sheath injection  Diagnostic left infraspinatus tendon sheath injection  Diagnostic left subacromial bursa injection  Diagnostic left subdeltoid bursa injection  Diagnostic left acromioclavicular joint injection  Diagnostic left suprascapular nerve block Possible left suprascapular RFA Diagnostic bilateral lumbar facet block  Possible bilateral lumbar facet RFA    Palliative PRN treatment(s):   None at this time   Provider-requested follow-up: Return for Procedure (w/ sedation): (B) T-FCT BLK #1.  Future Appointments  Date Time Provider [place]  04/25/2018  9:45 AM Milinda Pointer, MD [facility] None   Primary Care Physician: Rusty Aus, MD Location: [facility] Note by: Gaspar Cola, MD Date: 04/24/2018; Time: 1:13 PM

## 2018-04-24 ENCOUNTER — Other Ambulatory Visit: Payer: Self-pay

## 2018-04-24 ENCOUNTER — Ambulatory Visit: Payer: Medicare Other | Attending: Pain Medicine | Admitting: Pain Medicine

## 2018-04-24 ENCOUNTER — Encounter: Payer: Self-pay | Admitting: Pain Medicine

## 2018-04-24 VITALS — BP 126/71 | HR 105 | Temp 98.3°F | Resp 18 | Ht 65.0 in | Wt 117.0 lb

## 2018-04-24 DIAGNOSIS — M25512 Pain in left shoulder: Secondary | ICD-10-CM

## 2018-04-24 DIAGNOSIS — M797 Fibromyalgia: Secondary | ICD-10-CM | POA: Diagnosis present

## 2018-04-24 DIAGNOSIS — M792 Neuralgia and neuritis, unspecified: Secondary | ICD-10-CM

## 2018-04-24 DIAGNOSIS — M150 Primary generalized (osteo)arthritis: Secondary | ICD-10-CM

## 2018-04-24 DIAGNOSIS — M549 Dorsalgia, unspecified: Secondary | ICD-10-CM

## 2018-04-24 DIAGNOSIS — M47814 Spondylosis without myelopathy or radiculopathy, thoracic region: Secondary | ICD-10-CM | POA: Diagnosis present

## 2018-04-24 DIAGNOSIS — M159 Polyosteoarthritis, unspecified: Secondary | ICD-10-CM

## 2018-04-24 DIAGNOSIS — R937 Abnormal findings on diagnostic imaging of other parts of musculoskeletal system: Secondary | ICD-10-CM

## 2018-04-24 DIAGNOSIS — G8929 Other chronic pain: Secondary | ICD-10-CM | POA: Diagnosis not present

## 2018-04-24 DIAGNOSIS — R208 Other disturbances of skin sensation: Secondary | ICD-10-CM

## 2018-04-24 DIAGNOSIS — M545 Low back pain: Secondary | ICD-10-CM

## 2018-04-24 DIAGNOSIS — M47894 Other spondylosis, thoracic region: Secondary | ICD-10-CM | POA: Insufficient documentation

## 2018-04-24 MED ORDER — IVACAFTOR 150 MG TABLET
ORAL_TABLET | Freq: Two times a day (BID) | ORAL | 11 refills | 28.00000 days | Status: CP
Start: 2018-04-24 — End: 2019-04-24
  Filled 2018-04-27: qty 56, 28d supply, fill #0

## 2018-04-24 MED ORDER — AMITRIPTYLINE HCL 10 MG PO TABS: 10.0000 mg | ORAL_TABLET | Freq: Every day | ORAL | 0 refills | Status: DC

## 2018-04-24 MED ORDER — MELOXICAM 15 MG PO TABS: 15.0000 mg | ORAL_TABLET | Freq: Every day | ORAL | 0 refills | Status: DC

## 2018-04-24 NOTE — Patient Instructions (Addendum)
____________________________________________________________________________________________  Preparing for Procedure with Sedation  Instructions: . Oral Intake: Do not eat or drink anything for at least 8 hours prior to your procedure. . Transportation: Public transportation is not allowed. Bring an adult driver. The driver must be physically present in our waiting room before any procedure can be started. . Physical Assistance: Bring an adult physically capable of assisting you, in the event you need help. This adult should keep you company at home for at least 6 hours after the procedure. . Blood Pressure Medicine: Take your blood pressure medicine with a sip of water the morning of the procedure. . Blood thinners: Notify our staff if you are taking any blood thinners. Depending on which one you take, there will be specific instructions on how and when to stop it. . Diabetics on insulin: Notify the staff so that you can be scheduled 1st case in the morning. If your diabetes requires high dose insulin, take only  of your normal insulin dose the morning of the procedure and notify the staff that you have done so. . Preventing infections: Shower with an antibacterial soap the morning of your procedure. . Build-up your immune system: Take 1000 mg of Vitamin C with every meal (3 times a day) the day prior to your procedure. . Antibiotics: Inform the staff if you have a condition or reason that requires you to take antibiotics before dental procedures. . Pregnancy: If you are pregnant, call and cancel the procedure. . Sickness: If you have a cold, fever, or any active infections, call and cancel the procedure. . Arrival: You must be in the facility at least 30 minutes prior to your scheduled procedure. . Children: Do not bring children with you. . Dress appropriately: Bring dark clothing that you would not mind if they get stained. . Valuables: Do not bring any jewelry or valuables.  Procedure  appointments are reserved for interventional treatments only. . No Prescription Refills. . No medication changes will be discussed during procedure appointments. . No disability issues will be discussed.  Reasons to call and reschedule or cancel your procedure: (Following these recommendations will minimize the risk of a serious complication.) . Surgeries: Avoid having procedures within 2 weeks of any surgery. (Avoid for 2 weeks before or after any surgery). . Flu Shots: Avoid having procedures within 2 weeks of a flu shots or . (Avoid for 2 weeks before or after immunizations). . Barium: Avoid having a procedure within 7-10 days after having had a radiological study involving the use of radiological contrast. (Myelograms, Barium swallow or enema study). . Heart attacks: Avoid any elective procedures or surgeries for the initial 6 months after a "Myocardial Infarction" (Heart Attack). . Blood thinners: It is imperative that you stop these medications before procedures. Let us know if you if you take any blood thinner.  . Infection: Avoid procedures during or within two weeks of an infection (including chest colds or gastrointestinal problems). Symptoms associated with infections include: Localized redness, fever, chills, night sweats or profuse sweating, burning sensation when voiding, cough, congestion, stuffiness, runny nose, sore throat, diarrhea, nausea, vomiting, cold or Flu symptoms, recent or current infections. It is specially important if the infection is over the area that we intend to treat. . Heart and lung problems: Symptoms that may suggest an active cardiopulmonary problem include: cough, chest pain, breathing difficulties or shortness of breath, dizziness, ankle swelling, uncontrolled high or unusually low blood pressure, and/or palpitations. If you are experiencing any of these symptoms, cancel   your procedure and contact your primary care physician for an evaluation.  Remember:   Regular Business hours are:  Monday to Thursday 8:00 AM to 4:00 PM  Provider's Schedule: Ayanni Tun, MD:  Procedure days: Tuesday and Thursday 7:30 AM to 4:00 PM  Bilal Lateef, MD:  Procedure days: Monday and Wednesday 7:30 AM to 4:00 PM ____________________________________________________________________________________________    ______________________________________________________________________________________________  Specialty Pain Scale  Introduction:  There are significant differences in how pain is reported. The word pain usually refers to physical pain, but it is also a common synonym of suffering. The medical community uses a scale from 0 (zero) to 10 (ten) to report pain level. Zero (0) is described as "no pain", while ten (10) is described as "the worse pain you can imagine". The problem with this scale is that physical pain is reported along with suffering. Suffering refers to mental pain, or more often yet it refers to any unpleasant feeling, emotion or aversion associated with the perception of harm or threat of harm. It is the psychological component of pain.  Pain Specialists prefer to separate the two components. The pain scale used by this practice is the Verbal Numerical Rating Scale (VNRS-11). This scale is for the physical pain only. DO NOT INCLUDE how your pain psychologically affects you. This scale is for adults 21 years of age and older. It has 11 (eleven) levels. The 1st level is 0/10. This means: "right now, I have no pain". In the context of pain management, it also means: "right now, my physical pain is under control with the current therapy".  General Information:  The scale should reflect your current level of pain. Unless you are specifically asked for the level of your worst pain, or your average pain. If you are asked for one of these two, then it should be understood that it is over the past 24 hours.  Levels 1 (one) through 5 (five) are  described below, and can be treated as an outpatient. Ambulatory pain management facilities such as ours are more than adequate to treat these levels. Levels 6 (six) through 10 (ten) are also described below, however, these must be treated as a hospitalized patient. While levels 6 (six) and 7 (seven) may be evaluated at an urgent care facility, levels 8 (eight) through 10 (ten) constitute medical emergencies and as such, they belong in a hospital's emergency department. When having these levels (as described below), do not come to our office. Our facility is not equipped to manage these levels. Go directly to an urgent care facility or an emergency department to be evaluated.  Definitions:  Activities of Daily Living (ADL): Activities of daily living (ADL or ADLs) is a term used in healthcare to refer to people's daily self-care activities. Health professionals often use a person's ability or inability to perform ADLs as a measurement of their functional status, particularly in regard to people post injury, with disabilities and the elderly. There are two ADL levels: Basic and Instrumental. Basic Activities of Daily Living (BADL  or BADLs) consist of self-care tasks that include: Bathing and showering; personal hygiene and grooming (including brushing/combing/styling hair); dressing; Toilet hygiene (getting to the toilet, cleaning oneself, and getting back up); eating and self-feeding (not including cooking or chewing and swallowing); functional mobility, often referred to as "transferring", as measured by the ability to walk, get in and out of bed, and get into and out of a chair; the broader definition (moving from one place to another while performing activities)   is useful for people with different physical abilities who are still able to get around independently. Basic ADLs include the things many people do when they get up in the morning and get ready to go out of the house: get out of bed, go to the  toilet, bathe, dress, groom, and eat. On the average, loss of function typically follows a particular order. Hygiene is the first to go, followed by loss of toilet use and locomotion. The last to go is the ability to eat. When there is only one remaining area in which the person is independent, there is a 62.9% chance that it is eating and only a 3.5% chance that it is hygiene. Instrumental Activities of Daily Living (IADL or IADLs) are not necessary for fundamental functioning, but they let an individual live independently in a community. IADL consist of tasks that include: cleaning and maintaining the house; home establishment and maintenance; care of others (including selecting and supervising caregivers); care of pets; child rearing; managing money; managing financials (investments, etc.); meal preparation and cleanup; shopping for groceries and necessities; moving within the community; safety procedures and emergency responses; health management and maintenance (taking prescribed medications); and using the telephone or other form of communication.  Instructions:  Most patients tend to report their pain as a combination of two factors, their physical pain and their psychosocial pain. This last one is also known as "suffering" and it is reflection of how physical pain affects you socially and psychologically. From now on, report them separately.  From this point on, when asked to report your pain level, report only your physical pain. Use the following table for reference.  Pain Clinic Pain Levels (0-5/10)  Pain Level Score  Description  No Pain 0   Mild pain 1 Nagging, annoying, but does not interfere with basic activities of daily living (ADL). Patients are able to eat, bathe, get dressed, toileting (being able to get on and off the toilet and perform personal hygiene functions), transfer (move in and out of bed or a chair without assistance), and maintain continence (able to control bladder and  bowel functions). Blood pressure and heart rate are unaffected. A normal heart rate for a healthy adult ranges from 60 to 100 bpm (beats per minute).   Mild to moderate pain 2 Noticeable and distracting. Impossible to hide from other people. More frequent flare-ups. Still possible to adapt and function close to normal. It can be very annoying and may have occasional stronger flare-ups. With discipline, patients may get used to it and adapt.   Moderate pain 3 Interferes significantly with activities of daily living (ADL). It becomes difficult to feed, bathe, get dressed, get on and off the toilet or to perform personal hygiene functions. Difficult to get in and out of bed or a chair without assistance. Very distracting. With effort, it can be ignored when deeply involved in activities.   Moderately severe pain 4 Impossible to ignore for more than a few minutes. With effort, patients may still be able to manage work or participate in some social activities. Very difficult to concentrate. Signs of autonomic nervous system discharge are evident: dilated pupils (mydriasis); mild sweating (diaphoresis); sleep interference. Heart rate becomes elevated (>115 bpm). Diastolic blood pressure (lower number) rises above 100 mmHg. Patients find relief in laying down and not moving.   Severe pain 5 Intense and extremely unpleasant. Associated with frowning face and frequent crying. Pain overwhelms the senses.  Ability to do any activity or maintain social   relationships becomes significantly limited. Conversation becomes difficult. Pacing back and forth is common, as getting into a comfortable position is nearly impossible. Pain wakes you up from deep sleep. Physical signs will be obvious: pupillary dilation; increased sweating; goosebumps; brisk reflexes; cold, clammy hands and feet; nausea, vomiting or dry heaves; loss of appetite; significant sleep disturbance with inability to fall asleep or to remain asleep. When  persistent, significant weight loss is observed due to the complete loss of appetite and sleep deprivation.  Blood pressure and heart rate becomes significantly elevated. Caution: If elevated blood pressure triggers a pounding headache associated with blurred vision, then the patient should immediately seek attention at an urgent or emergency care unit, as these may be signs of an impending stroke.    Emergency Department Pain Levels (6-10/10)  Emergency Room Pain 6 Severely limiting. Requires emergency care and should not be seen or managed at an outpatient pain management facility. Communication becomes difficult and requires great effort. Assistance to reach the emergency department may be required. Facial flushing and profuse sweating along with potentially dangerous increases in heart rate and blood pressure will be evident.   Distressing pain 7 Self-care is very difficult. Assistance is required to transport, or use restroom. Assistance to reach the emergency department will be required. Tasks requiring coordination, such as bathing and getting dressed become very difficult.   Disabling pain 8 Self-care is no longer possible. At this level, pain is disabling. The individual is unable to do even the most "basic" activities such as walking, eating, bathing, dressing, transferring to a bed, or toileting. Fine motor skills are lost. It is difficult to think clearly.   Incapacitating pain 9 Pain becomes incapacitating. Thought processing is no longer possible. Difficult to remember your own name. Control of movement and coordination are lost.   The worst pain imaginable 10 At this level, most patients pass out from pain. When this level is reached, collapse of the autonomic nervous system occurs, leading to a sudden drop in blood pressure and heart rate. This in turn results in a temporary and dramatic drop in blood flow to the brain, leading to a loss of consciousness. Fainting is one of the body's  self defense mechanisms. Passing out puts the brain in a calmed state and causes it to shut down for a while, in order to begin the healing process.    Summary: 1. Refer to this scale when providing [place] with your pain level. 2. Be accurate and careful when reporting your pain level. This will help with your care. 3. Over-reporting your pain level will lead to loss of credibility. 4. Even a level of 1/10 means that there is pain and will be treated at our facility. 5. High, inaccurate reporting will be documented as "Symptom Exaggeration", leading to loss of credibility and suspicions of possible secondary gains such as obtaining more narcotics, or wanting to appear disabled, for fraudulent reasons. 6. Only pain levels of 5 or below will be seen at our facility. 7. Pain levels of 6 and above will be sent to the Emergency Department and the appointment cancelled. ______________________________________________________________________________________________   ____________________________________________________________________________________________  General Risks and Possible Complications  Patient Responsibilities: It is important that you read this as it is part of your informed consent. It is our duty to inform you of the risks and possible complications associated with treatments offered to you. It is your responsibility as a patient to read this and to ask questions about anything that is not clear  or that you believe was not covered in this document.  Patient's Rights: You have the right to refuse treatment. You also have the right to change your mind, even after initially having agreed to have the treatment done. However, under this last option, if you wait until the last second to change your mind, you may be charged for the materials used up to that point.  Introduction: Medicine is not an Chief Strategy Officer. Everything in Medicine, including the lack of treatment(s), carries the potential  for danger, harm, or loss (which is by definition: Risk). In Medicine, a complication is a secondary problem, condition, or disease that can aggravate an already existing one. All treatments carry the risk of possible complications. The fact that a side effects or complications occurs, does not imply that the treatment was conducted incorrectly. It must be clearly understood that these can happen even when everything is done following the highest safety standards.  No treatment: You can choose not to proceed with the proposed treatment alternative. The "PRO(s)" would include: avoiding the risk of complications associated with the therapy. The "CON(s)" would include: not getting any of the treatment benefits. These benefits fall under one of three categories: diagnostic; therapeutic; and/or palliative. Diagnostic benefits include: getting information which can ultimately lead to improvement of the disease or symptom(s). Therapeutic benefits are those associated with the successful treatment of the disease. Finally, palliative benefits are those related to the decrease of the primary symptoms, without necessarily curing the condition (example: decreasing the pain from a flare-up of a chronic condition, such as incurable terminal cancer).  General Risks and Complications: These are associated to most interventional treatments. They can occur alone, or in combination. They fall under one of the following six (6) categories: no benefit or worsening of symptoms; bleeding; infection; nerve damage; allergic reactions; and/or death. 1. No benefits or worsening of symptoms: In Medicine there are no guarantees, only probabilities. No healthcare provider can ever guarantee that a medical treatment will work, they can only state the probability that it may. Furthermore, there is always the possibility that the condition may worsen, either directly, or indirectly, as a consequence of the treatment. 2. Bleeding: This is more  common if the patient is taking a blood thinner, either prescription or over the counter (example: Goody Powders, Fish oil, Aspirin, Garlic, etc.), or if suffering a condition associated with impaired coagulation (example: Hemophilia, cirrhosis of the liver, low platelet counts, etc.). However, even if you do not have one on these, it can still happen. If you have any of these conditions, or take one of these drugs, make sure to notify your treating physician. 3. Infection: This is more common in patients with a compromised immune system, either due to disease (example: diabetes, cancer, human immunodeficiency virus [HIV], etc.), or due to medications or treatments (example: therapies used to treat cancer and rheumatological diseases). However, even if you do not have one on these, it can still happen. If you have any of these conditions, or take one of these drugs, make sure to notify your treating physician. 4. Nerve Damage: This is more common when the treatment is an invasive one, but it can also happen with the use of medications, such as those used in the treatment of cancer. The damage can occur to small secondary nerves, or to large primary ones, such as those in the spinal cord and brain. This damage may be temporary or permanent and it may lead to impairments that can range from  temporary numbness to permanent paralysis and/or brain death. 5. Allergic Reactions: Any time a substance or material comes in contact with our body, there is the possibility of an allergic reaction. These can range from a mild skin rash (contact dermatitis) to a severe systemic reaction (anaphylactic reaction), which can result in death. 6. Death: In general, any medical intervention can result in death, most of the time due to an unforeseen complication. ____________________________________________________________________________________________    Amitriptyline and mobic given. Facet Blocks Patient  Information  Description: The facets are joints in the spine between the vertebrae.  Like any joints in the body, facets can become irritated and painful.  Arthritis can also effect the facets.  By injecting steroids and local anesthetic in and around these joints, we can temporarily block the nerve supply to them.  Steroids act directly on irritated nerves and tissues to reduce selling and inflammation which often leads to decreased pain.  Facet blocks may be done anywhere along the spine from the neck to the low back depending upon the location of your pain.   After numbing the skin with local anesthetic (like Novocaine), a small needle is passed onto the facet joints under x-ray guidance.  You may experience a sensation of pressure while this is being done.  The entire block usually lasts about 15-25 minutes.   Conditions which may be treated by facet blocks:  Low back/buttock pain Neck/shoulder pain Certain types of headaches  Preparation for the injection:  Do not eat any solid food or dairy products within 8 hours of your appointment. You may drink clear liquid up to 3 hours before appointment.  Clear liquids include water, black coffee, juice or soda.  No milk or cream please. You may take your regular medication, including pain medications, with a sip of water before your appointment.  Diabetics should hold regular insulin (if taken separately) and take 1/2 normal NPH dose the morning of the procedure.  Carry some sugar containing items with you to your appointment. A driver must accompany you and be prepared to drive you home after your procedure. Bring all your current medications with you. An IV may be inserted and sedation may be given at the discretion of the physician. A blood pressure cuff, EKG and other monitors will often be applied during the procedure.  Some patients may need to have extra oxygen administered for a short period. You will be asked to provide medical information,  including your allergies and medications, prior to the procedure.  We must know immediately if you are taking blood thinners (like Coumadin/Warfarin) or if you are allergic to IV iodine contrast (dye).  We must know if you could possible be pregnant.  Possible side-effects:  Bleeding from needle site Infection (rare, may require surgery) Nerve injury (rare) Numbness & tingling (temporary) Difficulty urinating (rare, temporary) Spinal headache (a headache worse with upright posture) Light-headedness (temporary) Pain at injection site (serveral days) Decreased blood pressure (rare, temporary) Weakness in arm/leg (temporary) Pressure sensation in back/neck (temporary)   Call if you experience:  Fever/chills associated with headache or increased back/neck pain Headache worsened by an upright position New onset, weakness or numbness of an extremity below the injection site Hives or difficulty breathing (go to the emergency room) Inflammation or drainage at the injection site(s) Severe back/neck pain greater than usual New symptoms which are concerning to you  Please note:  Although the local anesthetic injected can often make your back or neck feel good for several hours after  the injection, the pain will likely return. It takes 3-7 days for steroids to work.  You may not notice any pain relief for at least one week.  If effective, we will often do a series of 2-3 injections spaced 3-6 weeks apart to maximally decrease your pain.  After the initial series, you may be a candidate for a more permanent nerve block of the facets.  If you have any questions, please call #[phone]) [facility]

## 2018-04-24 NOTE — Progress Notes (Signed)
Safety precautions to be maintained throughout the outpatient stay will include: orient to surroundings, keep bed in low position, maintain call bell within reach at all times, provide assistance with transfer out of bed and ambulation.  

## 2018-04-24 NOTE — Unmapped (Signed)
[facility] Refill Coordination Note    Specialty Medication(s) to be Shipped:   CF/Pulmonary: -[name] (ivacaftor) 150mg  tablet    Other medication(s) to be shipped: N/A     Alyssa Willis, DOB: 20-Jan-1946  Phone: [phone] (home)       All above HIPAA information was verified with patient's family member.     Completed refill call assessment today to schedule patient's medication shipment from the [facility] [phone]).       Specialty medication(s) and dose(s) confirmed: Regimen is correct and unchanged.   Changes to medications: [name] reports no changes reported at this time.  Changes to insurance: No  Questions for the pharmacist: No    The patient will receive a drug information handout for each medication shipped and additional FDA Medication Guides as required.      DISEASE/MEDICATION-SPECIFIC INFORMATION        For CF patients: CF [facility] Active? Yes    ADHERENCE     Medication Adherence    Patient reported X missed doses in the last month:  0  Specialty Medication:  kalydeco  Patient is on additional specialty medications:  No  Patient is on more than two specialty medications:  No  Any gaps in refill history greater than 2 weeks in the last 3 months:  no  Support network for adherence:  family member          Refill Coordination    Has the Patients' Contact Information Changed:  No  Is the Shipping Address Different:  No         MEDICARE PART B DOCUMENTATION     kalydeco: Patient has 1 week on hand.    SHIPPING     Shipping address confirmed in Epic.     Delivery Scheduled: Yes, Expected medication delivery date: 04/28/2018 via UPS or courier.     Medication will be delivered via Next Day Courier to the home address in [employer] [place].    Jahrel Borthwick Perlie Gold   [facility] Specialty Technician

## 2018-04-25 ENCOUNTER — Other Ambulatory Visit: Payer: Self-pay

## 2018-04-25 ENCOUNTER — Encounter: Payer: Self-pay | Admitting: Pain Medicine

## 2018-04-25 ENCOUNTER — Ambulatory Visit (HOSPITAL_BASED_OUTPATIENT_CLINIC_OR_DEPARTMENT_OTHER): Payer: Medicare Other | Admitting: Pain Medicine

## 2018-04-25 ENCOUNTER — Ambulatory Visit
Admission: RE | Admit: 2018-04-25 | Discharge: 2018-04-25 | Disposition: A | Discharge status: Home / Self Care | Payer: Medicare Other | Source: Ambulatory Visit | Attending: Pain Medicine | Admitting: Pain Medicine

## 2018-04-25 VITALS — BP 148/80 | HR 94 | Temp 98.1°F | Resp 20 | Ht 65.0 in | Wt 117.0 lb

## 2018-04-25 DIAGNOSIS — M546 Pain in thoracic spine: Secondary | ICD-10-CM | POA: Insufficient documentation

## 2018-04-25 DIAGNOSIS — M549 Dorsalgia, unspecified: Secondary | ICD-10-CM | POA: Diagnosis present

## 2018-04-25 DIAGNOSIS — M47814 Spondylosis without myelopathy or radiculopathy, thoracic region: Secondary | ICD-10-CM | POA: Insufficient documentation

## 2018-04-25 DIAGNOSIS — G8929 Other chronic pain: Secondary | ICD-10-CM | POA: Insufficient documentation

## 2018-04-25 DIAGNOSIS — M47894 Other spondylosis, thoracic region: Secondary | ICD-10-CM

## 2018-04-25 MED ORDER — DEXAMETHASONE SODIUM PHOSPHATE 10 MG/ML IJ SOLN
10.0000 mg | Freq: Once | INTRAMUSCULAR | Status: AC
  Administered 2018-04-25: 10 mg
  Filled 2018-04-25: qty 1

## 2018-04-25 MED ORDER — MIDAZOLAM HCL 5 MG/5ML IJ SOLN
1.0000 mg | INTRAMUSCULAR | Status: DC | PRN
  Administered 2018-04-25: 2 mg via INTRAVENOUS
  Filled 2018-04-25: qty 5

## 2018-04-25 MED ORDER — FENTANYL CITRATE (PF) 100 MCG/2ML IJ SOLN
25.0000 ug | INTRAMUSCULAR | Status: DC | PRN
  Administered 2018-04-25: 50 ug via INTRAVENOUS
  Filled 2018-04-25: qty 2

## 2018-04-25 MED ORDER — LIDOCAINE HCL 2 % IJ SOLN
20.0000 mL | Freq: Once | INTRAMUSCULAR | Status: AC
  Administered 2018-04-25: 400 mg
  Filled 2018-04-25: qty 40

## 2018-04-25 MED ORDER — ROPIVACAINE HCL 2 MG/ML IJ SOLN
18.0000 mL | Freq: Once | INTRAMUSCULAR | Status: AC
  Administered 2018-04-25: 18 mL via PERINEURAL
  Filled 2018-04-25: qty 20

## 2018-04-25 MED ORDER — LACTATED RINGERS IV SOLN
1000.0000 mL | Freq: Once | INTRAVENOUS | Status: AC
  Administered 2018-04-25: 1000 mL via INTRAVENOUS

## 2018-04-25 NOTE — Progress Notes (Signed)
Safety precautions to be maintained throughout the outpatient stay will include: orient to surroundings, keep bed in low position, maintain call bell within reach at all times, provide assistance with transfer out of bed and ambulation.  

## 2018-04-25 NOTE — Patient Instructions (Signed)

## 2018-04-25 NOTE — Progress Notes (Signed)
Patient's Name: Brenda Clarke  MRN: [id number]  Referring Provider: Rusty Aus, MD  DOB: 02-22-1946  PCP: Rusty Aus, MD  DOS: 04/25/2018  Note by: Gaspar Cola, MD  Service setting: Ambulatory outpatient  Specialty: Interventional Pain Management  Patient type: Established  Location: [facility] (AMB) Pain Management Facility  Visit type: Interventional Procedure   Primary Reason for Visit: Interventional Pain Management Treatment. CC: Back Pain (lower)  Procedure:          Anesthesia, Analgesia, Anxiolysis:  Type: Therapeutic Medial Branch Facet Block  #1  Region:Thoracic Level: T2, T3, T4, T5, T6, & T7  Medial Branch Level(s) Laterality: Bilateral  Type: Moderate (Conscious) Sedation combined with Local Anesthesia Indication(s): Analgesia and Anxiety Route: Intravenous (IV) IV Access: Secured Sedation: Meaningful verbal contact was maintained at all times during the procedure  Local Anesthetic: Lidocaine 1-2%  Position: Prone   Indications: 1. Spondylosis without myelopathy or radiculopathy, thoracic region   2. Thoracic facet syndrome (Bilateral)   3. Chronic thoracic back pain (Bilateral)   4. Chronic upper back pain (Bilateral)    Pain Score: Pre-procedure: 5 /10 Post-procedure: 0-No pain/10  Pre-op Assessment:  [name] is a 73 y.o. (year old), female patient, seen today for interventional treatment. She  has a past surgical history that includes Colonoscopy; Bronchoscopy; Nasal sinus surgery; Tonsillectomy; Eye surgery; Cataract extraction w/ intraocular lens  implant, bilateral (Bilateral); and Shoulder arthroscopy with open rotator cuff repair (Left, 09/15/2017). [name] has a current medication list which includes the following prescription(s): amitriptyline, calcium citrate, vitamin d3, ivacaftor, meloxicam, metoprolol succinate, multiple vitamins-minerals, fish oil, sertraline, simvastatin, sodium chloride, zolpidem, and galantamine, and the following  Facility-Administered Medications: fentanyl and midazolam. Her primarily concern today is the Back Pain (lower)  Initial Vital Signs:  Pulse/HCG Rate: 94ECG Heart Rate: 89 Temp: (!) 97.4 F (36.3 C) Resp: 16 BP: 130/79 SpO2: 99 %  BMI: Estimated body mass index is 19.47 kg/m as calculated from the following:   Height as of this encounter: 5\' 5"  (1.651 m).   Weight as of this encounter: 117 lb (53.1 kg).  Risk Assessment: Allergies: Reviewed. She is allergic to tobramycin; codeine; galantamine; and hydrocodone.  Allergy Precautions: None required Coagulopathies: Reviewed. None identified.  Blood-thinner therapy: None at this time Active Infection(s): Reviewed. None identified. [name] is afebrile  Site Confirmation: [name] was asked to confirm the procedure and laterality before marking the site Procedure checklist: Completed Consent: Before the procedure and under the influence of no sedative(s), amnesic(s), or anxiolytics, the patient was informed of the treatment options, risks and possible complications. To fulfill our ethical and legal obligations, as recommended by the American Medical Association's Code of Ethics, I have informed the patient of my clinical impression; the nature and purpose of the treatment or procedure; the risks, benefits, and possible complications of the intervention; the alternatives, including doing nothing; the risk(s) and benefit(s) of the alternative treatment(s) or procedure(s); and the risk(s) and benefit(s) of doing nothing. The patient was provided information about the general risks and possible complications associated with the procedure. These may include, but are not limited to: failure to achieve desired goals, infection, bleeding, organ or nerve damage, allergic reactions, paralysis, and death. In addition, the patient was informed of those risks and complications associated to Spine-related procedures, such as failure to decrease pain;  infection (i.e.: Meningitis, epidural or intraspinal abscess); bleeding (i.e.: epidural hematoma, subarachnoid hemorrhage, or any other type of intraspinal or peri-dural bleeding); organ or  nerve damage (i.e.: Any type of peripheral nerve, nerve root, or spinal cord injury) with subsequent damage to sensory, motor, and/or autonomic systems, resulting in permanent pain, numbness, and/or weakness of one or several areas of the body; allergic reactions; (i.e.: anaphylactic reaction); and/or death. Furthermore, the patient was informed of those risks and complications associated with the medications. These include, but are not limited to: allergic reactions (i.e.: anaphylactic or anaphylactoid reaction(s)); adrenal axis suppression; blood sugar elevation that in diabetics may result in ketoacidosis or comma; water retention that in patients with history of congestive heart failure may result in shortness of breath, pulmonary edema, and decompensation with resultant heart failure; weight gain; swelling or edema; medication-induced neural toxicity; particulate matter embolism and blood vessel occlusion with resultant organ, and/or nervous system infarction; and/or aseptic necrosis of one or more joints. Finally, the patient was informed that Medicine is not an exact science; therefore, there is also the possibility of unforeseen or unpredictable risks and/or possible complications that may result in a catastrophic outcome. The patient indicated having understood very clearly. We have given the patient no guarantees and we have made no promises. Enough time was given to the patient to ask questions, all of which were answered to the patient's satisfaction. [name] has indicated that she wanted to continue with the procedure. Attestation: I, the ordering provider, attest that I have discussed with the patient the benefits, risks, side-effects, alternatives, likelihood of achieving goals, and potential problems during  recovery for the procedure that I have provided informed consent. Date  Time: 04/25/2018  9:47 AM  Pre-Procedure Preparation:  Monitoring: As per clinic protocol. Respiration, ETCO2, SpO2, BP, heart rate and rhythm monitor placed and checked for adequate function Safety Precautions: Patient was assessed for positional comfort and pressure points before starting the procedure. Time-out: I initiated and conducted the "Time-out" before starting the procedure, as per protocol. The patient was asked to participate by confirming the accuracy of the "Time Out" information. Verification of the correct person, site, and procedure were performed and confirmed by me, the nursing staff, and the patient. "Time-out" conducted as per Joint Commission's Universal Protocol (UP.01.01.01). Time: 1020  Description of Procedure:          Target Area: The target area is a superior and lateral portion of the thoracic transverse processes more distal to the joint itself then and the lumbar region. Approach: Paraspinal approach. Area Prepped: Entire Posterior Thoracic Region Prepping solution: ChloraPrep (2% chlorhexidine gluconate and 70% isopropyl alcohol) Safety Precautions: Aspiration looking for blood return was conducted prior to all injections. At no point did we inject any substances, as a needle was being advanced. No attempts were made at seeking any paresthesias. Safe injection practices and needle disposal techniques used. Medications properly checked for expiration dates. SDV (single dose vial) medications used. Description of the Procedure: Protocol guidelines were followed. The patient was placed in position over the fluoroscopy table. The target area was identified and the area prepped in the usual manner. Skin & deeper tissues infiltrated with local anesthetic. Appropriate amount of time allowed to pass for local anesthetics to take effect. The procedure needles were then advanced to the target area. Proper  needle placement secured. Negative aspiration confirmed. Solution injected in intermittent fashion, asking for systemic symptoms every 0.5cc of injectate. The needles were then removed and the area cleansed, making sure to leave some of the prepping solution back to take advantage of its long term bactericidal properties. Vitals:   04/25/18 1034  04/25/18 1044 04/25/18 1054 04/25/18 1100  BP: (!) 151/90 (!) 148/71 (!) 150/72 (!) 148/80  Pulse:      Resp: 10 20 19 20   Temp:  98.6 F (37 C)  98.1 F (36.7 C)  TempSrc:      SpO2: 100% 95% 96% 98%  Weight:      Height:        Start Time: 1020 hrs. End Time: 1034 hrs. Imaging Guidance (Spinal):          Type of Imaging Technique: Fluoroscopy Guidance (Spinal) Indication(s): Assistance in needle guidance and placement for procedures requiring needle placement in or near specific anatomical locations not easily accessible without such assistance. Exposure Time: Please see nurses notes. Contrast: None used. Fluoroscopic Guidance: I was personally present during the use of fluoroscopy. "Tunnel Vision Technique" used to obtain the best possible view of the target area. Parallax error corrected before commencing the procedure. "Direction-depth-direction" technique used to introduce the needle under continuous pulsed fluoroscopy. Once target was reached, antero-posterior, oblique, and lateral fluoroscopic projection used confirm needle placement in all planes. Images permanently stored in EMR.              Interpretation: No contrast injected. I personally interpreted the imaging intraoperatively. Adequate needle placement confirmed in multiple planes. Permanent images saved into the patient's record.  Antibiotic Prophylaxis:   Anti-infectives (From admission, onward)   None     Indication(s): None identified  Post-operative Assessment:  Post-procedure Vital Signs:  Pulse/HCG Rate: 9470 Temp: 98.1 F (36.7 C) Resp: 20 BP: (!)  148/80 SpO2: 98 %  EBL: None  Complications: No immediate post-treatment complications observed by team, or reported by patient.  Note: The patient tolerated the entire procedure well. A repeat set of vitals were taken after the procedure and the patient was kept under observation following institutional policy, for this type of procedure. Post-procedural neurological assessment was performed, showing return to baseline, prior to discharge. The patient was provided with post-procedure discharge instructions, including a section on how to identify potential problems. Should any problems arise concerning this procedure, the patient was given instructions to immediately contact us, at any time, without hesitation. In any case, we plan to contact the patient by telephone for a follow-up status report regarding this interventional procedure.  Comments:  No additional relevant information.  Plan of Care    Imaging Orders     DG C-Arm 1-60 Min-No Report  Procedure Orders     THORACIC FACET BLOCK  Medications ordered for procedure: Meds ordered this encounter  Medications  . lidocaine (XYLOCAINE) 2 % (with pres) injection 400 mg  . midazolam (VERSED) 5 MG/5ML injection 1-2 mg    Make sure Flumazenil is available in the pyxis when using this medication. If oversedation occurs, administer 0.2 mg IV over 15 sec. If after 45 sec no response, administer 0.2 mg again over 1 min; may repeat at 1 min intervals; not to exceed 4 doses (1 mg)  . fentaNYL (SUBLIMAZE) injection 25-50 mcg    Make sure Narcan is available in the pyxis when using this medication. In the event of respiratory depression (RR< 8/min): Titrate NARCAN (naloxone) in increments of 0.1 to 0.2 mg IV at 2-3 minute intervals, until desired degree of reversal.  . lactated ringers infusion 1,000 mL  . dexamethasone (DECADRON) injection 10 mg  . dexamethasone (DECADRON) injection 10 mg  . ropivacaine (PF) 2 mg/mL (0.2%) (NAROPIN) injection  18 mL   Medications administered: We administered lidocaine,  midazolam, fentaNYL, lactated ringers, dexamethasone, dexamethasone, and ropivacaine (PF) 2 mg/mL (0.2%).  See the medical record for exact dosing, route, and time of administration.  Disposition: Discharge home  Discharge Date & Time: 04/25/2018; 1104 hrs.   Physician-requested Follow-up: Return for post-procedure eval (2 wks), w/ Dr. Dossie Arbour.  Future Appointments  Date Time Provider [place]  05/15/2018 11:15 AM Milinda Pointer, MD [facility] None   Primary Care Physician: Rusty Aus, MD Location: [facility] Note by: Gaspar Cola, MD Date: 04/25/2018; Time: 11:21 AM  Disclaimer:  Medicine is not an Chief Strategy Officer. The only guarantee in medicine is that nothing is guaranteed. It is important to note that the decision to proceed with this intervention was based on the information collected from the patient. The Data and conclusions were drawn from the patient's questionnaire, the interview, and the physical examination. Because the information was provided in large part by the patient, it cannot be guaranteed that it has not been purposely or unconsciously manipulated. Every effort has been made to obtain as much relevant data as possible for this evaluation. It is important to note that the conclusions that lead to this procedure are derived in large part from the available data. Always take into account that the treatment will also be dependent on availability of resources and existing treatment guidelines, considered by other Pain Management Practitioners as being common knowledge and practice, at the time of the intervention. For Medico-Legal purposes, it is also important to point out that variation in procedural techniques and pharmacological choices are the acceptable norm. The indications, contraindications, technique, and results of the above procedure should only be interpreted  and judged by a Board-Certified Interventional Pain Specialist with extensive familiarity and expertise in the same exact procedure and technique.

## 2018-04-26 ENCOUNTER — Telehealth: Payer: Self-pay

## 2018-04-26 NOTE — Telephone Encounter (Signed)
Post procedure phone call.  LM 

## 2018-04-27 MED FILL — KALYDECO 150 MG TABLET: 28 days supply | Qty: 56 | Fill #0 | Status: AC

## 2018-05-08 NOTE — Unmapped (Signed)
Hi Jen,  Looks like you d/c 12/19 due to side effects.  Would you like to retry?  Thanks.

## 2018-05-09 NOTE — Unmapped (Signed)
Thanks

## 2018-05-09 NOTE — Unmapped (Signed)
Noted; discontinued at Dec visit due to side effects. No refills authorized.

## 2018-05-10 ENCOUNTER — Encounter: Payer: Self-pay | Admitting: Internal Medicine

## 2018-05-10 ENCOUNTER — Ambulatory Visit (INDEPENDENT_AMBULATORY_CARE_PROVIDER_SITE_OTHER): Payer: Medicare Other | Admitting: Internal Medicine

## 2018-05-10 VITALS — BP 92/62 | HR 82 | Temp 97.5°F | Ht 65.0 in | Wt 119.2 lb

## 2018-05-10 DIAGNOSIS — R937 Abnormal findings on diagnostic imaging of other parts of musculoskeletal system: Secondary | ICD-10-CM

## 2018-05-10 DIAGNOSIS — J479 Bronchiectasis, uncomplicated: Secondary | ICD-10-CM

## 2018-05-10 DIAGNOSIS — G8929 Other chronic pain: Secondary | ICD-10-CM

## 2018-05-10 DIAGNOSIS — M810 Age-related osteoporosis without current pathological fracture: Secondary | ICD-10-CM | POA: Diagnosis not present

## 2018-05-10 DIAGNOSIS — M549 Dorsalgia, unspecified: Secondary | ICD-10-CM

## 2018-05-10 DIAGNOSIS — M542 Cervicalgia: Secondary | ICD-10-CM

## 2018-05-10 DIAGNOSIS — E785 Hyperlipidemia, unspecified: Secondary | ICD-10-CM

## 2018-05-10 DIAGNOSIS — M546 Pain in thoracic spine: Secondary | ICD-10-CM

## 2018-05-10 DIAGNOSIS — D72829 Elevated white blood cell count, unspecified: Secondary | ICD-10-CM

## 2018-05-10 DIAGNOSIS — E559 Vitamin D deficiency, unspecified: Secondary | ICD-10-CM | POA: Diagnosis not present

## 2018-05-10 DIAGNOSIS — I10 Essential (primary) hypertension: Secondary | ICD-10-CM

## 2018-05-10 DIAGNOSIS — G4700 Insomnia, unspecified: Secondary | ICD-10-CM

## 2018-05-10 DIAGNOSIS — F419 Anxiety disorder, unspecified: Secondary | ICD-10-CM

## 2018-05-10 DIAGNOSIS — R413 Other amnesia: Secondary | ICD-10-CM

## 2018-05-10 MED ORDER — CHOLECALCIFEROL 1.25 MG (50000 UT) PO CAPS: 50000.0000 [IU] | ORAL_CAPSULE | ORAL | 1 refills | Status: AC
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
## 2018-05-10 NOTE — Patient Instructions (Addendum)
Vitamin D take 1x per week;  Start 5000 IU vitamin daily starting month 7 Call to schedule bone density  Calcium 600 mg 2x per day     Osteoporosis  Osteoporosis is thinning and loss of density in your bones. Osteoporosis makes bones more brittle and fragile and more likely to break (fracture). Over time, osteoporosis can cause your bones to become so weak that they fracture after a minor fall. Bones in the hip, wrist, and spine are most likely to fracture due to osteoporosis. What are the causes? The exact cause of this condition is not known. What increases the risk? You may be at greater risk for osteoporosis if you:  Have a family history of the condition.  Have poor nutrition.  Use steroid medicines, such as prednisone.  Are female.  Are age 60 or older.  Smoke or have a history of smoking.  Are not physically active (are sedentary).  Are white (Caucasian) or of Asian descent.  Have a small body frame.  Take certain medicines, such as antiseizure medicines. What are the signs or symptoms? A fracture might be the first sign of osteoporosis, especially if the fracture results from a fall or injury that usually would not cause a bone to break. Other signs and symptoms include:  Pain in the neck or low back.  Stooped posture.  Loss of height. How is this diagnosed? This condition may be diagnosed based on:  Your medical history.  A physical exam.  A bone mineral density test, also called a DXA or DEXA test (dual-energy X-ray absorptiometry test). This test uses X-rays to measure the amount of minerals in your bones. How is this treated? The goal of treatment is to strengthen your bones and lower your risk for a fracture. Treatment may involve:  Making lifestyle changes, such as: ? Including foods with more calcium and vitamin D in your diet. ? Doing weight-bearing and muscle-strengthening exercises. ? Stopping tobacco use. ? Limiting alcohol intake.  Taking  medicine to slow the process of bone loss or to increase bone density.  Taking daily supplements of calcium and vitamin D.  Taking hormone replacement medicines, such as estrogen for women and testosterone for men.  Monitoring your levels of calcium and vitamin D. Follow these instructions at home:  Activity  Exercise as told by your health care provider. Ask your health care provider what exercises and activities are safe for you. You should do: ? Exercises that make you work against gravity (weight-bearing exercises), such as tai chi, yoga, or walking. ? Exercises to strengthen muscles, such as lifting weights. Lifestyle  Limit alcohol intake to no more than 1 drink a day for nonpregnant women and 2 drinks a day for men. One drink equals 12 oz of beer, 5 oz of wine, or 1 oz of hard liquor.  Do not use any products that contain nicotine or tobacco, such as cigarettes and e-cigarettes. If you need help quitting, ask your health care provider. Preventing falls  Use devices to help you move around (mobility aids) as needed, such as canes, walkers, scooters, or crutches.  Keep rooms well-lit and clutter-free.  Remove tripping hazards from walkways, including cords and throw rugs.  Install grab bars in bathrooms and safety rails on stairs.  Use rubber mats in the bathroom and other areas that are often wet or slippery.  Wear closed-toe shoes that fit well and support your feet. Wear shoes that have rubber soles or low heels.  Review your medicines  with your health care provider. Some medicines can cause dizziness or changes in blood pressure, which can increase your risk of falling. General instructions  Include calcium and vitamin D in your diet. Calcium is important for bone health, and vitamin D helps your body to absorb calcium. Good sources of calcium and vitamin D include: ? Certain fatty fish, such as salmon and tuna. ? Products that have calcium and vitamin D added to them  (fortified products), such as fortified cereals. ? Egg yolks. ? Cheese. ? Liver.  Take over-the-counter and prescription medicines only as told by your health care provider.  Keep all follow-up visits as told by your health care provider. This is important. Contact a health care provider if:  You have never been screened for osteoporosis and you are: ? A woman who is age 63 or older. ? A man who is age 25 or older. Get help right away if:  You fall or injure yourself. Summary  Osteoporosis is thinning and loss of density in your bones. This makes bones more brittle and fragile and more likely to break (fracture),even with minor falls.  The goal of treatment is to strengthen your bones and reduce your risk for a fracture.  Include calcium and vitamin D in your diet. Calcium is important for bone health, and vitamin D helps your body to absorb calcium.  Talk with your health care provider about screening for osteoporosis if you are a woman who is age 76 or older, or a man who is age 48 or older. This information is not intended to replace advice given to you by your health care provider. Make sure you discuss any questions you have with your health care provider. Document Released: 12/16/2004 Document Revised: 12/31/2016 Document Reviewed: 12/31/2016 Elsevier Interactive Patient Education  2019 [employer].

## 2018-05-10 NOTE — Progress Notes (Signed)
Pre visit review using our clinic review tool, if applicable. No additional management support is needed unless otherwise documented below in the visit note. 

## 2018-05-11 ENCOUNTER — Encounter: Payer: Self-pay | Admitting: Internal Medicine

## 2018-05-11 ENCOUNTER — Telehealth: Payer: Self-pay | Admitting: Internal Medicine

## 2018-05-11 ENCOUNTER — Other Ambulatory Visit (INDEPENDENT_AMBULATORY_CARE_PROVIDER_SITE_OTHER): Payer: Medicare Other

## 2018-05-11 DIAGNOSIS — E785 Hyperlipidemia, unspecified: Secondary | ICD-10-CM

## 2018-05-11 DIAGNOSIS — D72829 Elevated white blood cell count, unspecified: Secondary | ICD-10-CM

## 2018-05-11 DIAGNOSIS — R413 Other amnesia: Secondary | ICD-10-CM | POA: Insufficient documentation

## 2018-05-11 DIAGNOSIS — M810 Age-related osteoporosis without current pathological fracture: Secondary | ICD-10-CM | POA: Insufficient documentation

## 2018-05-11 DIAGNOSIS — G8929 Other chronic pain: Secondary | ICD-10-CM | POA: Insufficient documentation

## 2018-05-11 DIAGNOSIS — E559 Vitamin D deficiency, unspecified: Secondary | ICD-10-CM | POA: Insufficient documentation

## 2018-05-11 DIAGNOSIS — F419 Anxiety disorder, unspecified: Secondary | ICD-10-CM | POA: Insufficient documentation

## 2018-05-11 DIAGNOSIS — G4701 Insomnia due to medical condition: Secondary | ICD-10-CM | POA: Insufficient documentation

## 2018-05-11 LAB — LIPID PANEL
Cholesterol: 161 mg/dL (ref 0–200)
HDL: 47.7 mg/dL (ref >39.00)
NonHDL: 113.69
Total CHOL/HDL Ratio: 3
Triglycerides: 321 mg/dL — ABNORMAL HIGH (ref 0.0–149.0)
VLDL: 64.2 mg/dL — ABNORMAL HIGH (ref 0.0–40.0)

## 2018-05-11 LAB — CBC WITH DIFFERENTIAL/PLATELET
BASOS PCT: 1.2 % (ref 0.0–3.0)
Basophils Absolute: 0.1 10*3/uL (ref 0.0–0.1)
Eosinophils Absolute: 0.6 10*3/uL (ref 0.0–0.7)
Eosinophils Relative: 6.6 % — ABNORMAL HIGH (ref 0.0–5.0)
HEMATOCRIT: 40.8 % (ref 36.0–46.0)
Hemoglobin: 13.4 g/dL (ref 12.0–15.0)
Lymphocytes Relative: 20.6 % (ref 12.0–46.0)
Lymphs Abs: 1.9 10*3/uL (ref 0.7–4.0)
MCHC: 32.8 g/dL (ref 30.0–36.0)
MCV: 86.6 fl (ref 78.0–100.0)
Monocytes Absolute: 0.6 10*3/uL (ref 0.1–1.0)
Monocytes Relative: 7.1 % (ref 3.0–12.0)
Neutro Abs: 5.8 10*3/uL (ref 1.4–7.7)
Neutrophils Relative %: 64.5 % (ref 43.0–77.0)
Platelets: 290 10*3/uL (ref 150.0–400.0)
RBC: 4.72 Mil/uL (ref 3.87–5.11)
RDW: 15.7 % — ABNORMAL HIGH (ref 11.5–15.5)
WBC: 9 10*3/uL (ref 4.0–10.5)

## 2018-05-11 LAB — LDL CHOLESTEROL, DIRECT: Direct LDL: 86 mg/dL

## 2018-05-11 MED FILL — SODIUM CHLORIDE 10 % FOR NEBULIZATION: 30 days supply | Qty: 750 | Fill #0 | Status: AC

## 2018-05-11 NOTE — Progress Notes (Signed)
Shot not in [employer]

## 2018-05-11 NOTE — Telephone Encounter (Signed)
Call pt's husband she needs to bring in all medications to next visit   She should no longer be taking ambien if on restoril/temazepam  She should be on cymbalta not zoloft if still taking zoloft 100 mg she needs to take 50 mg x 1 week then 25 mg x 1 week then stop  zocor cholesterol medication 80 mg is too high rec cut pill to 40 mg at night    TMS

## 2018-05-12 ENCOUNTER — Encounter: Payer: Self-pay | Admitting: *Deleted

## 2018-05-12 ENCOUNTER — Other Ambulatory Visit: Payer: Self-pay | Admitting: Internal Medicine

## 2018-05-12 DIAGNOSIS — E785 Hyperlipidemia, unspecified: Secondary | ICD-10-CM

## 2018-05-12 MED ORDER — EZETIMIBE 10 MG PO TABS: 10.0000 mg | ORAL_TABLET | Freq: Every day | ORAL | 3 refills | Status: AC

## 2018-05-12 MED ORDER — SIMVASTATIN 40 MG PO TABS: 40.0000 mg | ORAL_TABLET | Freq: Every day | ORAL | 3 refills | Status: AC

## 2018-05-15 ENCOUNTER — Ambulatory Visit: Payer: Medicare Other | Attending: Pain Medicine | Admitting: Pain Medicine

## 2018-05-15 ENCOUNTER — Telehealth: Payer: Self-pay

## 2018-05-15 ENCOUNTER — Other Ambulatory Visit: Payer: Self-pay

## 2018-05-15 ENCOUNTER — Encounter: Payer: Self-pay | Admitting: Pain Medicine

## 2018-05-15 VITALS — BP 103/58 | HR 90 | Temp 97.9°F | Resp 18 | Ht 65.0 in | Wt 119.0 lb

## 2018-05-15 DIAGNOSIS — M25512 Pain in left shoulder: Secondary | ICD-10-CM | POA: Insufficient documentation

## 2018-05-15 DIAGNOSIS — M79602 Pain in left arm: Secondary | ICD-10-CM | POA: Diagnosis present

## 2018-05-15 DIAGNOSIS — M545 Low back pain, unspecified: Secondary | ICD-10-CM

## 2018-05-15 DIAGNOSIS — M546 Pain in thoracic spine: Secondary | ICD-10-CM | POA: Diagnosis present

## 2018-05-15 DIAGNOSIS — M47814 Spondylosis without myelopathy or radiculopathy, thoracic region: Secondary | ICD-10-CM | POA: Diagnosis present

## 2018-05-15 DIAGNOSIS — M47816 Spondylosis without myelopathy or radiculopathy, lumbar region: Secondary | ICD-10-CM | POA: Diagnosis present

## 2018-05-15 DIAGNOSIS — M47894 Other spondylosis, thoracic region: Secondary | ICD-10-CM

## 2018-05-15 DIAGNOSIS — G8929 Other chronic pain: Secondary | ICD-10-CM

## 2018-05-15 MED ORDER — CYPROHEPTADINE 4 MG TABLET
ORAL_TABLET | Freq: Three times a day (TID) | ORAL | 1 refills | 0 days | Status: CP | PRN
Start: 2018-05-15 — End: 2018-08-07

## 2018-05-15 NOTE — Progress Notes (Signed)
Patient's Name: Brenda Clarke  MRN: [id number]  Referring Provider: Rusty Aus, MD  DOB: November 01, 1945  PCP: McLean-Scocuzza, Nino Glow, MD  DOS: 05/15/2018  Note by: Gaspar Cola, MD  Service setting: Ambulatory outpatient  Specialty: Interventional Pain Management  Location: [facility] (AMB) Pain Management Facility    Patient type: Established   Primary Reason(s) for Visit: Encounter for post-procedure evaluation of chronic illness with mild to moderate exacerbation CC: Back Pain (mid and low) and Shoulder Pain (left)  HPI  Ms. Benning is a 73 y.o. year old, female patient, who comes today for a post-procedure evaluation. 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[place]); Cystic fibrosis ([place]); Essential hypertension; Fibrocystic breast disease; Gastro-esophageal reflux disease with esophagitis; Heartburn; Hyperlipidemia; Laceration of finger; Low serum vitamin D; Migraine without status migrainosus, not intractable; Monocular diplopia; DDD (degenerative disc disease), cervical; Chronic neck pain; Pain in finger; Prolapse of anterior vaginal wall; Recurrent major depressive disorder, in partial remission ([place]); Chronic low back pain [facility] Area of Pain) (Right); Chronic tendinitis of rotator cuff (Left); S/P cataract extraction and insertion of intraocular lens, left; Screen for colon cancer; Spondylosis of cervical region without myelopathy or radiculopathy; Tubular adenoma; Uterovaginal prolapse; Chronic shoulder pain (Primary Area of Pain) (Left); Chronic upper extremity pain (Secondary Area of Pain) (Left); Chronic pain syndrome; Long term current use of opiate analgesic; Pharmacologic therapy; Disorder of skeletal system; Problems influencing health status; Cervicalgia; Traumatic tear of rotator cuff,  sequela (Left); Supraspinatus tendonitis (Left); Infraspinatus tendonitis (Left); Osteoarthritis of AC (acromioclavicular) joint (Left); Subacromial bursitis (Left); Subdeltoid bursitis (Left); Effusion of glenohumeral joint (Left); History of traumatic closed nondisplaced fracture of greater tuberosity of humerus (Left); Chronic upper back pain (Bilateral); Pain of rhomboid muscle; Abnormal x-ray of cervical spine; Cervical Grade 1 Anterolisthesis of C4/C5 & C5/C6.; Cervical facet arthropathy (Bilateral); Cervical facet syndrome (Bilateral); 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); Osteoarthritis of cervical spine; Left humeral fracture; Medicare annual wellness visit, initial; Abnormal MRI, cervical spine (03/24/2018); Thoracic facet syndrome (Bilateral); Osteoarthritis involving multiple joints; Neurogenic pain; Muscle pain, fibromyalgia; Allodynia; Chronic thoracic back pain (Bilateral); Spondylosis without myelopathy or radiculopathy, thoracic region; Vitamin D deficiency; Osteoporosis; Anxiety; Memory loss; Insomnia; Lumbar facet syndrome (Bilateral); Spondylosis without myelopathy or radiculopathy, lumbar region; DDD (degenerative disc disease), lumbar; Lumbar facet hypertrophy (Multilevel) (Bilateral); and Osteoarthritis of facet joint of lumbar spine on their problem list. Her primarily concern today is the Back Pain (mid and low) and Shoulder Pain (left)  Pain Assessment: Location: Mid, Lower Back Radiating: denies Onset: More than a month ago Duration: Chronic pain Quality: Sharp, Dull, Constant Severity: 5 /10 (subjective, self-reported pain score)  Note: Reported level is inconsistent with clinical observations. Clinically the patient looks like a 3/10 A 3/10 is viewed as "Moderate" and described as significantly interfering with activities of daily living (ADL). It becomes difficult to feed, bathe, get dressed, get on and off the toilet or to perform personal hygiene functions.  Difficult to get in and out of bed or a chair without assistance. Very distracting. With effort, it can be ignored when deeply involved in activities. Ms. Holck does not seem to understand the use of our objective pain scale When using our objective Pain Scale, levels between 6 and 10/10 are said to belong in an emergency room, as it progressively worsens from a 6/10, described as severely limiting, requiring emergency  care not usually available at an outpatient pain management facility. At a 6/10 level, communication becomes difficult and requires great effort. Assistance to reach the emergency department may be required. Facial flushing and profuse sweating along with potentially dangerous increases in heart rate and blood pressure will be evident. Timing: Constant Modifying factors: heat, procedures BP: (!) 103/58  HR: 90  Ms. Fontenette comes in today for post-procedure evaluation.  Today I had to call on the patient's attention since she wondered outside of her exam room checking on the other patients room to find my location.  Apparently she was dissatisfied with having to wait.  She claims that she was looking for the front desk and that she got turnaround.  I know this not to be accurate since once she checked up on me, she went back to her exam room and never went to the front desk as she claimed she was intending to do.  Unfortunately, rather than accepting what she was doing she made an issue about this and claimed to be offended by my statement.  To pacify things, I went ahead and told her that if I had missed read the situation, I was deeply sorry and I apologized for it.  However, I feel that my initial impression on the situation was accurate.  In any case, as always, it was very difficult to obtain any useful information from this patient as she continues to be vague in terms of the location of her symptoms and she indicates that she hurts all over.  When asked about the results of the recent  procedure, she indicated that it did not work however, this had more to do with her expectations anything else since despite the fact that I had clearly informed her that this would be a diagnostic procedure, she chose to not process this information.  She told me that she was expecting this to be a treatment for her pain.  Once again, I explained to her that every procedure that we do is both a diagnostic and a therapeutic intervention.  However, to [place] it is more important to get clear information as to what happens during the time that the local anesthetic is in full effect.  Despite the fact that she indicated that it did not help, she would then point at the area that we had not treated.  So once again I had to repeat to the patient that I needed information from the area that we had treated and that for the purpose of the diagnostic portion of the exam she needed to forget a untreated area and essentially concentrated on the area that we had treated so as to let us know if for the duration of the local anesthetic she had attained good relief.  Unfortunately, this went on and on as it is usually the case with her.  As I was getting information out of her, it was clear to me that she was actually contradicting herself as she would admit that the treated area was actually better but she kept insisting that she had pain in the lower and midportion of her back, which we had not treated.  Her continued made it incredibly difficult to pull useful information out of her and significantly prolonged the visit.  In any case, the sum total of the evaluation was that we needed to further work on her mid and lower back pain.  Because of this I have scheduled the patient to return for a diagnostic bilateral thoracolumbar  facet blocks under fluoroscopic guidance and IV sedation.  Further details on both, my assessment(s), as well as the proposed treatment plan, please see below.  Post-Procedure Assessment  04/25/2018  Procedure: Diagnostic bilateral thoracic facet block #1 (T2, T3, T4, T5, T6, & T7  Medial Branch) under fluoroscopic guidance and IV sedation  Pre-procedure pain score:  5/10 Post-procedure pain score: 0/10 (100% relief) Influential Factors: BMI: 19.80 kg/m Intra-procedural challenges: None observed.         Assessment challenges: None detected.              Reported side-effects: None.        Post-procedural adverse reactions or complications: None reported         Sedation: Sedation provided. When no sedatives are used, the analgesic levels obtained are directly associated to the effectiveness of the local anesthetics. However, when sedation is provided, the level of analgesia obtained during the initial 1 hour following the intervention, is believed to be the result of a combination of factors. These factors may include, but are not limited to: 1. The effectiveness of the local anesthetics used. 2. The effects of the analgesic(s) and/or anxiolytic(s) used. 3. The degree of discomfort experienced by the patient at the time of the procedure. 4. The patients ability and reliability in recalling and recording the events. 5. The presence and influence of possible secondary gains and/or psychosocial factors. Reported result: Relief experienced during the 1st hour after the procedure: 100 % (Ultra-Short Term Relief)            Interpretative annotation: Clinically appropriate result. Analgesia during this period is likely to be Local Anesthetic and/or IV Sedative (Analgesic/Anxiolytic) related.          Effects of local anesthetic: The analgesic effects attained during this period are directly associated to the localized infiltration of local anesthetics and therefore cary significant diagnostic value as to the etiological location, or anatomical origin, of the pain. Expected duration of relief is directly dependent on the pharmacodynamics of the local anesthetic used. Long-acting (4-6 hours)  anesthetics used.  Reported result: Relief during the next 4 to 6 hour after the procedure: 100 % (Short-Term Relief)            Interpretative annotation: Clinically appropriate result. Analgesia during this period is likely to be Local Anesthetic-related.          Long-term benefit: Defined as the period of time past the expected duration of local anesthetics (1 hour for short-acting and 4-6 hours for long-acting). With the possible exception of prolonged sympathetic blockade from the local anesthetics, benefits during this period are typically attributed to, or associated with, other factors such as analgesic sensory neuropraxia, antiinflammatory effects, or beneficial biochemical changes provided by agents other than the local anesthetics.  Reported result: Extended relief following procedure: 50 % (Long-Term Relief)            Interpretative annotation: Clinically possible results. Good relief. No permanent benefit expected. Inflammation plays a part in the etiology to the pain.          Current benefits: Defined as reported results that persistent at this point in time.   Analgesia: 50-75 %            Function: Somewhat improved ROM: Somewhat improved Interpretative annotation: Recurrence of symptoms. No permanent benefit expected. Effective diagnostic intervention.          Interpretation: Results would suggest a successful diagnostic intervention.  Plan:  Please see "Plan of Care" for details.                Laboratory Chemistry  Inflammation Markers (CRP: Acute Phase) (ESR: Chronic Phase) Lab Results  Component Value Date   CRP 3 12/01/2017   ESRSEDRATE 5 12/01/2017                         Renal Markers Lab Results  Component Value Date   BUN 20 11/17/2017   CREATININE 0.86 11/17/2017   GFRAA >60 11/17/2017   GFRNONAA >60 11/17/2017                             Hepatic Markers Lab Results  Component Value Date   AST 27 11/17/2017   ALT 16 11/17/2017    ALBUMIN 4.2 11/17/2017                        Note: Lab results reviewed.  Recent Imaging Results   Results for orders placed in visit on 04/25/18  DG C-Arm 1-60 Min-No Report   Narrative Fluoroscopy was utilized by the requesting physician.  No radiographic  interpretation.    Interpretation Report: Fluoroscopy was used during the procedure to assist with needle guidance. The images were interpreted intraoperatively by the requesting physician.  Meds   Current Outpatient Medications:  .  Calcium Citrate 333 MG TABS, Take 2 capsules by mouth daily. , Disp: , Rfl:  .  Cholecalciferol 1.25 MG (50000 UT) capsule, Take 1 capsule (50,000 Units total) by mouth once a week., Disp: 13 capsule, Rfl: 1 .  DULoxetine (CYMBALTA) 30 MG capsule, Take 30 mg by mouth daily., Disp: , Rfl:  .  enalapril (VASOTEC) 5 MG tablet, Take 2.5 mg by mouth daily., Disp: , Rfl:  .  Estradiol 0.5 MG/0.5GM GEL, Place onto the skin. 2x per week Dr. Salome Holmes, Disp: , Rfl:  .  ezetimibe (ZETIA) 10 MG tablet, Take 1 tablet (10 mg total) by mouth daily., Disp: 90 tablet, Rfl: 3 .  gabapentin (NEURONTIN) 100 MG capsule, Take 100 mg by mouth 4 (four) times daily., Disp: , Rfl:  .  Ivacaftor ([name]) 150 MG TABS, Take 150 mg by mouth 2 (two) times daily., Disp: , Rfl:  .  metoprolol succinate (TOPROL-XL) 25 MG 24 hr tablet, Take 1 tablet by mouth 2 (two) times daily., Disp: , Rfl:  .  Multiple Vitamins-Minerals (WOMENS MULTIVITAMIN PO), Take 1 tablet by mouth daily., Disp: , Rfl:  .  Omega-3 Fatty Acids (FISH OIL) 1200 MG CAPS, Take 1 capsule by mouth daily., Disp: , Rfl:  .  omeprazole (PRILOSEC) 40 MG capsule, Take 40 mg by mouth daily., Disp: , Rfl:  .  sertraline (ZOLOFT) 100 MG tablet, Take 100 mg by mouth at bedtime., Disp: , Rfl:  .  simvastatin (ZOCOR) 40 MG tablet, Take 1 tablet (40 mg total) by mouth daily at 6 PM., Disp: 90 tablet, Rfl: 3 .  Sodium Chloride 10 % NEBU, Inhale 1 Dose into the lungs as needed.,  Disp: , Rfl:  .  temazepam (RESTORIL) 15 MG capsule, Take 15 mg by mouth at bedtime as needed for sleep., Disp: , Rfl:  .  tiZANidine (ZANAFLEX) 4 MG tablet, Take 4 mg by mouth every 8 (eight) hours as needed for muscle spasms., Disp: , Rfl:  .  amitriptyline (ELAVIL) 10 MG tablet, Take  1 tablet (10 mg total) by mouth at bedtime., Disp: 30 tablet, Rfl: 5 .  galantamine (RAZADYNE) 4 MG tablet, Take 4 mg by mouth every morning., Disp: , Rfl:  .  meloxicam (MOBIC) 15 MG tablet, Take 1 tablet (15 mg total) by mouth daily., Disp: 30 tablet, Rfl: 5  ROS  Constitutional: Denies any fever or chills Gastrointestinal: No reported hemesis, hematochezia, vomiting, or acute GI distress Musculoskeletal: Denies any acute onset joint swelling, redness, loss of ROM, or weakness Neurological: No reported episodes of acute onset apraxia, aphasia, dysarthria, agnosia, amnesia, paralysis, loss of coordination, or loss of consciousness  Allergies  Ms. Pino is allergic to tobramycin; codeine; galantamine; and hydrocodone.  PFSH  Drug: Ms. Authier  reports no history of drug use. Alcohol:  reports previous alcohol use. Tobacco:  reports that she has never smoked. She has never used smokeless tobacco. Medical:  has a past medical history of Allergy, Bronchiectasis ([place]), Cystic fibrosis ([place]), Depression, Duodenal ulceration, GERD (gastroesophageal reflux disease), History of chicken pox, History of hiatal hernia, migraines, Hypercholesteremia, Hypertension, and Pap smear abnormality of cervix with ASCUS favoring benign. Surgical: Ms. Washinton  has a past surgical history that includes Colonoscopy; Bronchoscopy; Nasal sinus surgery; Tonsillectomy; Eye surgery; Cataract extraction w/ intraocular lens  implant, bilateral (Bilateral); and Shoulder arthroscopy with open rotator cuff repair (Left, 09/15/2017). Family: family history includes Alzheimer's disease in her mother; Cancer in her maternal grandmother; Stroke in her  mother.  Constitutional Exam  General appearance: Well nourished, well developed, and well hydrated. In no apparent acute distress Vitals:   05/15/18 1121  BP: (!) 103/58  Pulse: 90  Resp: 18  Temp: 97.9 F (36.6 C)  TempSrc: Oral  SpO2: 100%  Weight: 119 lb (54 kg)  Height: '5\' 5"'  (1.651 m)   BMI Assessment: Estimated body mass index is 19.8 kg/m as calculated from the following:   Height as of this encounter: '5\' 5"'  (1.651 m).   Weight as of this encounter: 119 lb (54 kg).  BMI interpretation table: BMI level Category Range association with higher incidence of chronic pain  <18 kg/m2 Underweight   18.5-24.9 kg/m2 Ideal body weight   25-29.9 kg/m2 Overweight Increased incidence by 20%  30-34.9 kg/m2 Obese (Class I) Increased incidence by 68%  35-39.9 kg/m2 Severe obesity (Class II) Increased incidence by 136%  >40 kg/m2 Extreme obesity (Class III) Increased incidence by 254%   Patient's current BMI Ideal Body weight  Body mass index is 19.8 kg/m. Ideal body weight: 57 kg (125 lb 10.6 oz)   BMI Readings from Last 4 Encounters:  05/16/18 19.80 kg/m  05/15/18 19.80 kg/m  05/10/18 19.84 kg/m  04/25/18 19.47 kg/m   Wt Readings from Last 4 Encounters:  05/16/18 119 lb (54 kg)  05/15/18 119 lb (54 kg)  05/10/18 119 lb 3.2 oz (54.1 kg)  04/25/18 117 lb (53.1 kg)  Psych/Mental status: Alert, oriented x 3 (person, place, & time)       Eyes: PERLA Respiratory: No evidence of acute respiratory distress  Cervical Spine Area Exam  Skin & Axial Inspection: No masses, redness, edema, swelling, or associated skin lesions Alignment: Symmetrical Functional ROM: Unrestricted ROM      Stability: No instability detected Muscle Tone/Strength: Functionally intact. No obvious neuro-muscular anomalies detected. Sensory (Neurological): Unimpaired Palpation: No palpable anomalies              Upper Extremity (UE) Exam    Side: Right upper extremity  Side: Left upper extremity  Skin  & Extremity Inspection: Skin color, temperature, and hair growth are WNL. No peripheral edema or cyanosis. No masses, redness, swelling, asymmetry, or associated skin lesions. No contractures.  Skin & Extremity Inspection: Skin color, temperature, and hair growth are WNL. No peripheral edema or cyanosis. No masses, redness, swelling, asymmetry, or associated skin lesions. No contractures.  Functional ROM: Unrestricted ROM          Functional ROM: Unrestricted ROM          Muscle Tone/Strength: Functionally intact. No obvious neuro-muscular anomalies detected.  Muscle Tone/Strength: Functionally intact. No obvious neuro-muscular anomalies detected.  Sensory (Neurological): Unimpaired          Sensory (Neurological): Unimpaired          Palpation: No palpable anomalies              Palpation: No palpable anomalies              Provocative Test(s):  Phalen's test: deferred Tinel's test: deferred Apley's scratch test (touch opposite shoulder):  Action 1 (Across chest): deferred Action 2 (Overhead): deferred Action 3 (LB reach): deferred   Provocative Test(s):  Phalen's test: deferred Tinel's test: deferred Apley's scratch test (touch opposite shoulder):  Action 1 (Across chest): deferred Action 2 (Overhead): deferred Action 3 (LB reach): deferred    Thoracic Spine Area Exam  Skin & Axial Inspection: No masses, redness, or swelling Alignment: Symmetrical Functional ROM: Unrestricted ROM Stability: No instability detected Muscle Tone/Strength: Functionally intact. No obvious neuro-muscular anomalies detected. Sensory (Neurological): Unimpaired Muscle strength & Tone: No palpable anomalies  Lumbar Spine Area Exam  Skin & Axial Inspection: No masses, redness, or swelling Alignment: Symmetrical Functional ROM: Unrestricted ROM       Stability: No instability detected Muscle Tone/Strength: Functionally intact. No obvious neuro-muscular anomalies detected. Sensory (Neurological):  Unimpaired Palpation: No palpable anomalies       Provocative Tests: Hyperextension/rotation test: deferred today       Lumbar quadrant test (Kemp's test): deferred today       Lateral bending test: deferred today       Patrick's Maneuver: deferred today                   FABER* test: deferred today                   S-I anterior distraction/compression test: deferred today         S-I lateral compression test: deferred today         S-I Thigh-thrust test: deferred today         S-I Gaenslen's test: deferred today         *(Flexion, ABduction and External Rotation)  Gait & Posture Assessment  Ambulation: Unassisted Gait: Relatively normal for age and body habitus Posture: WNL   Lower Extremity Exam    Side: Right lower extremity  Side: Left lower extremity  Stability: No instability observed          Stability: No instability observed          Skin & Extremity Inspection: Skin color, temperature, and hair growth are WNL. No peripheral edema or cyanosis. No masses, redness, swelling, asymmetry, or associated skin lesions. No contractures.  Skin & Extremity Inspection: Skin color, temperature, and hair growth are WNL. No peripheral edema or cyanosis. No masses, redness, swelling, asymmetry, or associated skin lesions. No contractures.  Functional ROM: Unrestricted ROM  Functional ROM: Unrestricted ROM                  Muscle Tone/Strength: Functionally intact. No obvious neuro-muscular anomalies detected.  Muscle Tone/Strength: Functionally intact. No obvious neuro-muscular anomalies detected.  Sensory (Neurological): Unimpaired        Sensory (Neurological): Unimpaired        DTR: Patellar: deferred today Achilles: deferred today Plantar: deferred today  DTR: Patellar: deferred today Achilles: deferred today Plantar: deferred today  Palpation: No palpable anomalies  Palpation: No palpable anomalies   Assessment   Status Diagnosis   Controlled Controlled Controlled 1. Chronic thoracic back pain (Bilateral)   2. Thoracic facet syndrome (Bilateral)   3. Chronic shoulder pain (Primary Area of Pain) (Left)   4. Chronic upper extremity pain (Secondary Area of Pain) (Left)   5. Chronic low back pain [facility] Area of Pain) (Right)   6. Lumbar facet syndrome      Updated Problems: No problems updated.  Plan of Care  Pharmacotherapy (Medications Ordered): No orders of the defined types were placed in this encounter.  Medications administered today: Connye Burkitt. Laday had no medications administered during this visit.   Procedure Orders     LUMBAR FACET(MEDIAL BRANCH NERVE BLOCK) [facility] Lab Orders  No laboratory test(s) ordered today   Imaging Orders  No imaging studies ordered today   Referral Orders  No referral(s) requested today   Interventional management options: Planned, scheduled, and/or pending:   Diagnostic bilateral thoracolumbar facet block #1 under fluoroscopic guidance and IV sedation   Considering:   Diagnostic (Midline) cervicalESI Diagnostic bilateral cervical facet blocks Possible bilateral cervical facet RFA Diagnostic left supraspinatus tendon sheath injection Diagnostic left infraspinatus tendon sheath injection Diagnostic left subacromial bursa injection Diagnostic left subdeltoid bursa injection Diagnostic left acromioclavicular joint injection Diagnostic left suprascapular nerve block Possible left suprascapularRFA Diagnostic bilateral lumbar facet block Possible bilateral lumbar facet RFA   Palliative PRN treatment(s):   None at this time   Provider-requested follow-up: Return for Procedure (w/ sedation): (B) L-FCT BLK #1.  Future Appointments  Date Time Provider [place]  05/31/2018 11:15 AM Milinda Pointer, MD [facility] None  08/15/2018  2:00 PM McLean-Scocuzza, Nino Glow, MD [facility]   Primary Care Physician: McLean-Scocuzza, Nino Glow,  MD Location: [facility] Facility Note by: Gaspar Cola, MD Date: 05/15/2018; Time: 12:25 PM

## 2018-05-15 NOTE — Telephone Encounter (Signed)
Spoken to patient. Please see results note

## 2018-05-15 NOTE — Progress Notes (Signed)
Safety precautions to be maintained throughout the outpatient stay will include: orient to surroundings, keep bed in low position, maintain call bell within reach at all times, provide assistance with transfer out of bed and ambulation.  

## 2018-05-15 NOTE — Telephone Encounter (Signed)
Copied from [place] [phone]. Topic: Quick Communication - See Telephone Encounter >> May 15, 2018  9:53 AM Babs Bertin, CMA wrote: CRM for notification. See Telephone encounter for: 05/15/18. >> May 15, 2018 10:34 AM Lennox Solders wrote: Pt is returning brook call

## 2018-05-15 NOTE — Telephone Encounter (Signed)
Left message for patient to return call back. PEC may give information.  

## 2018-05-15 NOTE — Patient Instructions (Addendum)
____________________________________________________________________________________________  Preparing for Procedure with Sedation  Instructions: . Oral Intake: Do not eat or drink anything for at least 8 hours prior to your procedure. . Transportation: Public transportation is not allowed. Bring an adult driver. The driver must be physically present in our waiting room before any procedure can be started. . Physical Assistance: Bring an adult physically capable of assisting you, in the event you need help. This adult should keep you company at home for at least 6 hours after the procedure. . Blood Pressure Medicine: Take your blood pressure medicine with a sip of water the morning of the procedure. . Blood thinners: Notify our staff if you are taking any blood thinners. Depending on which one you take, there will be specific instructions on how and when to stop it. . Diabetics on insulin: Notify the staff so that you can be scheduled 1st case in the morning. If your diabetes requires high dose insulin, take only  of your normal insulin dose the morning of the procedure and notify the staff that you have done so. . Preventing infections: Shower with an antibacterial soap the morning of your procedure. . Build-up your immune system: Take 1000 mg of Vitamin C with every meal (3 times a day) the day prior to your procedure. . Antibiotics: Inform the staff if you have a condition or reason that requires you to take antibiotics before dental procedures. . Pregnancy: If you are pregnant, call and cancel the procedure. . Sickness: If you have a cold, fever, or any active infections, call and cancel the procedure. . Arrival: You must be in the facility at least 30 minutes prior to your scheduled procedure. . Children: Do not bring children with you. . Dress appropriately: Bring dark clothing that you would not mind if they get stained. . Valuables: Do not bring any jewelry or valuables.  Procedure  appointments are reserved for interventional treatments only. . No Prescription Refills. . No medication changes will be discussed during procedure appointments. . No disability issues will be discussed.  Reasons to call and reschedule or cancel your procedure: (Following these recommendations will minimize the risk of a serious complication.) . Surgeries: Avoid having procedures within 2 weeks of any surgery. (Avoid for 2 weeks before or after any surgery). . Flu Shots: Avoid having procedures within 2 weeks of a flu shots or . (Avoid for 2 weeks before or after immunizations). . Barium: Avoid having a procedure within 7-10 days after having had a radiological study involving the use of radiological contrast. (Myelograms, Barium swallow or enema study). . Heart attacks: Avoid any elective procedures or surgeries for the initial 6 months after a "Myocardial Infarction" (Heart Attack). . Blood thinners: It is imperative that you stop these medications before procedures. Let us know if you if you take any blood thinner.  . Infection: Avoid procedures during or within two weeks of an infection (including chest colds or gastrointestinal problems). Symptoms associated with infections include: Localized redness, fever, chills, night sweats or profuse sweating, burning sensation when voiding, cough, congestion, stuffiness, runny nose, sore throat, diarrhea, nausea, vomiting, cold or Flu symptoms, recent or current infections. It is specially important if the infection is over the area that we intend to treat. . Heart and lung problems: Symptoms that may suggest an active cardiopulmonary problem include: cough, chest pain, breathing difficulties or shortness of breath, dizziness, ankle swelling, uncontrolled high or unusually low blood pressure, and/or palpitations. If you are experiencing any of these symptoms, cancel   your procedure and contact your primary care physician for an evaluation.  Remember:   Regular Business hours are:  Monday to Thursday 8:00 AM to 4:00 PM  Provider's Schedule: Milinda Pointer, MD:  Procedure days: Tuesday and Thursday 7:30 AM to 4:00 PM  Gillis Santa, MD:  Procedure days: Monday and Wednesday 7:30 AM to 4:00 PM ____________________________________________________________________________________________   Pain Management Discharge Instructions  General Discharge Instructions :  If you need to reach your doctor call: Monday-Friday 8:00 am - 4:00 pm at [phone] or toll free [phone].  After clinic hours [phone] to have operator reach doctor.  Bring all of your medication bottles to all your appointments in the pain clinic.  To cancel or reschedule your appointment with Pain Management please remember to call 24 hours in advance to avoid a fee.  Refer to the educational materials which you have been given on: General Risks, I had my Procedure. Discharge Instructions, Post Sedation.  Post Procedure Instructions:  The drugs you were given will stay in your system until tomorrow, so for the next 24 hours you should not drive, make any legal decisions or drink any alcoholic beverages.  You may eat anything you prefer, but it is better to start with liquids then soups and crackers, and gradually work up to solid foods.  Please notify your doctor immediately if you have any unusual bleeding, trouble breathing or pain that is not related to your normal pain.  Depending on the type of procedure that was done, some parts of your body may feel week and/or numb.  This usually clears up by tonight or the next day.  Walk with the use of an assistive device or accompanied by an adult for the 24 hours.  You may use ice on the affected area for the first 24 hours.  Put ice in a Ziploc bag and cover with a towel and place against area 15 minutes on 15 minutes off.  You may switch to heat after 24 hours.Facet Blocks Patient  Information  Description: The facets are joints in the spine between the vertebrae.  Like any joints in the body, facets can become irritated and painful.  Arthritis can also effect the facets.  By injecting steroids and local anesthetic in and around these joints, we can temporarily block the nerve supply to them.  Steroids act directly on irritated nerves and tissues to reduce selling and inflammation which often leads to decreased pain.  Facet blocks may be done anywhere along the spine from the neck to the low back depending upon the location of your pain.   After numbing the skin with local anesthetic (like Novocaine), a small needle is passed onto the facet joints under x-ray guidance.  You may experience a sensation of pressure while this is being done.  The entire block usually lasts about 15-25 minutes.   Conditions which may be treated by facet blocks:   Low back/buttock pain  Neck/shoulder pain  Certain types of headaches  Preparation for the injection:  1. Do not eat any solid food or dairy products within 8 hours of your appointment. 2. You may drink clear liquid up to 3 hours before appointment.  Clear liquids include water, black coffee, juice or soda.  No milk or cream please. 3. You may take your regular medication, including pain medications, with a sip of water before your appointment.  Diabetics should hold regular insulin (if taken separately) and take 1/2 normal NPH dose the morning of the procedure.  Carry some sugar containing items with you to your appointment. 4. A driver must accompany you and be prepared to drive you home after your procedure. 5. Bring all your current medications with you. 6. An IV may be inserted and sedation may be given at the discretion of the physician. 7. A blood pressure cuff, EKG and other monitors will often be applied during the procedure.  Some patients may need to have extra oxygen administered for a short period. 8. You will be asked  to provide medical information, including your allergies and medications, prior to the procedure.  We must know immediately if you are taking blood thinners (like Coumadin/Warfarin) or if you are allergic to IV iodine contrast (dye).  We must know if you could possible be pregnant.  Possible side-effects:   Bleeding from needle site  Infection (rare, may require surgery)  Nerve injury (rare)  Numbness & tingling (temporary)  Difficulty urinating (rare, temporary)  Spinal headache (a headache worse with upright posture)  Light-headedness (temporary)  Pain at injection site (serveral days)  Decreased blood pressure (rare, temporary)  Weakness in arm/leg (temporary)  Pressure sensation in back/neck (temporary)   Call if you experience:   Fever/chills associated with headache or increased back/neck pain  Headache worsened by an upright position  New onset, weakness or numbness of an extremity below the injection site  Hives or difficulty breathing (go to the emergency room)  Inflammation or drainage at the injection site(s)  Severe back/neck pain greater than usual  New symptoms which are concerning to you  Please note:  Although the local anesthetic injected can often make your back or neck feel good for several hours after the injection, the pain will likely return. It takes 3-7 days for steroids to work.  You may not notice any pain relief for at least one week.  If effective, we will often do a series of 2-3 injections spaced 3-6 weeks apart to maximally decrease your pain.  After the initial series, you may be a candidate for a more permanent nerve block of the facets.  If you have any questions, please call #[phone]) [facility]

## 2018-05-16 ENCOUNTER — Ambulatory Visit
Admission: RE | Admit: 2018-05-16 | Discharge: 2018-05-16 | Disposition: A | Discharge status: Home / Self Care | Payer: Medicare Other | Source: Ambulatory Visit | Attending: Pain Medicine | Admitting: Pain Medicine

## 2018-05-16 ENCOUNTER — Other Ambulatory Visit: Payer: Self-pay

## 2018-05-16 ENCOUNTER — Ambulatory Visit (HOSPITAL_BASED_OUTPATIENT_CLINIC_OR_DEPARTMENT_OTHER): Payer: Medicare Other | Admitting: Pain Medicine

## 2018-05-16 ENCOUNTER — Encounter: Payer: Self-pay | Admitting: Pain Medicine

## 2018-05-16 VITALS — BP 110/64 | HR 80 | Temp 98.4°F | Resp 20 | Ht 65.0 in | Wt 119.0 lb

## 2018-05-16 DIAGNOSIS — M545 Low back pain, unspecified: Secondary | ICD-10-CM

## 2018-05-16 DIAGNOSIS — G8929 Other chronic pain: Secondary | ICD-10-CM | POA: Insufficient documentation

## 2018-05-16 DIAGNOSIS — M47814 Spondylosis without myelopathy or radiculopathy, thoracic region: Secondary | ICD-10-CM | POA: Diagnosis present

## 2018-05-16 DIAGNOSIS — M5136 Other intervertebral disc degeneration, lumbar region: Secondary | ICD-10-CM | POA: Diagnosis present

## 2018-05-16 DIAGNOSIS — M47894 Other spondylosis, thoracic region: Secondary | ICD-10-CM

## 2018-05-16 DIAGNOSIS — M47816 Spondylosis without myelopathy or radiculopathy, lumbar region: Secondary | ICD-10-CM | POA: Insufficient documentation

## 2018-05-16 DIAGNOSIS — M150 Primary generalized (osteo)arthritis: Secondary | ICD-10-CM | POA: Insufficient documentation

## 2018-05-16 DIAGNOSIS — M792 Neuralgia and neuritis, unspecified: Secondary | ICD-10-CM | POA: Insufficient documentation

## 2018-05-16 DIAGNOSIS — M159 Polyosteoarthritis, unspecified: Secondary | ICD-10-CM

## 2018-05-16 MED ORDER — LIDOCAINE HCL 2 % IJ SOLN
20.0000 mL | Freq: Once | INTRAMUSCULAR | Status: AC
  Administered 2018-05-16: 400 mg
  Filled 2018-05-16: qty 40

## 2018-05-16 MED ORDER — ROPIVACAINE HCL 2 MG/ML IJ SOLN
18.0000 mL | Freq: Once | INTRAMUSCULAR | Status: AC
  Administered 2018-05-16: 18 mL via PERINEURAL
  Filled 2018-05-16: qty 20

## 2018-05-16 MED ORDER — MIDAZOLAM HCL 5 MG/5ML IJ SOLN
1.0000 mg | INTRAMUSCULAR | Status: DC | PRN
  Administered 2018-05-16: 2 mg via INTRAVENOUS
  Filled 2018-05-16: qty 5

## 2018-05-16 MED ORDER — AMITRIPTYLINE HCL 10 MG PO TABS: 10.0000 mg | ORAL_TABLET | Freq: Every day | ORAL | 5 refills | Status: DC

## 2018-05-16 MED ORDER — LACTATED RINGERS IV SOLN
1000.0000 mL | Freq: Once | INTRAVENOUS | Status: AC
  Administered 2018-05-16: 1000 mL via INTRAVENOUS

## 2018-05-16 MED ORDER — TRIAMCINOLONE ACETONIDE 40 MG/ML IJ SUSP
80.0000 mg | Freq: Once | INTRAMUSCULAR | Status: AC
  Administered 2018-05-16: 80 mg
  Filled 2018-05-16: qty 2

## 2018-05-16 MED ORDER — MELOXICAM 15 MG PO TABS: 15.0000 mg | ORAL_TABLET | Freq: Every day | ORAL | 5 refills | Status: DC

## 2018-05-16 MED ORDER — FENTANYL CITRATE (PF) 100 MCG/2ML IJ SOLN
25.0000 ug | INTRAMUSCULAR | Status: DC | PRN
  Administered 2018-05-16: 50 ug via INTRAVENOUS
  Filled 2018-05-16: qty 2

## 2018-05-16 NOTE — Patient Instructions (Addendum)
____________________________________________________________________________________________  Post-Procedure Discharge Instructions  Instructions:  Apply ice: Fill a plastic sandwich bag with crushed ice. Cover it with a small towel and apply to injection site. Apply for 15 minutes then remove x 15 minutes. Repeat sequence on day of procedure, until you go to bed. The purpose is to minimize swelling and discomfort after procedure.  Apply heat: Apply heat to procedure site starting the day following the procedure. The purpose is to treat any soreness and discomfort from the procedure.  Food intake: Start with clear liquids (like water) and advance to regular food, as tolerated.   Physical activities: Keep activities to a minimum for the first 8 hours after the procedure.   Driving: If you have received any sedation, you are not allowed to drive for 24 hours after your procedure.  Blood thinner: Restart your blood thinner 6 hours after your procedure. (Only for those taking blood thinners)  Insulin: As soon as you can eat, you may resume your normal dosing schedule. (Only for those taking insulin)  Infection prevention: Keep procedure site clean and dry.  Post-procedure Pain Diary: Extremely important that this be done correctly and accurately. Recorded information will be used to determine the next step in treatment.  Pain evaluated is that of treated area only. Do not include pain from an untreated area.  Complete every hour, on the hour, for the initial 8 hours. Set an alarm to help you do this part accurately.  Do not go to sleep and have it completed later. It will not be accurate.  Follow-up appointment: Keep your follow-up appointment after the procedure. Usually 2 weeks for most procedures. (6 weeks in the case of radiofrequency.) Bring you pain diary.   Expect:  From numbing medicine (AKA: Local Anesthetics): Numbness or decrease in pain.  Onset: Full effect within 15  minutes of injected.  Duration: It will depend on the type of local anesthetic used. On the average, 1 to 8 hours.   From steroids: Decrease in swelling or inflammation. Once inflammation is improved, relief of the pain will follow.  Onset of benefits: Depends on the amount of swelling present. The more swelling, the longer it will take for the benefits to be seen. In some cases, up to 10 days.  Duration: Steroids will stay in the system x 2 weeks. Duration of benefits will depend on multiple posibilities including persistent irritating factors.  Occasional side-effects: Facial flushing (red, warm cheeks) , cramps (if present, drink Gatorade and take over-the-counter Magnesium 450-500 mg once to twice a day).  From procedure: Some discomfort is to be expected once the numbing medicine wears off. This should be minimal if ice and heat are applied as instructed.  Call if:  You experience numbness and weakness that gets worse with time, as opposed to wearing off.  New onset bowel or bladder incontinence. (This applies to Spinal procedures only)  Emergency Numbers:  Durning business hours (Monday - Thursday, 8:00 AM - 4:00 PM) (Friday, 9:00 AM - 12:00 Noon): [phone]  After hours: [phone] ____________________________________________________________________________________________    ______________________________________________________________________________________________  Specialty Pain Scale  Introduction:  There are significant differences in how pain is reported. The word pain usually refers to physical pain, but it is also a common synonym of suffering. The medical community uses a scale from 0 (zero) to 10 (ten) to report pain level. Zero (0) is described as "no pain", while ten (10) is described as "the worse pain you can imagine". The problem with this   scale is that physical pain is reported along with suffering. Suffering refers to mental pain, or more often  yet it refers to any unpleasant feeling, emotion or aversion associated with the perception of harm or threat of harm. It is the psychological component of pain.  Pain Specialists prefer to separate the two components. The pain scale used by this practice is the Verbal Numerical Rating Scale (VNRS-11). This scale is for the physical pain only. DO NOT INCLUDE how your pain psychologically affects you. This scale is for adults 21 years of age and older. It has 11 (eleven) levels. The 1st level is 0/10. This means: "right now, I have no pain". In the context of pain management, it also means: "right now, my physical pain is under control with the current therapy".  General Information:  The scale should reflect your current level of pain. Unless you are specifically asked for the level of your worst pain, or your average pain. If you are asked for one of these two, then it should be understood that it is over the past 24 hours.  Levels 1 (one) through 5 (five) are described below, and can be treated as an outpatient. Ambulatory pain management facilities such as ours are more than adequate to treat these levels. Levels 6 (six) through 10 (ten) are also described below, however, these must be treated as a hospitalized patient. While levels 6 (six) and 7 (seven) may be evaluated at an urgent care facility, levels 8 (eight) through 10 (ten) constitute medical emergencies and as such, they belong in a hospital's emergency department. When having these levels (as described below), do not come to our office. Our facility is not equipped to manage these levels. Go directly to an urgent care facility or an emergency department to be evaluated.  Definitions:  Activities of Daily Living (ADL): Activities of daily living (ADL or ADLs) is a term used in healthcare to refer to people's daily self-care activities. Health professionals often use a person's ability or inability to perform ADLs as a measurement of their  functional status, particularly in regard to people post injury, with disabilities and the elderly. There are two ADL levels: Basic and Instrumental. Basic Activities of Daily Living (BADL  or BADLs) consist of self-care tasks that include: Bathing and showering; personal hygiene and grooming (including brushing/combing/styling hair); dressing; Toilet hygiene (getting to the toilet, cleaning oneself, and getting back up); eating and self-feeding (not including cooking or chewing and swallowing); functional mobility, often referred to as "transferring", as measured by the ability to walk, get in and out of bed, and get into and out of a chair; the broader definition (moving from one place to another while performing activities) is useful for people with different physical abilities who are still able to get around independently. Basic ADLs include the things many people do when they get up in the morning and get ready to go out of the house: get out of bed, go to the toilet, bathe, dress, groom, and eat. On the average, loss of function typically follows a particular order. Hygiene is the first to go, followed by loss of toilet use and locomotion. The last to go is the ability to eat. When there is only one remaining area in which the person is independent, there is a 62.9% chance that it is eating and only a 3.5% chance that it is hygiene. Instrumental Activities of Daily Living (IADL or IADLs) are not necessary for fundamental functioning, but they let an   individual live independently in a community. IADL consist of tasks that include: cleaning and maintaining the house; home establishment and maintenance; care of others (including selecting and supervising caregivers); care of pets; child rearing; managing money; managing financials (investments, etc.); meal preparation and cleanup; shopping for groceries and necessities; moving within the community; safety procedures and emergency responses; health management  and maintenance (taking prescribed medications); and using the telephone or other form of communication.  Instructions:  Most patients tend to report their pain as a combination of two factors, their physical pain and their psychosocial pain. This last one is also known as "suffering" and it is reflection of how physical pain affects you socially and psychologically. From now on, report them separately.  From this point on, when asked to report your pain level, report only your physical pain. Use the following table for reference.  Pain Clinic Pain Levels (0-5/10)  Pain Level Score  Description  No Pain 0   Mild pain 1 Nagging, annoying, but does not interfere with basic activities of daily living (ADL). Patients are able to eat, bathe, get dressed, toileting (being able to get on and off the toilet and perform personal hygiene functions), transfer (move in and out of bed or a chair without assistance), and maintain continence (able to control bladder and bowel functions). Blood pressure and heart rate are unaffected. A normal heart rate for a healthy adult ranges from 60 to 100 bpm (beats per minute).   Mild to moderate pain 2 Noticeable and distracting. Impossible to hide from other people. More frequent flare-ups. Still possible to adapt and function close to normal. It can be very annoying and may have occasional stronger flare-ups. With discipline, patients may get used to it and adapt.   Moderate pain 3 Interferes significantly with activities of daily living (ADL). It becomes difficult to feed, bathe, get dressed, get on and off the toilet or to perform personal hygiene functions. Difficult to get in and out of bed or a chair without assistance. Very distracting. With effort, it can be ignored when deeply involved in activities.   Moderately severe pain 4 Impossible to ignore for more than a few minutes. With effort, patients may still be able to manage work or participate in some social  activities. Very difficult to concentrate. Signs of autonomic nervous system discharge are evident: dilated pupils (mydriasis); mild sweating (diaphoresis); sleep interference. Heart rate becomes elevated (>115 bpm). Diastolic blood pressure (lower number) rises above 100 mmHg. Patients find relief in laying down and not moving.   Severe pain 5 Intense and extremely unpleasant. Associated with frowning face and frequent crying. Pain overwhelms the senses.  Ability to do any activity or maintain social relationships becomes significantly limited. Conversation becomes difficult. Pacing back and forth is common, as getting into a comfortable position is nearly impossible. Pain wakes you up from deep sleep. Physical signs will be obvious: pupillary dilation; increased sweating; goosebumps; brisk reflexes; cold, clammy hands and feet; nausea, vomiting or dry heaves; loss of appetite; significant sleep disturbance with inability to fall asleep or to remain asleep. When persistent, significant weight loss is observed due to the complete loss of appetite and sleep deprivation.  Blood pressure and heart rate becomes significantly elevated. Caution: If elevated blood pressure triggers a pounding headache associated with blurred vision, then the patient should immediately seek attention at an urgent or emergency care unit, as these may be signs of an impending stroke.    Emergency Department Pain Levels (6-10/10)    Emergency Room Pain 6 Severely limiting. Requires emergency care and should not be seen or managed at an outpatient pain management facility. Communication becomes difficult and requires great effort. Assistance to reach the emergency department may be required. Facial flushing and profuse sweating along with potentially dangerous increases in heart rate and blood pressure will be evident.   Distressing pain 7 Self-care is very difficult. Assistance is required to transport, or use restroom. Assistance to  reach the emergency department will be required. Tasks requiring coordination, such as bathing and getting dressed become very difficult.   Disabling pain 8 Self-care is no longer possible. At this level, pain is disabling. The individual is unable to do even the most "basic" activities such as walking, eating, bathing, dressing, transferring to a bed, or toileting. Fine motor skills are lost. It is difficult to think clearly.   Incapacitating pain 9 Pain becomes incapacitating. Thought processing is no longer possible. Difficult to remember your own name. Control of movement and coordination are lost.   The worst pain imaginable 10 At this level, most patients pass out from pain. When this level is reached, collapse of the autonomic nervous system occurs, leading to a sudden drop in blood pressure and heart rate. This in turn results in a temporary and dramatic drop in blood flow to the brain, leading to a loss of consciousness. Fainting is one of the body's self defense mechanisms. Passing out puts the brain in a calmed state and causes it to shut down for a while, in order to begin the healing process.    Summary: 1. Refer to this scale when providing us with your pain level. 2. Be accurate and careful when reporting your pain level. This will help with your care. 3. Over-reporting your pain level will lead to loss of credibility. 4. Even a level of 1/10 means that there is pain and will be treated at our facility. 5. High, inaccurate reporting will be documented as "Symptom Exaggeration", leading to loss of credibility and suspicions of possible secondary gains such as obtaining more narcotics, or wanting to appear disabled, for fraudulent reasons. 6. Only pain levels of 5 or below will be seen at our facility. 7. Pain levels of 6 and above will be sent to the Emergency Department and the appointment  cancelled. ______________________________________________________________________________________________    

## 2018-05-16 NOTE — Progress Notes (Addendum)
Patient's Name: Brenda Clarke  MRN: [id number]  Referring Provider: Orland Mustard *  DOB: 1945/11/14  PCP: McLean-Scocuzza, Nino Glow, MD  DOS: 05/16/2018  Note by: Gaspar Cola, MD  Service setting: Ambulatory outpatient  Specialty: Interventional Pain Management  Patient type: Established  Location: [facility] (AMB) Pain Management Facility  Visit type: Interventional Procedure   Primary Reason for Visit: Interventional Pain Management Treatment. CC: Back Pain (lower)  Procedure:          Anesthesia, Analgesia, Anxiolysis:  Type: Thoracolumbar Facet, Medial Branch Block(s) #1  Primary Purpose: Diagnostic Region: Posterolateral Lumbosacral Spine Level: T10, T11, T12, L1, & L2 Medial Branch Level(s). Injecting these levels blocks the T11-12, T12-L1, and L1-2 thoracolumbar facet joints. Laterality: Bilateral  Type: Moderate (Conscious) Sedation combined with Local Anesthesia Indication(s): Analgesia and Anxiety Route: Intravenous (IV) IV Access: Secured Sedation: Meaningful verbal contact was maintained at all times during the procedure  Local Anesthetic: Lidocaine 1-2%  Position: Prone   Indications: 1. Spondylosis without myelopathy or radiculopathy, lumbar region   2. Lumbar facet syndrome (Bilateral)   3. Lumbar facet hypertrophy (Multilevel) (Bilateral)   4. Osteoarthritis of facet joint of lumbar spine   5. Spondylosis without myelopathy or radiculopathy, thoracic region   6. Thoracic facet syndrome (Bilateral)   7. DDD (degenerative disc disease), lumbar   8. Chronic low back pain [facility] Area of Pain) (Right)    Pain Score: Pre-procedure: 6 /10 Post-procedure: 0-No pain/10  Once again, today we spent a significant amount of time with this patient, prior to the procedure, explained to her what our expectations were and that she should not expect permanent relief from this or any other blocks that we have to offer her.  I was very clear with her in explaining that  she has a chronic pain problem that has been determined by all of her physicians to be permanent and that he may require management and this is the reason why she is here.  I was very clear with her that we are not a "Pain Elimination Clinic", but a "Pain Management Clinic".  Today I had to explain to the patient why I could not comply with her request to inject all of her facet joints in her entire spine.  Despite the fact that she has been informed in writing that we did not do any medication management, medication changes, or medication refills during procedure appointments and the fact that she was actually here yesterday, she waited until today to tell me that she needed refills the Elavil and Mobic.  I went ahead and complied with her request but I have again informed her that the medication refills are done during evaluation days.  I keep receiving that she has an "attitude" towards [place] that has been present since day 1 and I cannot really explain why she seemed to dislike [place] so much.  I would understand if it was an issue of "senile dementia", but she does not present with signs or symptoms associated with this.  She seems to have a very strong personality and this can be seen in her interaction between her and her husband.  She seems to be the [employer] and he takes a more submissive role.  She seems to have a very clear idea of where she wants to go and apparently she takes issue with [place] when things do not go as she wishes.  For the time being I am going to give her some slack since it could be  that she is not psychologically handling her pain well.  If this continues, I may have to offer her a referral to medical psychology.  However, I have a gut feeling that she will not like that and she will probably get further offended.  Pre-op Assessment:  Brenda Clarke is a 73 y.o. (year old), female patient, seen today for interventional treatment. She  has a past surgical history that includes Colonoscopy;  Bronchoscopy; Nasal sinus surgery; Tonsillectomy; Eye surgery; Cataract extraction w/ intraocular lens  implant, bilateral (Bilateral); and Shoulder arthroscopy with open rotator cuff repair (Left, 09/15/2017). Brenda Clarke has a current medication list which includes the following prescription(s): amitriptyline, calcium citrate, cholecalciferol, duloxetine, enalapril, estradiol, ezetimibe, gabapentin, ivacaftor, meloxicam, metoprolol succinate, multiple vitamins-minerals, fish oil, omeprazole, sertraline, simvastatin, sodium chloride, temazepam, tizanidine, and galantamine. Her primarily concern today is the Back Pain (lower)  Initial Vital Signs:  Pulse/HCG Rate: 80ECG Heart Rate: 86 Temp: 98.2 F (36.8 C) Resp: 16 BP: 108/65 SpO2: 100 %  BMI: Estimated body mass index is 19.8 kg/m as calculated from the following:   Height as of this encounter: 5\' 5"  (1.651 m).   Weight as of this encounter: 119 lb (54 kg).  Risk Assessment: Allergies: Reviewed. She is allergic to tobramycin; codeine; galantamine; and hydrocodone.  Allergy Precautions: None required Coagulopathies: Reviewed. None identified.  Blood-thinner therapy: None at this time Active Infection(s): Reviewed. None identified. Brenda Clarke is afebrile  Site Confirmation: Brenda Clarke was asked to confirm the procedure and laterality before marking the site Procedure checklist: Completed Consent: Before the procedure and under the influence of no sedative(s), amnesic(s), or anxiolytics, the patient was informed of the treatment options, risks and possible complications. To fulfill our ethical and legal obligations, as recommended by the American Medical Association's Code of Ethics, I have informed the patient of my clinical impression; the nature and purpose of the treatment or procedure; the risks, benefits, and possible complications of the intervention; the alternatives, including doing nothing; the risk(s) and benefit(s) of the alternative  treatment(s) or procedure(s); and the risk(s) and benefit(s) of doing nothing. The patient was provided information about the general risks and possible complications associated with the procedure. These may include, but are not limited to: failure to achieve desired goals, infection, bleeding, organ or nerve damage, allergic reactions, paralysis, and death. In addition, the patient was informed of those risks and complications associated to Spine-related procedures, such as failure to decrease pain; infection (i.e.: Meningitis, epidural or intraspinal abscess); bleeding (i.e.: epidural hematoma, subarachnoid hemorrhage, or any other type of intraspinal or peri-dural bleeding); organ or nerve damage (i.e.: Any type of peripheral nerve, nerve root, or spinal cord injury) with subsequent damage to sensory, motor, and/or autonomic systems, resulting in permanent pain, numbness, and/or weakness of one or several areas of the body; allergic reactions; (i.e.: anaphylactic reaction); and/or death. Furthermore, the patient was informed of those risks and complications associated with the medications. These include, but are not limited to: allergic reactions (i.e.: anaphylactic or anaphylactoid reaction(s)); adrenal axis suppression; blood sugar elevation that in diabetics may result in ketoacidosis or comma; water retention that in patients with history of congestive heart failure may result in shortness of breath, pulmonary edema, and decompensation with resultant heart failure; weight gain; swelling or edema; medication-induced neural toxicity; particulate matter embolism and blood vessel occlusion with resultant organ, and/or nervous system infarction; and/or aseptic necrosis of one or more joints. Finally, the patient was informed that Medicine is not an exact science; therefore,  there is also the possibility of unforeseen or unpredictable risks and/or possible complications that may result in a catastrophic  outcome. The patient indicated having understood very clearly. We have given the patient no guarantees and we have made no promises. Enough time was given to the patient to ask questions, all of which were answered to the patient's satisfaction. Ms. Kovich has indicated that she wanted to continue with the procedure. Attestation: I, the ordering provider, attest that I have discussed with the patient the benefits, risks, side-effects, alternatives, likelihood of achieving goals, and potential problems during recovery for the procedure that I have provided informed consent. Date  Time: 05/16/2018  1:04 PM  Pre-Procedure Preparation:  Monitoring: As per clinic protocol. Respiration, ETCO2, SpO2, BP, heart rate and rhythm monitor placed and checked for adequate function Safety Precautions: Patient was assessed for positional comfort and pressure points before starting the procedure. Time-out: I initiated and conducted the "Time-out" before starting the procedure, as per protocol. The patient was asked to participate by confirming the accuracy of the "Time Out" information. Verification of the correct person, site, and procedure were performed and confirmed by me, the nursing staff, and the patient. "Time-out" conducted as per Joint Commission's Universal Protocol (UP.01.01.01). Time: 1358  Description of Procedure:          Laterality: Bilateral. The procedure was performed in identical fashion on both sides. Levels:  T10, T11, T12, L1, & L2 Medial Branch Level(s) Area Prepped: Posterior Lumbosacral Region Prepping solution: ChloraPrep (2% chlorhexidine gluconate and 70% isopropyl alcohol) Safety Precautions: Aspiration looking for blood return was conducted prior to all injections. At no point did we inject any substances, as a needle was being advanced. Before injecting, the patient was told to immediately notify me if she was experiencing any new onset of "ringing in the ears, or metallic taste in the  mouth". No attempts were made at seeking any paresthesias. Safe injection practices and needle disposal techniques used. Medications properly checked for expiration dates. SDV (single dose vial) medications used. After the completion of the procedure, all disposable equipment used was discarded in the proper designated medical waste containers. Local Anesthesia: Protocol guidelines were followed. The patient was positioned over the fluoroscopy table. The area was prepped in the usual manner. The time-out was completed. The target area was identified using fluoroscopy. A 12-in long, straight, sterile hemostat was used with fluoroscopic guidance to locate the targets for each level blocked. Once located, the skin was marked with an approved surgical skin marker. Once all sites were marked, the skin (epidermis, dermis, and hypodermis), as well as deeper tissues (fat, connective tissue and muscle) were infiltrated with a small amount of a short-acting local anesthetic, loaded on a 10cc syringe with a 25G, 1.5-in  Needle. An appropriate amount of time was allowed for local anesthetics to take effect before proceeding to the next step. Local Anesthetic: Lidocaine 2.0% The unused portion of the local anesthetic was discarded in the proper designated containers. Technical explanation of process:  T10 Medial Branch Nerve Block (MBB): The target area for the T10 medial branch is at the junction of the postero-lateral aspect of the superior articular process and the superior, posterior, and medial edge of the transverse process of T11. Under fluoroscopic guidance, a Quincke needle was inserted until contact was made with os over the superior postero-lateral aspect of the pedicular shadow (target area). After negative aspiration for blood, 0.5 mL of the nerve block solution was injected without difficulty or complication.  The needle was removed intact. T11 Medial Branch Nerve Block (MBB): The target area for the T11 medial  branch is at the junction of the postero-lateral aspect of the superior articular process and the superior, posterior, and medial edge of the transverse process of T12. Under fluoroscopic guidance, a Quincke needle was inserted until contact was made with os over the superior postero-lateral aspect of the pedicular shadow (target area). After negative aspiration for blood, 0.5 mL of the nerve block solution was injected without difficulty or complication. The needle was removed intact. T12 Medial Branch Nerve Block (MBB): The target area for the T12 medial branch is at the junction of the postero-lateral aspect of the superior articular process and the superior, posterior, and medial edge of the transverse process of L1. Under fluoroscopic guidance, a Quincke needle was inserted until contact was made with os over the superior postero-lateral aspect of the pedicular shadow (target area). After negative aspiration for blood, 0.5 mL of the nerve block solution was injected without difficulty or complication. The needle was removed intact. L1 Medial Branch Nerve Block (MBB): The target area for the L1 medial branch is at the junction of the postero-lateral aspect of the superior articular process and the superior, posterior, and medial edge of the transverse process of L2. Under fluoroscopic guidance, a Quincke needle was inserted until contact was made with os over the superior postero-lateral aspect of the pedicular shadow (target area). After negative aspiration for blood, 0.5 mL of the nerve block solution was injected without difficulty or complication. The needle was removed intact. L2 Medial Branch Nerve Block (MBB): The target area for the L2 medial branch is at the junction of the postero-lateral aspect of the superior articular process and the superior, posterior, and medial edge of the transverse process of L3. Under fluoroscopic guidance, a Quincke needle was inserted until contact was made with os over  the superior postero-lateral aspect of the pedicular shadow (target area). After negative aspiration for blood, 0.5 mL of the nerve block solution was injected without difficulty or complication. The needle was removed intact.  Nerve block solution: 0.2% PF-Ropivacaine + Triamcinolone (40 mg/mL) diluted to a final concentration of 4 mg of Triamcinolone/mL of Ropivacaine The unused portion of the solution was discarded in the proper designated containers. Procedural Needles: 22-gauge, 3.5-inch, Quincke needles used for all levels.  Once the entire procedure was completed, the treated area was cleaned, making sure to leave some of the prepping solution back to take advantage of its long term bactericidal properties.  Vitals:   05/16/18 1411 05/16/18 1421 05/16/18 1431 05/16/18 1441  BP: 120/68 111/64 110/62 110/64  Pulse:      Resp: 19 (!) 24 20 20   Temp:  99.1 F (37.3 C)  98.4 F (36.9 C)  TempSrc:      SpO2: 97% 100% 100% 100%  Weight:      Height:         Start Time: 1358 hrs. End Time: 1408 hrs.  Imaging Guidance (Spinal):          Type of Imaging Technique: Fluoroscopy Guidance (Spinal) Indication(s): Assistance in needle guidance and placement for procedures requiring needle placement in or near specific anatomical locations not easily accessible without such assistance. Exposure Time: Please see nurses notes. Contrast: None used. Fluoroscopic Guidance: I was personally present during the use of fluoroscopy. "Tunnel Vision Technique" used to obtain the best possible view of the target area. Parallax error corrected before commencing the procedure. "Direction-depth-direction"  technique used to introduce the needle under continuous pulsed fluoroscopy. Once target was reached, antero-posterior, oblique, and lateral fluoroscopic projection used confirm needle placement in all planes. Images permanently stored in EMR. Interpretation: No contrast injected. I personally interpreted the  imaging intraoperatively. Adequate needle placement confirmed in multiple planes. Permanent images saved into the patient's record.  Antibiotic Prophylaxis:   Anti-infectives (From admission, onward)   None     Indication(s): None identified  Post-operative Assessment:  Post-procedure Vital Signs:  Pulse/HCG Rate: 8077 Temp: 98.4 F (36.9 C) Resp: 20 BP: 110/64 SpO2: 100 %  EBL: None  Complications: No immediate post-treatment complications observed by team, or reported by patient.  Note: The patient tolerated the entire procedure well. A repeat set of vitals were taken after the procedure and the patient was kept under observation following institutional policy, for this type of procedure. Post-procedural neurological assessment was performed, showing return to baseline, prior to discharge. The patient was provided with post-procedure discharge instructions, including a section on how to identify potential problems. Should any problems arise concerning this procedure, the patient was given instructions to immediately contact us, at any time, without hesitation. In any case, we plan to contact the patient by telephone for a follow-up status report regarding this interventional procedure.  Comments:  No additional relevant information.  Plan of Care   Possible POC:  Elavil trial at 10 mg p.o. at bedtime was successful and the patient indicated she needed additional refills. Mobic trial at 15 mg p.o. daily with breakfast was successful and the patient indicated she needed additional refills.   Imaging Orders     DG C-Arm 1-60 Min-No Report  Procedure Orders     THORACIC FACET BLOCK     LUMBAR FACET(MEDIAL BRANCH NERVE BLOCK) MBNB  Medications ordered for procedure: Meds ordered this encounter  Medications  . lidocaine (XYLOCAINE) 2 % (with pres) injection 400 mg  . DISCONTD: midazolam (VERSED) 5 MG/5ML injection 1-2 mg    Make sure Flumazenil is available in the pyxis when  using this medication. If oversedation occurs, administer 0.2 mg IV over 15 sec. If after 45 sec no response, administer 0.2 mg again over 1 min; may repeat at 1 min intervals; not to exceed 4 doses (1 mg)  . DISCONTD: fentaNYL (SUBLIMAZE) injection 25-50 mcg    Make sure Narcan is available in the pyxis when using this medication. In the event of respiratory depression (RR< 8/min): Titrate NARCAN (naloxone) in increments of 0.1 to 0.2 mg IV at 2-3 minute intervals, until desired degree of reversal.  . lactated ringers infusion 1,000 mL  . ropivacaine (PF) 2 mg/mL (0.2%) (NAROPIN) injection 18 mL  . triamcinolone acetonide (KENALOG-40) injection 80 mg  . amitriptyline (ELAVIL) 10 MG tablet    Sig: Take 1 tablet (10 mg total) by mouth at bedtime.    Dispense:  30 tablet    Refill:  5  . meloxicam (MOBIC) 15 MG tablet    Sig: Take 1 tablet (15 mg total) by mouth daily.    Dispense:  30 tablet    Refill:  5    Do not add this medication to the electronic "Automatic Refill" notification system. Patient may have prescription filled one day early if pharmacy is closed on scheduled refill date.   Medications administered: We administered lidocaine, midazolam, fentaNYL, lactated ringers, ropivacaine (PF) 2 mg/mL (0.2%), and triamcinolone acetonide.  See the medical record for exact dosing, route, and time of administration.  Disposition: Discharge home  Discharge Date & Time: 05/16/2018; 1452 hrs.   Physician-requested Follow-up: Return for PPE (2 wks), w/ Dr. Dossie Arbour.  Future Appointments  Date Time Provider [place]  05/31/2018 11:15 AM Milinda Pointer, MD [facility] None  08/15/2018  2:00 PM McLean-Scocuzza, Nino Glow, MD [facility]   Primary Care Physician: McLean-Scocuzza, Nino Glow, MD Location: [facility] Out[facility] Pain Management Facility Note by: Gaspar Cola, MD Date: 05/16/2018; Time: 6:44 AM  Disclaimer:  Medicine is not an Chief Strategy Officer. The only guarantee in  medicine is that nothing is guaranteed. It is important to note that the decision to proceed with this intervention was based on the information collected from the patient. The Data and conclusions were drawn from the patient's questionnaire, the interview, and the physical examination. Because the information was provided in large part by the patient, it cannot be guaranteed that it has not been purposely or unconsciously manipulated. Every effort has been made to obtain as much relevant data as possible for this evaluation. It is important to note that the conclusions that lead to this procedure are derived in large part from the available data. Always take into account that the treatment will also be dependent on availability of resources and existing treatment guidelines, considered by other Pain Management Practitioners as being common knowledge and practice, at the time of the intervention. For Medico-Legal purposes, it is also important to point out that variation in procedural techniques and pharmacological choices are the acceptable norm. The indications, contraindications, technique, and results of the above procedure should only be interpreted and judged by a Board-Certified Interventional Pain Specialist with extensive familiarity and expertise in the same exact procedure and technique.

## 2018-05-16 NOTE — Progress Notes (Signed)
Safety precautions to be maintained throughout the outpatient stay will include: orient to surroundings, keep bed in low position, maintain call bell within reach at all times, provide assistance with transfer out of bed and ambulation.  

## 2018-05-17 ENCOUNTER — Telehealth: Payer: Self-pay | Admitting: *Deleted

## 2018-05-17 NOTE — Telephone Encounter (Signed)
Attempted to call for post procedure follow-up. No answer. 

## 2018-05-17 NOTE — Unmapped (Signed)
[facility] Refill Coordination Note    Specialty Medication(s) to be Shipped:   CF/Pulmonary: -[name] (ivacaftor) 150mg  tablet    Other medication(s) to be shipped: N/A     Eulas Post, DOB: 12/28/1945  Phone: [phone] (home)       All above HIPAA information was verified with patient.     Completed refill call assessment today to schedule patient's medication shipment from the [facility] [phone]).       Specialty medication(s) and dose(s) confirmed: Regimen is correct and unchanged.   Changes to medications: [name] reports no changes reported at this time.  Changes to insurance: No  Questions for the pharmacist: No    The patient will receive a drug information handout for each medication shipped and additional FDA Medication Guides as required.      DISEASE/MEDICATION-SPECIFIC INFORMATION        For CF patients: CF [facility] Active? Yes    ADHERENCE     Medication Adherence    Patient reported X missed doses in the last month:  0  Specialty Medication:  kalydeco  Patient is on additional specialty medications:  No  Patient is on more than two specialty medications:  No  Any gaps in refill history greater than 2 weeks in the last 3 months:  no  Support network for adherence:  family member          Refill Coordination    Has the Patients' Contact Information Changed:  No  Is the Shipping Address Different:  No         MEDICARE PART B DOCUMENTATION     kalydeco: Patient has 1 week on hand.    SHIPPING     Shipping address confirmed in Epic.     Delivery Scheduled: Yes, Expected medication delivery date: 05/19/2018 via UPS or courier.     Medication will be delivered via Next Day Courier to the home address in Epic [place].    Lelah Rennaker Perlie Gold   [facility] Specialty Technician

## 2018-05-18 MED FILL — KALYDECO 150 MG TABLET: 28 days supply | Qty: 56 | Fill #1 | Status: AC

## 2018-05-18 MED FILL — KALYDECO 150 MG TABLET: ORAL | 28 days supply | Qty: 56 | Fill #1

## 2018-05-27 ENCOUNTER — Encounter: Payer: Self-pay | Admitting: Internal Medicine

## 2018-05-28 NOTE — Progress Notes (Deleted)
Patient's Name: Brenda Clarke  MRN: [id number]  Referring Provider: Orland Mustard *  DOB: [birth year]  PCP: McLean-Scocuzza, Nino Glow, MD  DOS: 05/31/2018  Note by: Gaspar Cola, MD  Service setting: Ambulatory outpatient  Specialty: Interventional Pain Management  Location: [facility] (AMB) Pain Management Facility    Patient type: Established   Primary Reason(s) for Visit: Encounter for post-procedure evaluation of chronic illness with mild to moderate exacerbation CC: No chief complaint on file.  HPI  [name] is a 73 y.o. year old, female patient, who comes today for a post-procedure evaluation. 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[place]); Cystic fibrosis ([place]); Essential hypertension; Fibrocystic breast disease; Gastro-esophageal reflux disease with esophagitis; Heartburn; Hyperlipidemia; Laceration of finger; Low serum vitamin D; Migraine without status migrainosus, not intractable; Monocular diplopia; DDD (degenerative disc disease), cervical; Chronic neck pain; Pain in finger; Prolapse of anterior vaginal wall; Recurrent major depressive disorder, in partial remission ([place]); Chronic low back pain [facility]) (Right); Chronic tendinitis of rotator cuff (Left); S/P cataract extraction and insertion of intraocular lens, left; Screen for colon cancer; Spondylosis of cervical region without myelopathy or radiculopathy; Tubular adenoma; Uterovaginal prolapse; Chronic shoulder pain (Primary Area of Pain) (Left); Chronic upper extremity pain (Secondary Area of Pain) (Left); Chronic pain syndrome; Long term current use of opiate analgesic; Pharmacologic therapy; Disorder of skeletal system; Problems influencing health status; Cervicalgia; Traumatic tear of rotator cuff, sequela (Left);  Supraspinatus tendonitis (Left); Infraspinatus tendonitis (Left); Osteoarthritis of AC (acromioclavicular) joint (Left); Subacromial bursitis (Left); Subdeltoid bursitis (Left); Effusion of glenohumeral joint (Left); History of traumatic closed nondisplaced fracture of greater tuberosity of humerus (Left); Chronic upper back pain (Bilateral); Pain of rhomboid muscle; Abnormal x-ray of cervical spine; Cervical Grade 1 Anterolisthesis of C4/C5 & C5/C6.; Cervical facet arthropathy (Bilateral); Cervical facet syndrome (Bilateral); 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); Osteoarthritis of cervical spine; Left humeral fracture; Medicare annual wellness visit, initial; Abnormal MRI, cervical spine (03/24/2018); Thoracic facet syndrome (Bilateral); Osteoarthritis involving multiple joints; Neurogenic pain; Muscle pain, fibromyalgia; Allodynia; Chronic thoracic back pain (Bilateral); Spondylosis without myelopathy or radiculopathy, thoracic region; Vitamin D deficiency; Osteoporosis; Anxiety; Memory loss; Insomnia; Lumbar facet syndrome (Bilateral); Spondylosis without myelopathy or radiculopathy, lumbar region; DDD (degenerative disc disease), lumbar; Lumbar facet hypertrophy (Multilevel) (Bilateral); and Osteoarthritis of facet joint of lumbar spine on their problem list. Her primarily concern today is the No chief complaint on file.  Pain Assessment: Location:     Radiating:   Onset:   Duration:   Quality:   Severity:  /10 (subjective, self-reported pain score)  Note: Reported level is compatible with observation.                         When using our objective Pain Scale, levels between 6 and 10/10 are said to belong in an emergency room, as it progressively worsens from a 6/10, described as severely limiting, requiring emergency care not usually available at an outpatient pain management facility. At a 6/10 level, communication becomes difficult and requires great effort. Assistance to reach the  emergency department may be required. Facial flushing and profuse sweating along with potentially dangerous increases in heart rate and blood pressure will be evident. Effect on ADL:   Timing:   Modifying factors:   BP:    HR:    [name] comes  in today for post-procedure evaluation.  Further details on both, my assessment(s), as well as the proposed treatment plan, please see below.  Post-Procedure Assessment  05/16/2018 Procedure: *** Pre-procedure pain score:        /10 Post-procedure pain score: 0/10         Influential Factors: BMI:   Intra-procedural challenges: None observed.         Assessment challenges: None detected.              Reported side-effects: None.        Post-procedural adverse reactions or complications: None reported         Sedation: Please see nurses note. When no sedatives are used, the analgesic levels obtained are directly associated to the effectiveness of the local anesthetics. However, when sedation is provided, the level of analgesia obtained during the initial 1 hour following the intervention, is believed to be the result of a combination of factors. These factors may include, but are not limited to: 1. The effectiveness of the local anesthetics used. 2. The effects of the analgesic(s) and/or anxiolytic(s) used. 3. The degree of discomfort experienced by the patient at the time of the procedure. 4. The patients ability and reliability in recalling and recording the events. 5. The presence and influence of possible secondary gains and/or psychosocial factors. Reported result: Relief experienced during the 1st hour after the procedure:   (Ultra-Short Term Relief)            Interpretative annotation: Clinically appropriate result. Analgesia during this period is likely to be Local Anesthetic and/or IV Sedative (Analgesic/Anxiolytic) related.          Effects of local anesthetic: The analgesic effects attained during this period are directly associated  to the localized infiltration of local anesthetics and therefore cary significant diagnostic value as to the etiological location, or anatomical origin, of the pain. Expected duration of relief is directly dependent on the pharmacodynamics of the local anesthetic used. Long-acting (4-6 hours) anesthetics used.  Reported result: Relief during the next 4 to 6 hour after the procedure:   (Short-Term Relief)            Interpretative annotation: Clinically appropriate result. Analgesia during this period is likely to be Local Anesthetic-related.          Long-term benefit: Defined as the period of time past the expected duration of local anesthetics (1 hour for short-acting and 4-6 hours for long-acting). With the possible exception of prolonged sympathetic blockade from the local anesthetics, benefits during this period are typically attributed to, or associated with, other factors such as analgesic sensory neuropraxia, antiinflammatory effects, or beneficial biochemical changes provided by agents other than the local anesthetics.  Reported result: Extended relief following procedure:   (Long-Term Relief)            Interpretative annotation: Clinically possible results. Good relief. No permanent benefit expected. Inflammation plays a part in the etiology to the pain.          Current benefits: Defined as reported results that persistent at this point in time.   Analgesia: *** %            Function: Somewhat improved ROM: Somewhat improved Interpretative annotation: Recurrence of symptoms. No permanent benefit expected. Effective diagnostic intervention.          Interpretation: Results would suggest a successful diagnostic intervention.                  Plan:  Please see "Plan of Care" for details.                Laboratory Chemistry  Inflammation Markers (CRP: Acute Phase) (ESR: Chronic Phase) Lab Results  Component Value Date   CRP 3 12/01/2017   ESRSEDRATE 5 12/01/2017                          Renal Markers Lab Results  Component Value Date   BUN 20 11/17/2017   CREATININE 0.86 11/17/2017   GFRAA >60 11/17/2017   GFRNONAA >60 11/17/2017                             Hepatic Markers Lab Results  Component Value Date   AST 27 11/17/2017   ALT 16 11/17/2017   ALBUMIN 4.2 11/17/2017                        Note: Lab results reviewed.  Recent Imaging Results   Results for orders placed in visit on 05/16/18  DG C-Arm 1-60 Min-No Report   Narrative Fluoroscopy was utilized by the requesting physician.  No radiographic  interpretation.    Interpretation Report: Fluoroscopy was used during the procedure to assist with needle guidance. The images were interpreted intraoperatively by the requesting physician.        Meds   Current Outpatient Medications:  .  amitriptyline (ELAVIL) 10 MG tablet, Take 1 tablet (10 mg total) by mouth at bedtime., Disp: 30 tablet, Rfl: 5 .  Calcium Citrate 333 MG TABS, Take 2 capsules by mouth daily. , Disp: , Rfl:  .  Cholecalciferol 1.25 MG (50000 UT) capsule, Take 1 capsule (50,000 Units total) by mouth once a week., Disp: 13 capsule, Rfl: 1 .  DULoxetine (CYMBALTA) 30 MG capsule, Take 30 mg by mouth daily., Disp: , Rfl:  .  enalapril (VASOTEC) 5 MG tablet, Take 2.5 mg by mouth daily., Disp: , Rfl:  .  Estradiol 0.5 MG/0.5GM GEL, Place onto the skin. 2x per week Dr. Salome Holmes, Disp: , Rfl:  .  ezetimibe (ZETIA) 10 MG tablet, Take 1 tablet (10 mg total) by mouth daily., Disp: 90 tablet, Rfl: 3 .  gabapentin (NEURONTIN) 100 MG capsule, Take 100 mg by mouth 4 (four) times daily., Disp: , Rfl:  .  galantamine (RAZADYNE) 4 MG tablet, Take 4 mg by mouth every morning., Disp: , Rfl:  .  Ivacaftor ([name]) 150 MG TABS, Take 150 mg by mouth 2 (two) times daily., Disp: , Rfl:  .  meloxicam (MOBIC) 15 MG tablet, Take 1 tablet (15 mg total) by mouth daily., Disp: 30 tablet, Rfl: 5 .  metoprolol succinate (TOPROL-XL) 25 MG 24 hr tablet, Take 1 tablet by  mouth 2 (two) times daily., Disp: , Rfl:  .  Multiple Vitamins-Minerals (WOMENS MULTIVITAMIN PO), Take 1 tablet by mouth daily., Disp: , Rfl:  .  Omega-3 Fatty Acids (FISH OIL) 1200 MG CAPS, Take 1 capsule by mouth daily., Disp: , Rfl:  .  omeprazole (PRILOSEC) 40 MG capsule, Take 40 mg by mouth daily., Disp: , Rfl:  .  sertraline (ZOLOFT) 100 MG tablet, Take 100 mg by mouth at bedtime., Disp: , Rfl:  .  simvastatin (ZOCOR) 40 MG tablet, Take 1 tablet (40 mg total) by mouth daily at 6 PM., Disp: 90 tablet, Rfl: 3 .  Sodium Chloride 10 % NEBU, Inhale 1  Dose into the lungs as needed., Disp: , Rfl:  .  temazepam (RESTORIL) 15 MG capsule, Take 15 mg by mouth at bedtime as needed for sleep., Disp: , Rfl:  .  tiZANidine (ZANAFLEX) 4 MG tablet, Take 4 mg by mouth every 8 (eight) hours as needed for muscle spasms., Disp: , Rfl:   ROS  Constitutional: Denies any fever or chills Gastrointestinal: No reported hemesis, hematochezia, vomiting, or acute GI distress Musculoskeletal: Denies any acute onset joint swelling, redness, loss of ROM, or weakness Neurological: No reported episodes of acute onset apraxia, aphasia, dysarthria, agnosia, amnesia, paralysis, loss of coordination, or loss of consciousness  Allergies  Ms. Casas is allergic to tobramycin; codeine; galantamine; and hydrocodone.  PFSH  Drug: Ms. Halabi  reports no history of drug use. Alcohol:  reports previous alcohol use. Tobacco:  reports that she has never smoked. She has never used smokeless tobacco. Medical:  has a past medical history of Allergy, Bronchiectasis ([place]), Cystic fibrosis ([place]), Depression, Duodenal ulceration, GERD (gastroesophageal reflux disease), History of chicken pox, History of hiatal hernia, migraines, Hypercholesteremia, Hypertension, and Pap smear abnormality of cervix with ASCUS favoring benign. Surgical: Ms. Giovanetti  has a past surgical history that includes Colonoscopy; Bronchoscopy; Nasal sinus surgery;  Tonsillectomy; Eye surgery; Cataract extraction w/ intraocular lens  implant, bilateral (Bilateral); and Shoulder arthroscopy with open rotator cuff repair (Left, 09/15/2017). Family: family history includes Alzheimer's disease in her mother; Cancer in her maternal grandmother; Cystic fibrosis in her father; Dementia in her mother; Hypertension in her mother; Lung disease in her sister; Osteoporosis in her mother; Stroke in her mother.  Constitutional Exam  General appearance: Well nourished, well developed, and well hydrated. In no apparent acute distress There were no vitals filed for this visit. BMI Assessment: Estimated body mass index is 19.8 kg/m as calculated from the following:   Height as of 05/16/18: '5\' 5"'  (1.651 m).   Weight as of 05/16/18: 119 lb (54 kg).  BMI interpretation table: BMI level Category Range association with higher incidence of chronic pain  <18 kg/m2 Underweight   18.5-24.9 kg/m2 Ideal body weight   25-29.9 kg/m2 Overweight Increased incidence by 20%  30-34.9 kg/m2 Obese (Class I) Increased incidence by 68%  35-39.9 kg/m2 Severe obesity (Class II) Increased incidence by 136%  >40 kg/m2 Extreme obesity (Class III) Increased incidence by 254%   Patient's current BMI Ideal Body weight  There is no height or weight on file to calculate BMI. Ideal body weight: 57 kg (125 lb 10.6 oz)   BMI Readings from Last 4 Encounters:  05/16/18 19.80 kg/m  05/15/18 19.80 kg/m  05/10/18 19.84 kg/m  04/25/18 19.47 kg/m   Wt Readings from Last 4 Encounters:  05/16/18 119 lb (54 kg)  05/15/18 119 lb (54 kg)  05/10/18 119 lb 3.2 oz (54.1 kg)  04/25/18 117 lb (53.1 kg)  Psych/Mental status: Alert, oriented x 3 (person, place, & time)       Eyes: PERLA Respiratory: No evidence of acute respiratory distress  Cervical Spine Area Exam  Skin & Axial Inspection: No masses, redness, edema, swelling, or associated skin lesions Alignment: Symmetrical Functional ROM: Unrestricted  ROM      Stability: No instability detected Muscle Tone/Strength: Functionally intact. No obvious neuro-muscular anomalies detected. Sensory (Neurological): Unimpaired Palpation: No palpable anomalies              Upper Extremity (UE) Exam    Side: Right upper extremity  Side: Left upper extremity  Skin & Extremity  Inspection: Skin color, temperature, and hair growth are WNL. No peripheral edema or cyanosis. No masses, redness, swelling, asymmetry, or associated skin lesions. No contractures.  Skin & Extremity Inspection: Skin color, temperature, and hair growth are WNL. No peripheral edema or cyanosis. No masses, redness, swelling, asymmetry, or associated skin lesions. No contractures.  Functional ROM: Unrestricted ROM          Functional ROM: Unrestricted ROM          Muscle Tone/Strength: Functionally intact. No obvious neuro-muscular anomalies detected.  Muscle Tone/Strength: Functionally intact. No obvious neuro-muscular anomalies detected.  Sensory (Neurological): Unimpaired          Sensory (Neurological): Unimpaired          Palpation: No palpable anomalies              Palpation: No palpable anomalies              Provocative Test(s):  Phalen's test: deferred Tinel's test: deferred Apley's scratch test (touch opposite shoulder):  Action 1 (Across chest): deferred Action 2 (Overhead): deferred Action 3 (LB reach): deferred   Provocative Test(s):  Phalen's test: deferred Tinel's test: deferred Apley's scratch test (touch opposite shoulder):  Action 1 (Across chest): deferred Action 2 (Overhead): deferred Action 3 (LB reach): deferred    Thoracic Spine Area Exam  Skin & Axial Inspection: No masses, redness, or swelling Alignment: Symmetrical Functional ROM: Unrestricted ROM Stability: No instability detected Muscle Tone/Strength: Functionally intact. No obvious neuro-muscular anomalies detected. Sensory (Neurological): Unimpaired Muscle strength & Tone: No palpable  anomalies  Lumbar Spine Area Exam  Skin & Axial Inspection: No masses, redness, or swelling Alignment: Symmetrical Functional ROM: Unrestricted ROM       Stability: No instability detected Muscle Tone/Strength: Functionally intact. No obvious neuro-muscular anomalies detected. Sensory (Neurological): Unimpaired Palpation: No palpable anomalies       Provocative Tests: Hyperextension/rotation test: deferred today       Lumbar quadrant test (Kemp's test): deferred today       Lateral bending test: deferred today       Patrick's Maneuver: deferred today                   FABER* test: deferred today                   S-I anterior distraction/compression test: deferred today         S-I lateral compression test: deferred today         S-I Thigh-thrust test: deferred today         S-I Gaenslen's test: deferred today         *(Flexion, ABduction and External Rotation)  Gait & Posture Assessment  Ambulation: Unassisted Gait: Relatively normal for age and body habitus Posture: WNL   Lower Extremity Exam    Side: Right lower extremity  Side: Left lower extremity  Stability: No instability observed          Stability: No instability observed          Skin & Extremity Inspection: Skin color, temperature, and hair growth are WNL. No peripheral edema or cyanosis. No masses, redness, swelling, asymmetry, or associated skin lesions. No contractures.  Skin & Extremity Inspection: Skin color, temperature, and hair growth are WNL. No peripheral edema or cyanosis. No masses, redness, swelling, asymmetry, or associated skin lesions. No contractures.  Functional ROM: Unrestricted ROM  Functional ROM: Unrestricted ROM                  Muscle Tone/Strength: Functionally intact. No obvious neuro-muscular anomalies detected.  Muscle Tone/Strength: Functionally intact. No obvious neuro-muscular anomalies detected.  Sensory (Neurological): Unimpaired        Sensory (Neurological): Unimpaired         DTR: Patellar: deferred today Achilles: deferred today Plantar: deferred today  DTR: Patellar: deferred today Achilles: deferred today Plantar: deferred today  Palpation: No palpable anomalies  Palpation: No palpable anomalies   Assessment   Status Diagnosis  Controlled Controlled Controlled No diagnosis found.   Updated Problems: No problems updated.  Plan of Care  Pharmacotherapy (Medications Ordered): No orders of the defined types were placed in this encounter.  Medications administered today: Connye Burkitt. Kontz had no medications administered during this visit.  Orders:  No orders of the defined types were placed in this encounter.  Lab Orders  No laboratory test(s) ordered today   Imaging Orders  No imaging studies ordered today   Referral Orders  No referral(s) requested today   Planned follow-up:   No follow-ups on file. ***   Interventional management options: Considering:   ***   Palliative PRN treatment(s):   None at this time   Future Appointments  Date Time Provider Lillian  05/31/2018 11:15 AM Milinda Pointer, MD [facility] None  08/15/2018  2:00 PM McLean-Scocuzza, Nino Glow, MD [facility]   Primary Care Physician: McLean-Scocuzza, Nino Glow, MD Location: [facility] Note by: Gaspar Cola, MD Date: 05/31/2018; Time: 7:35 PM

## 2018-05-31 ENCOUNTER — Ambulatory Visit: Payer: Medicare Other | Admitting: Pain Medicine

## 2018-06-05 ENCOUNTER — Encounter: Payer: Self-pay | Admitting: Internal Medicine

## 2018-06-06 ENCOUNTER — Telehealth: Payer: Self-pay | Admitting: Internal Medicine

## 2018-06-06 NOTE — Telephone Encounter (Signed)
Copied from [place] [id number]. Topic: Quick Communication - Rx Refill/Question >> Jun 06, 2018  4:08 PM Waylan Rocher, [place] L wrote: Medication: pain medicine for a bulging disk (says otc does not help and she can't reach Dr. Dossie Arbour office and is hoping Dr. Aundra Dubin will call something in)  Has the patient contacted their pharmacy? No. (Agent: If no, request that the patient contact the pharmacy for the refill.) (Agent: If yes, when and what did the pharmacy advise?)   Preferred Pharmacy (with phone number or street name): [place], [place] - [address] [place] [address] Phone: [phone] Fax: [phone]  Agent: Please be advised that RX refills may take up to 3 business days. We ask that you follow-up with your pharmacy.

## 2018-06-06 NOTE — Telephone Encounter (Signed)
She will violate her pain contract if I fill pain meds from her must come from pain clinic  She has to call there   [place]

## 2018-06-07 ENCOUNTER — Encounter: Payer: Self-pay | Admitting: Pain Medicine

## 2018-06-07 NOTE — Telephone Encounter (Signed)
Patient wants Dr. Aundra Dubin to know she has tried ibuprofen (makes her stomach hurt and has had an ulcer in the past), and is willing to try anything else that is not a controlled substance that Dr. Aundra Dubin thinks might help with the pain. She does not take aspirin and this is very upsetting for her. Advised her Dr. Aundra Dubin is corresponding tieht Dr. Consuela Mimes.

## 2018-06-07 NOTE — Telephone Encounter (Signed)
She did not keep her appt with the pain clinic on 05/31/2018 due to this and if she does not keep appts with pain clinic it is hard for them to treat you for chronic pain and they are who you need to see for chronic pain if you are seeking long term management of your chronic back pain   They are rec. Therapy for chronic pain and this needs to be arranged with their clinic call to schedule a f/u with pain clinic asap  We may even also want yo to work with a psychiatrist about your chronic pain -would you like a referral?  She can try lidocaine patches over the counter for pain with  Tylenol 500 mg up to 6x per day as needed  Physical therapy would be another option, does she want a referral?   She needs to bring all of her medications to his next visit and make sure she is not taking Cymbalta, Zoloft and amitriptyline  -which of these is she taking for chronic pain? As she did not bring her meds to her 1st visit and it is hard to know what she is taking even after I called her pharmacy after her 1st visit

## 2018-06-07 NOTE — Telephone Encounter (Signed)
Pain doctor sent letter to PCP via epic. Please advise. Patient does not want to be seen due to medical history and will not go into dr office.

## 2018-06-09 NOTE — Telephone Encounter (Signed)
Unable to leave message for patient to return call back. [facility] may give information.  

## 2018-06-09 NOTE — Telephone Encounter (Signed)
Tried to advise patient that PCP recommended pain clinic for chronic pain, patient interrupted and stated "she is not going to a pain clinic that she has cystic fibrosis and cannot go to such a clinic and if PCP does not understand that and read her notes, then what good is she to me , I don't have time for this and hung up phone."

## 2018-06-09 NOTE — Telephone Encounter (Signed)
This is the patient I informed you about needing help to contact.

## 2018-06-09 NOTE — Telephone Encounter (Signed)
Patient called, left VM to return call to the office. 

## 2018-06-09 NOTE — Telephone Encounter (Signed)
Sorry there is nothing else I can do she is established with pain clinic and needs to f/u with them for pain meds  Lidocaine patches otc she can try and call the pain clinic to fu  [place]

## 2018-06-09 NOTE — Unmapped (Signed)
[facility] Clinical Assessment & Refill Coordination Note    Alyssa Willis, DOB: 1945/12/02  Phone: [phone] (home)     All above HIPAA information was verified with patient's family member. (spouse)     Specialty Medication(s):   CF/Pulmonary: -[name] (ivacaftor) 150mg  tablet     Current Outpatient Medications   Medication Sig Dispense Refill   ??? albuterol (PROVENTIL HFA;VENTOLIN HFA) 90 mcg/actuation inhaler Inhale 2 puffs every six (6) hours as needed for wheezing (and before hypertonic saline). Please dispense only Proventil HFA. 3 Inhaler 4   ??? CALCIUM CARBONATE (CALCIUM 500 ORAL) Take 1,000 mg by mouth daily.     ??? cholecalciferol, vitamin D3, 5,000 unit Tab Take 5,000 Int'l Units by mouth daily.     ??? cyproheptadine (PERIACTIN) 4 mg tablet Take 1 tablet (4 mg total) by mouth Three (3) times a day as needed. 270 tablet 1   ??? enalapril (VASOTEC) 5 MG tablet Take 2.5 mg by mouth daily.      ??? estradiol (ESTRACE) 0.01 % (0.1 mg/gram) vaginal cream Insert 0.5 g into the vagina Two (2) times a week. 42.5 g 4   ??? ivacaftor 150 mg Tab Take 1 tablet by mouth every twelve (12) hours. 56 tablet 11   ??? metoprolol succinate (TOPROL-XL) 25 MG 24 hr tablet Take 25 mg by mouth Two (2) times a day.      ??? multivit-min-FA-lycopen-lutein (CENTRUM SILVER) 0.4-300-250 mg-mcg-mcg Tab Take 1 tablet by mouth daily at 0600.      ??? nebulizer accessories (REUSABLE NEBULIZER KIT) Kit Please dispense one Pari LC Plus reusable nebulizer cup to be used with inhaled medications (hypertonic saline). Dx Code: J47.9, E84.0 1 kit 2   ??? PARI LC D NEBULIZER Misc Provide 1 device  for use with inhaled medication. 3 each 3   ??? peg-electrolyte soln (GOLYTELY) 420 gram SolR May substitute with Joselyn Arrow, Nulytely TriLyte. May use flavor Packets? Take as directed 1 Bottle 0   ??? sertraline (ZOLOFT) 100 MG tablet Take 100 mg by mouth daily.     ??? simvastatin (ZOCOR) 80 MG tablet Take 80 mg by mouth nightly. ??? sodium chloride 10 % Nebu inhale 4 ml via nebulizer once daily 750 mL 3   ??? zolpidem (AMBIEN CR) 6.25 MG CR tablet Take 6.25 mg by mouth nightly as needed for sleep.       No current facility-administered medications for this visit.         Changes to medications: [name] reports no changes reported at this time.    Allergies   Allergen Reactions   ??? Tobramycin Shortness Of Breath     wheezing   ??? Tobi [Tobramycin In 0.225 % Nacl] Other (See Comments)     wheezing  wheezing   ??? Codeine Itching and Nausea Only   ??? Galantamine Nausea Only   ??? Hydrocodone Itching       Changes to allergies: No    SPECIALTY MEDICATION ADHERENCE     Trikafta  na: 7 days of medicine on hand       Medication Adherence    Patient reported X missed doses in the last month:  0  Specialty Medication:  Trikafta  Informant:  significant other  Support network for adherence:  family member          Specialty medication(s) dose(s) confirmed: Regimen is correct and unchanged.     Are there any concerns with adherence? No    Adherence  counseling provided? Not needed    CLINICAL MANAGEMENT AND INTERVENTION      Clinical Benefit Assessment:    Do you feel the medicine is effective or helping your condition? Yes    Clinical Benefit counseling provided? Not needed    Adverse Effects Assessment:    Are you experiencing any side effects? No    Are you experiencing difficulty administering your medicine? No    Quality of Life Assessment:    How many days over the past month did your CF  keep you from your normal activities? For example, brushing your teeth or getting up in the morning. 0    Have you discussed this with your provider? Not needed    Therapy Appropriateness:    Is therapy appropriate? Yes, therapy is appropriate and should be continued    DISEASE/MEDICATION-SPECIFIC INFORMATION      For CF patients: CF Healthwell Grant Active? Yes    PATIENT SPECIFIC NEEDS     ? Does the patient have any physical, cognitive, or cultural barriers? No    ? Is the patient high risk? No     ? Does the patient require a Care Management Plan? No     ? Does the patient require physician intervention or other additional services (i.e. nutrition, smoking cessation, social work)? No      SHIPPING     Specialty Medication(s) to be Shipped:   CF/Pulmonary: -[name] (ivacaftor) 150mg  tablet    Other medication(s) to be shipped: n/a. Declined HTS 10% and 90 day supply     Changes to insurance: No    Delivery Scheduled: Yes, Expected medication delivery date: 06/13/2018.     Medication will be delivered via Next Day Courier to the confirmed home address in [facility].    The patient will receive a drug information handout for each medication shipped and additional FDA Medication Guides as required.  Verified that patient has previously received a Conservation officer, historic buildings.    Julianne Rice   [facility] Specialty Pharmacist

## 2018-06-12 MED FILL — KALYDECO 150 MG TABLET: 28 days supply | Qty: 56 | Fill #2 | Status: AC

## 2018-06-12 MED FILL — KALYDECO 150 MG TABLET: ORAL | 28 days supply | Qty: 56 | Fill #2

## 2018-06-16 ENCOUNTER — Telehealth: Payer: Self-pay | Admitting: Pain Medicine

## 2018-06-16 NOTE — Telephone Encounter (Signed)
Scheduled Monday at [employer].

## 2018-06-16 NOTE — Telephone Encounter (Signed)
Please schedule for a virtual appointment with Dr Dossie Arbour per Jocelyn Lamer

## 2018-06-16 NOTE — Telephone Encounter (Signed)
Per Husband call, patient is still in a lot of pain and wants to get something to help. Patient was told by pcp not to go out as she has underlying lung problems. Please call ?

## 2018-06-26 ENCOUNTER — Other Ambulatory Visit: Payer: Self-pay

## 2018-06-26 ENCOUNTER — Ambulatory Visit: Payer: Medicare Other | Attending: Pain Medicine | Admitting: Pain Medicine

## 2018-06-26 DIAGNOSIS — G4701 Insomnia due to medical condition: Secondary | ICD-10-CM

## 2018-06-26 DIAGNOSIS — M25512 Pain in left shoulder: Secondary | ICD-10-CM | POA: Diagnosis not present

## 2018-06-26 DIAGNOSIS — R7989 Other specified abnormal findings of blood chemistry: Secondary | ICD-10-CM

## 2018-06-26 DIAGNOSIS — R208 Other disturbances of skin sensation: Secondary | ICD-10-CM

## 2018-06-26 DIAGNOSIS — G894 Chronic pain syndrome: Secondary | ICD-10-CM | POA: Diagnosis not present

## 2018-06-26 DIAGNOSIS — M549 Dorsalgia, unspecified: Secondary | ICD-10-CM

## 2018-06-26 DIAGNOSIS — M79602 Pain in left arm: Secondary | ICD-10-CM | POA: Diagnosis not present

## 2018-06-26 DIAGNOSIS — M47894 Other spondylosis, thoracic region: Secondary | ICD-10-CM

## 2018-06-26 DIAGNOSIS — M810 Age-related osteoporosis without current pathological fracture: Secondary | ICD-10-CM

## 2018-06-26 DIAGNOSIS — M792 Neuralgia and neuritis, unspecified: Secondary | ICD-10-CM

## 2018-06-26 DIAGNOSIS — M47814 Spondylosis without myelopathy or radiculopathy, thoracic region: Secondary | ICD-10-CM

## 2018-06-26 DIAGNOSIS — M546 Pain in thoracic spine: Secondary | ICD-10-CM

## 2018-06-26 DIAGNOSIS — R12 Heartburn: Secondary | ICD-10-CM

## 2018-06-26 DIAGNOSIS — G8929 Other chronic pain: Secondary | ICD-10-CM

## 2018-06-26 MED ORDER — ESOMEPRAZOLE MAGNESIUM 20 MG PO CPDR: 20.0000 mg | DELAYED_RELEASE_CAPSULE | Freq: Every day | ORAL | 2 refills | Status: DC

## 2018-06-26 MED ORDER — GABAPENTIN 100 MG PO CAPS: 100.0000 mg | ORAL_CAPSULE | Freq: Every day | ORAL | 2 refills | Status: DC

## 2018-06-26 MED ORDER — MELATONIN 10 MG PO CAPS: 20.0000 mg | ORAL_CAPSULE | Freq: Every day | ORAL | 2 refills | Status: DC

## 2018-06-26 NOTE — Patient Instructions (Addendum)
____________________________________________________________________________________________  Initial Gabapentin Titration  Medication used: Gabapentin (Generic Name) or Neurontin (Brand Name) 100 mg tablets/capsules  Reasons to stop increasing the dose:  Reason 1: You get good relief of symptoms, in which case there is no need to increase the daily dose any further.    Reason 2: You develop some side effects, such as sleeping all of the time, difficulty concentrating, or becoming disoriented, in which case you need to go down on the dose, to the prior level, where you were not experiencing any side effects. Stay on that dose longer, to allow more time for your body to get use it, before attempting to increase it again.   Steps: Step 1: Start by taking 1 (one) tablet at bedtime x 7 (seven) days.  Step 2: After being on 1 (one) tablet for 7 (seven) days, then increase it to 2 (two) tablets at bedtime for another 7 (seven) days.  Step 3: Next, after being on 2 (two) tablets at bedtime for 7 (seven) days, then increase it to 3 (three) tablets at bedtime, and stay on that dose until you see your doctor.  Reasons to stop increasing the dose: Reason 1: You get good relief of symptoms, in which case there is no need to increase the daily dose any further.  Reason 2: You develop some side effects, such as sleeping all of the time, difficulty concentrating, or becoming disoriented, in which case you need to go down on the dose, to the prior level, where you were not experiencing any side effects. Stay on that dose longer, to allow more time for your body to get use it, before attempting to increase it again.  Endpoint: Once you have reached the maximum dose you can tolerate without side-effects, contact your physician so as to evaluate the results of the regimen.   Questions: Feel free to contact us for any questions or problems at [phone]  [phone] ____________________________________________________________________________________________  Take the following over-the-counter medications: 1. Melatonin 20 mg by mouth about 1 hour before bedtime. (for insomnia and pain) 2. Vitamin D3 5,000 IU by mouth every morning, along with 600 mg of Calcium and 500 mg of Magnesium. If you experience diarrhea, cut down the dose of Magnesium to 250 mg. (for insomnia, pain, muscle tension, and osteoporosis)  In addition take the following prescription medications:  1. Elavil (amytriptiline) 10 mg at bedtime. (helps with burning pain, and insomnia) 2. Meloxicam (Mobic) 15 mg in the morning with breakfast. 3. Nexium (omeprazole/magnesium) 20 mg at bedtime (for heartburn caused due to use of NSAIDs) 4. Neurontin (gabapentin) 100-300 mg at bedtime (This is to be increased slowly over time. Please see the above instructions) ( for neuropathic/nerve pain)

## 2018-06-26 NOTE — Progress Notes (Signed)
Pain Management Encounter Note - Virtual Visit via Telephone Telehealth (real-time audio visits between healthcare provider and patient).  Patient's Phone No. & Preferred Pharmacy:  [phone] (home); [phone] (mobile); (Preferred) [phone]  [place], [place] - [address] [place] [address] Phone: [phone] Fax: [phone]   Pre-screening note:  Our staff contacted Ms. Nordell and offered her an "in person", "face-to-face" appointment versus a telephone encounter. She indicated preferring the telephone encounter, at this time.  Reason for Virtual Visit: COVID-19*  Social distancing based on CDC and AMA recommendations.   I contacted Thurmon Fair on 06/26/2018 at 4:05 PM by telephone and clearly identified myself as Gaspar Cola, MD. I verified that I was speaking with the correct person using two identifiers (Name and date of birth: Feb 04, 1946).  Advanced Informed Consent I sought verbal advanced consent from Thurmon Fair for telemedicine interactions and virtual visit. I informed Ms. Hebb of the security and privacy concerns, risks, and limitations associated with performing an evaluation and management service by telephone. I also informed Ms. Lurry of the availability of "in person" appointments and I informed her of the possibility of a patient responsible charge related to this service. Ms. Mccain expressed understanding and agreed to proceed.   Historic Elements   Ms. ALAYHA BABINEAUX is a 73 y.o. year old, female patient evaluated today after her last encounter by our practice on 06/16/2018. Ms. 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 She also   has a past surgical history that includes Colonoscopy; Bronchoscopy; Nasal sinus surgery; Tonsillectomy; Eye surgery; Cataract extraction w/ intraocular lens  implant, bilateral (Bilateral); and Shoulder arthroscopy with open rotator cuff repair (Left, 09/15/2017). Ms. Finelli has a current medication list which includes the following prescription(s): amitriptyline, calcium citrate, cholecalciferol, duloxetine, enalapril, esomeprazole, estradiol, ezetimibe, gabapentin, galantamine, ivacaftor, melatonin, meloxicam, metoprolol succinate, multiple vitamins-minerals, fish oil, omeprazole, sertraline, simvastatin, sodium chloride, temazepam, and tizanidine. She  reports that she has never smoked. She has never used smokeless tobacco. She reports previous alcohol use. She reports that she does not use drugs. Ms. Ballard is allergic to tobramycin; codeine; galantamine; and hydrocodone.   HPI  I last saw her on 06/16/2018. She is being evaluated for a post-procedure assessment.  Post-Procedure Evaluation  Procedure: Diagnostic bilateral thoracic facet block #1 (T10, T11, T12, L1, & L2 Medial Branch) under fluoroscopic guidance and IV sedation Pre-procedure pain level:  6/10 Post-procedure: 0/10 (100% relief)  Sedation: Sedation provided.  Effectiveness during initial hour after procedure(Ultra-Short Term Relief): 100 %  Local anesthetic used: Long-acting (4-6 hours) Effectiveness: Defined as any analgesic benefit obtained secondary to the administration of local anesthetics. This carries significant diagnostic value as to the etiological location, or anatomical origin, of the pain. Duration of benefit is expected to coincide with the duration of the local anesthetic used.  Effectiveness during initial 4-6 hours after procedure(Short-Term Relief): 100 %  Long-term benefit: Defined as any relief past the pharmacologic duration of the local anesthetics.  Effectiveness past the initial 6 hours after  procedure(Long-Term Relief): 100 % x 24 hrs.  Current benefits: Defined as benefit that persist at this time.   Analgesia:  Back to baseline Function: Back to baseline ROM: Back to baseline  Review of recent tests  DG C-Arm 1-60 Min-No Report Fluoroscopy was utilized by the requesting physician.  No radiographic  interpretation.    Lab on 05/11/2018  Component Date Value Ref Range Status  . WBC 05/11/2018 9.0  4.0 - 10.5 K/uL Final  . RBC 05/11/2018 4.72  3.87 - 5.11 Mil/uL Final  . Hemoglobin 05/11/2018 13.4  12.0 - 15.0 g/dL Final  . HCT 05/11/2018 40.8  36.0 - 46.0 % Final  . MCV 05/11/2018 86.6  78.0 - 100.0 fl Final  . MCHC 05/11/2018 32.8  30.0 - 36.0 g/dL Final  . RDW 05/11/2018 15.7* 11.5 - 15.5 % Final  . Platelets 05/11/2018 290.0  150.0 - 400.0 K/uL Final  . Neutrophils Relative % 05/11/2018 64.5  43.0 - 77.0 % Final  . Lymphocytes Relative 05/11/2018 20.6  12.0 - 46.0 % Final  . Monocytes Relative 05/11/2018 7.1  3.0 - 12.0 % Final  . Eosinophils Relative 05/11/2018 6.6* 0.0 - 5.0 % Final  . Basophils Relative 05/11/2018 1.2  0.0 - 3.0 % Final  . Neutro Abs 05/11/2018 5.8  1.4 - 7.7 K/uL Final  . Lymphs Abs 05/11/2018 1.9  0.7 - 4.0 K/uL Final  . Monocytes Absolute 05/11/2018 0.6  0.1 - 1.0 K/uL Final  . Eosinophils Absolute 05/11/2018 0.6  0.0 - 0.7 K/uL Final  . Basophils Absolute 05/11/2018 0.1  0.0 - 0.1 K/uL Final  . Cholesterol 05/11/2018 161  0 - 200 mg/dL Final   ATP III Classification       Desirable:  < 200 mg/dL               Borderline High:  200 - 239 mg/dL          High:  > = 240 mg/dL  . Triglycerides 05/11/2018 321.0* 0.0 - 149.0 mg/dL Final   Normal:  <150 mg/dLBorderline High:  150 - 199 mg/dL  . HDL 05/11/2018 47.70  >39.00 mg/dL Final  . VLDL 05/11/2018 64.2* 0.0 - 40.0 mg/dL Final  . Total CHOL/HDL Ratio 05/11/2018 3   Final                  Men          Women1/2 Average Risk     3.4          3.3Average Risk          5.0          4.42X Average  Risk          9.6          7.13X Average Risk          15.0          11.0                      . NonHDL 05/11/2018 113.69   Final   NOTE:  Non-HDL goal should be 30 mg/dL higher than patient's LDL goal (i.e. LDL goal of < 70 mg/dL, would have non-HDL goal of < 100 mg/dL)  . Direct LDL 05/11/2018 86.0  mg/dL Final   Optimal:  <100 mg/dLNear or Above Optimal:  100-129 mg/dLBorderline High:  130-159 mg/dLHigh:  160-189 mg/dLVery High:  >190 mg/dL   Assessment  The primary encounter diagnosis was Chronic pain syndrome. Diagnoses of Chronic shoulder pain (Primary Area of Pain) (Left), Chronic upper extremity pain (Secondary Area of Pain) (Left), Chronic upper back pain (Bilateral), Chronic thoracic back pain (Bilateral), Thoracic facet syndrome (Bilateral), Allodynia, Neurogenic pain,  Insomnia secondary to chronic pain, Low serum vitamin D, Osteoporosis, unspecified osteoporosis type, unspecified pathological fracture presence, and Heartburn were also pertinent to this visit.  Plan of Care  I have changed Connye Burkitt. Weiss's gabapentin. I am also having her start on Melatonin and esomeprazole. Additionally, I am having her maintain her galantamine, metoprolol succinate, Ivacaftor, sertraline, Multiple Vitamins-Minerals (WOMENS MULTIVITAMIN PO), Calcium Citrate, Sodium Chloride, Fish Oil, Cholecalciferol, enalapril, temazepam, Estradiol, DULoxetine, tiZANidine, omeprazole, simvastatin, ezetimibe, amitriptyline, and meloxicam.  Pharmacotherapy (Medications Ordered): Meds ordered this encounter  Medications  . gabapentin (NEURONTIN) 100 MG capsule    Sig: Take 1-3 capsules (100-300 mg total) by mouth at bedtime.    Dispense:  90 capsule    Refill:  2    [place] STOP ACT - Not applicable to Chronic Pain Syndrome (G89.4) diagnosis. Fill one day early if pharmacy is closed on scheduled refill date. Do not fill until: 06/26/18. To last until: 09/24/18.  . Melatonin 10 MG CAPS    Sig: Take 20 mg by mouth at  bedtime.    Dispense:  60 capsule    Refill:  2    Do not place medication on "Automatic Refill". Fill one day early if pharmacy is closed on scheduled refill date.  . esomeprazole (NEXIUM) 20 MG capsule    Sig: Take 1 capsule (20 mg total) by mouth at bedtime.    Dispense:  30 capsule    Refill:  2    Do not place medication on "Automatic Refill". Fill one day early if pharmacy is closed on scheduled refill date.   Orders:  No orders of the defined types were placed in this encounter.  Follow-up plan:   Return for Med-Mgmt w/ NP, (Virtual).   Interventional management options: Planned, scheduled, and/or pending:   Possible candidate for facet RFA.   Considering:   Diagnostic (Midline) cervicalESI Diagnostic bilateral cervical facet blocks Possible bilateral cervical facet RFA Diagnostic left supraspinatus tendon sheath injection Diagnostic left infraspinatus tendon sheath injection Diagnostic left subacromial bursa injection Diagnostic left subdeltoid bursa injection Diagnostic left acromioclavicular joint injection Diagnostic left suprascapular nerve block Possible left suprascapularRFA Diagnostic bilateral lumbar facet block Possible bilateral lumbar facet RFA   Palliative PRN treatment(s):   None at this time   I discussed the assessment and treatment plan with the patient. The patient was provided an opportunity to ask questions and all were answered. The patient agreed with the plan and demonstrated an understanding of the instructions.  Patient advised to call back or seek an in-person evaluation if the symptoms or condition worsens.  Total duration of non-face-to-face encounter: 30 minutes.  Note by: Gaspar Cola, MD Date: 06/26/2018; Time: 4:05 PM  Disclaimer:  * Given the special circumstances of the COVID-19 pandemic, the federal government has announced that [employer] for Civil Rights ([employer]) will exercise its enforcement discretion and  will not impose penalties on physicians using telehealth in the event of noncompliance with regulatory requirements under the [place] and Accountability Act (HIPAA) in connection with the good faith provision of telehealth during the GYIRS-85 national public health emergency. ([place])

## 2018-07-04 NOTE — Unmapped (Signed)
[facility] Refill Coordination Note    Specialty Medication(s) to be Shipped: N/A      Other medication(s) to be shipped: N/A    Declined: [name], has enough on hand     Alyssa Willis, DOB: October 14, 1945  Phone: [phone] (home)       All above HIPAA information was verified with patient's family member.     Completed refill call assessment today to schedule patient's medication shipment from the [facility] [phone]).       Specialty medication(s) and dose(s) confirmed: Regimen is correct and unchanged.   Changes to medications: Alyssa Willis reports no changes reported at this time.  Changes to insurance: No  Questions for the pharmacist: No    Confirmed patient received Welcome Packet with first shipment. The patient will receive a drug information handout for each medication shipped and additional FDA Medication Guides as required.       DISEASE/MEDICATION-SPECIFIC INFORMATION        For CF patients: CF Healthwell Grant Active? Yes    SPECIALTY MEDICATION ADHERENCE     Medication Adherence    Patient reported X missed doses in the last month:  0  Specialty Medication:  kalydeco  Patient is on additional specialty medications:  No  Patient is on more than two specialty medications:  No  Any gaps in refill history greater than 2 weeks in the last 3 months:  no  Support network for adherence:  family member                kalydeco 150 mg: 30 days of medicine on hand         SHIPPING     Shipping address confirmed in Epic.     Delivery Scheduled: Yes, Expected medication delivery date: N/A.     Medication will be delivered via N/A to the N/A address in Epic [place].    Alyssa Willis   [facility] Specialty Technician

## 2018-07-19 ENCOUNTER — Encounter: Payer: Self-pay | Admitting: Pain Medicine

## 2018-08-02 NOTE — Unmapped (Signed)
[facility] Refill Coordination Note    Specialty Medication(s) to be Shipped:   CF/Pulmonary: -[name] (ivacaftor) 150mg  tablet    Other medication(s) to be shipped: N/A     Alyssa Willis, DOB: Feb 20, 1946  Phone: [phone] (home)       All above HIPAA information was verified with patient's family member.     Completed refill call assessment today to schedule patient's medication shipment from the [facility] [phone]).       Specialty medication(s) and dose(s) confirmed: Regimen is correct and unchanged.   Changes to medications: Alyssa Willis reports no changes reported at this time.  Changes to insurance: No  Questions for the pharmacist: No    Confirmed patient received Welcome Packet with first shipment. The patient will receive a drug information handout for each medication shipped and additional FDA Medication Guides as required.       DISEASE/MEDICATION-SPECIFIC INFORMATION        For CF patients: CF Healthwell Grant Active? Yes    SPECIALTY MEDICATION ADHERENCE     Medication Adherence    Patient reported X missed doses in the last month:  0  Specialty Medication:  kalydeco  Patient is on additional specialty medications:  No  Patient is on more than two specialty medications:  No  Any gaps in refill history greater than 2 weeks in the last 3 months:  no  Support network for adherence:  family member                kalydeco 150 mg: 6 days of medicine on hand         SHIPPING     Shipping address confirmed in Epic.     Delivery Scheduled: Yes, Expected medication delivery date: 08/04/2018.     Medication will be delivered via Next Day Courier to the home address in [employer] [place].    Alyssa Willis   [facility] Specialty Technician

## 2018-08-03 MED FILL — KALYDECO 150 MG TABLET: 28 days supply | Qty: 56 | Fill #3 | Status: AC

## 2018-08-03 MED FILL — KALYDECO 150 MG TABLET: ORAL | 28 days supply | Qty: 56 | Fill #3

## 2018-08-05 ENCOUNTER — Other Ambulatory Visit: Payer: Self-pay | Admitting: Pain Medicine

## 2018-08-05 DIAGNOSIS — R12 Heartburn: Secondary | ICD-10-CM

## 2018-08-06 ENCOUNTER — Other Ambulatory Visit: Payer: Self-pay | Admitting: Pain Medicine

## 2018-08-06 DIAGNOSIS — M792 Neuralgia and neuritis, unspecified: Secondary | ICD-10-CM

## 2018-08-06 DIAGNOSIS — M159 Polyosteoarthritis, unspecified: Secondary | ICD-10-CM

## 2018-08-07 ENCOUNTER — Other Ambulatory Visit: Payer: Self-pay | Admitting: Pain Medicine

## 2018-08-07 DIAGNOSIS — R12 Heartburn: Secondary | ICD-10-CM

## 2018-08-07 MED ORDER — CYPROHEPTADINE 4 MG TABLET
0 refills | 0 days | Status: CP
Start: 2018-08-07 — End: ?

## 2018-08-08 ENCOUNTER — Telehealth: Payer: Self-pay | Admitting: Internal Medicine

## 2018-08-08 NOTE — Telephone Encounter (Signed)
Vitamin D3 rec 5000 IU daily over the counter  Does not need weekly high dose vitamin D3 for now    Schedule f/u w/in the next 1-2 month via telephone or virtual visit   Flanagan [place]

## 2018-08-09 NOTE — Telephone Encounter (Signed)
Called and spoke with spouse and informed him that the pt is to stop the weekly dose of vitamin d3 and to go to  [facility] vitamin d3 5000 units, he understood also stated the pt would call back to schedule a visit in 1-2 months.  Braiden Rodman,cma

## 2018-08-15 ENCOUNTER — Ambulatory Visit: Payer: Medicare Other | Admitting: Internal Medicine

## 2018-09-01 NOTE — Unmapped (Signed)
[facility] [place] [facility] Clinical Assessment & Refill Coordination Note    Alyssa Willis, DOB: 25-Feb-1946  Phone: [phone] (home)     All above HIPAA information was verified with patient's family member. (husband)     Specialty Medication(s):   CF/Pulmonary: -[name] (ivacaftor) 150mg  tablet     Current Outpatient Medications   Medication Sig Dispense Refill   ??? albuterol (PROVENTIL HFA;VENTOLIN HFA) 90 mcg/actuation inhaler Inhale 2 puffs every six (6) hours as needed for wheezing (and before hypertonic saline). Please dispense only Proventil HFA. 3 Inhaler 4   ??? CALCIUM CARBONATE (CALCIUM 500 ORAL) Take 1,000 mg by mouth daily.     ??? cholecalciferol, vitamin D3, 5,000 unit Tab Take 5,000 Int'l Units by mouth daily.     ??? cyproheptadine (PERIACTIN) 4 mg tablet TAKE 1 TABLET BY MOUTH THREE TIMES DAILY AS NEEDED 270 each 0   ??? enalapril (VASOTEC) 5 MG tablet Take 2.5 mg by mouth daily.      ??? estradiol (ESTRACE) 0.01 % (0.1 mg/gram) vaginal cream Insert 0.5 g into the vagina Two (2) times a week. 42.5 g 4   ??? ivacaftor 150 mg Tab Take 1 tablet by mouth every twelve (12) hours. 56 tablet 11   ??? metoprolol succinate (TOPROL-XL) 25 MG 24 hr tablet Take 25 mg by mouth Two (2) times a day.      ??? multivit-min-FA-lycopen-lutein (CENTRUM SILVER) 0.4-300-250 mg-mcg-mcg Tab Take 1 tablet by mouth daily at 0600.      ??? nebulizer accessories (REUSABLE NEBULIZER KIT) Kit Please dispense one Pari LC Plus reusable nebulizer cup to be used with inhaled medications (hypertonic saline). Dx Code: J47.9, E84.0 1 kit 2   ??? PARI LC D NEBULIZER Misc Provide 1 device  for use with inhaled medication. 3 each 3   ??? peg-electrolyte soln (GOLYTELY) 420 gram SolR May substitute with Joselyn Arrow, Nulytely TriLyte. May use flavor Packets? Take as directed 1 Bottle 0   ??? sertraline (ZOLOFT) 100 MG tablet Take 100 mg by mouth daily.     ??? simvastatin (ZOCOR) 80 MG tablet Take 80 mg by mouth nightly.     ??? sodium chloride 10 % Nebu inhale 4 ml via nebulizer once daily 750 mL 3   ??? zolpidem (AMBIEN CR) 6.25 MG CR tablet Take 6.25 mg by mouth nightly as needed for sleep.       No current facility-administered medications for this visit.         Changes to medications: [name] reports no changes at this time.    Allergies   Allergen Reactions   ??? Tobramycin Shortness Of Breath     wheezing   ??? Tobi [Tobramycin In 0.225 % Nacl] Other (See Comments)     wheezing  wheezing   ??? Codeine Itching and Nausea Only   ??? Galantamine Nausea Only   ??? Hydrocodone Itching       Changes to allergies: No    SPECIALTY MEDICATION ADHERENCE     Kalydeoc 150 mg: 5 days of medicine on hand       Medication Adherence    Patient reported X missed doses in the last month:  0  Specialty Medication:  [name] 150mg    Patient is on additional specialty medications:  No  Informant:  spouse  Support network for adherence:  family member          Specialty medication(s) dose(s) confirmed: Regimen is correct and unchanged.     Are there any  concerns with adherence? No    Adherence counseling provided? Not needed    CLINICAL MANAGEMENT AND INTERVENTION      Clinical Benefit Assessment:    Do you feel the medicine is effective or helping your condition? Yes    Clinical Benefit counseling provided? Not needed    Adverse Effects Assessment:    Are you experiencing any side effects? No    Are you experiencing difficulty administering your medicine? No    Quality of Life Assessment:    How many days over the past month did your CF  keep you from your normal activities? For example, brushing your teeth or getting up in the morning. 0    Have you discussed this with your provider? Not needed    Therapy Appropriateness:    Is therapy appropriate? Yes, therapy is appropriate and should be continued    DISEASE/MEDICATION-SPECIFIC INFORMATION      For CF patients: CF Healthwell [place] Active? Yes    PATIENT SPECIFIC NEEDS     ? Does the patient have any physical, cognitive, or cultural barriers? No    ? Is the patient high risk? No     ? Does the patient require a Care Management Plan? No     ? Does the patient require physician intervention or other additional services (i.e. nutrition, smoking cessation, social work)? No      SHIPPING     Specialty Medication(s) to be Shipped:   CF/Pulmonary: -[name] (ivacaftor) 150mg  tablet    Other medication(s) to be shipped: n/a     Changes to insurance: No    Delivery Scheduled: Yes, Expected medication delivery date: 09/05/18.     Medication will be delivered via Next Day Courier to the confirmed home address in [facility].    The patient will receive a drug information handout for each medication shipped and additional FDA Medication Guides as required.  Verified that patient has previously received a Conservation officer, historic buildings.    All of the patient's questions and concerns have been addressed.    Julianne Rice   [facility] Specialty Pharmacist

## 2018-09-04 MED FILL — KALYDECO 150 MG TABLET: 28 days supply | Qty: 56 | Fill #4 | Status: AC

## 2018-09-04 MED FILL — KALYDECO 150 MG TABLET: ORAL | 28 days supply | Qty: 56 | Fill #4

## 2018-09-06 ENCOUNTER — Other Ambulatory Visit: Payer: Self-pay | Admitting: Pain Medicine

## 2018-09-06 DIAGNOSIS — M792 Neuralgia and neuritis, unspecified: Secondary | ICD-10-CM

## 2018-09-27 NOTE — Unmapped (Signed)
[facility] Refill Coordination Note    Specialty Medication(s) to be Shipped:   CF/Pulmonary: -[name] (ivacaftor) 150mg  tablet    Other medication(s) to be shipped:   -0     Alyssa Willis. [place]  DOB: Oct 26, 1945  Phone: [phone] (home)   Shipping Address: [address]  [place] [address]    All above HIPAA information was verified with patient.     Completed refill call assessment today to schedule patient's medication shipment from the [facility] [phone]).       Specialty medication(s) and dose(s) confirmed: Regimen is correct and unchanged.   Changes to medications: [name] reports no changes reported at this time.  Changes to insurance: No  Questions for the pharmacist: No    Confirmed patient received Welcome Packet with first shipment. The patient will receive a drug information handout for each medication shipped and additional FDA Medication Guides as required.       DISEASE/MEDICATION-SPECIFIC INFORMATION        For CF patients: CF Healthwell [facility] Active? Yes    Medication Assistance Program    Was Copay Assistance Provided:  Yes  Was Kennedy Bucker Assistance Provided:  Yes  Medication Program Assistance Provided(If Applicable):  HWG VST ACTIVE EXP 02/18/19          SPECIALTY MEDICATION ADHERENCE     Medication Adherence    Patient reported X missed doses in the last month:  0  Specialty Medication:  [name] 150MG  (7 DAYS)   Patient is on additional specialty medications:  No  Informant:  spouse  Reliability of informant:  reliable  Support network for adherence:  family member  Confirmed plan for next specialty medication refill:  delivery by pharmacy  Refills needed for supportive medications:  not needed          Refill Coordination    Has the Patients' Contact Information Changed:  No  Is the Shipping Address Different:  No                   [name]  150 mg: 7 days of medicine on hand         ADDITIONAL NOTES         PER HUSBAND DOES NOT NEED HTS AT THIS TIME        SHIPPING     Shipping Information    Delivery Scheduled:  Yes  Delivery Date:  10/02/18         Shipping address confirmed in Epic.     Delivery Scheduled: Yes, Expected medication delivery date: 7/13.     Medication will be delivered via Same Day Courier to the home address in Epic WAM.    [facility]   [facility] Shared [facility] Specialty Technician

## 2018-10-02 MED FILL — KALYDECO 150 MG TABLET: ORAL | 28 days supply | Qty: 56 | Fill #5

## 2018-10-02 MED FILL — KALYDECO 150 MG TABLET: 28 days supply | Qty: 56 | Fill #5 | Status: AC

## 2018-10-05 ENCOUNTER — Other Ambulatory Visit: Payer: Self-pay | Admitting: Internal Medicine

## 2018-10-05 DIAGNOSIS — Z1231 Encounter for screening mammogram for malignant neoplasm of breast: Secondary | ICD-10-CM

## 2018-10-09 MED ORDER — VENTOLIN HFA 90 MCG/ACTUATION AEROSOL INHALER
0 refills | 0 days | Status: CP
Start: 2018-10-09 — End: ?

## 2018-10-11 ENCOUNTER — Encounter: Payer: Self-pay | Admitting: Pain Medicine

## 2018-10-11 NOTE — Progress Notes (Signed)
Dr Dossie Arbour, I did patient reconciliation, there are several medications at the top that state, "patient requested removal".  Patient states she did not do that and states she does not recognize the drugs.  She asked to call me back after she reviewed her medications and there are some of the ones from the top of the list that she did report.  Unsure if this a great report but the ones on the bottom that report "taking" are correct.

## 2018-10-12 ENCOUNTER — Ambulatory Visit: Payer: Medicare Other | Attending: Pain Medicine | Admitting: Pain Medicine

## 2018-10-12 ENCOUNTER — Other Ambulatory Visit: Payer: Self-pay

## 2018-10-12 DIAGNOSIS — G8929 Other chronic pain: Secondary | ICD-10-CM | POA: Diagnosis not present

## 2018-10-12 DIAGNOSIS — R12 Heartburn: Secondary | ICD-10-CM | POA: Diagnosis not present

## 2018-10-12 DIAGNOSIS — M792 Neuralgia and neuritis, unspecified: Secondary | ICD-10-CM

## 2018-10-12 DIAGNOSIS — M159 Polyosteoarthritis, unspecified: Secondary | ICD-10-CM

## 2018-10-12 DIAGNOSIS — M150 Primary generalized (osteo)arthritis: Secondary | ICD-10-CM | POA: Diagnosis not present

## 2018-10-12 DIAGNOSIS — G4701 Insomnia due to medical condition: Secondary | ICD-10-CM

## 2018-10-12 MED ORDER — GABAPENTIN 100 MG PO CAPS: 100.0000 mg | ORAL_CAPSULE | Freq: Every day | ORAL | 5 refills | Status: AC

## 2018-10-12 MED ORDER — MELATONIN 10 MG PO CAPS: 20.0000 mg | ORAL_CAPSULE | Freq: Every day | ORAL | 5 refills | Status: AC

## 2018-10-12 MED ORDER — ESOMEPRAZOLE MAGNESIUM 20 MG PO CPDR: 20.0000 mg | DELAYED_RELEASE_CAPSULE | Freq: Every day | ORAL | 5 refills | Status: AC

## 2018-10-12 MED ORDER — AMITRIPTYLINE HCL 10 MG PO TABS: 10.0000 mg | ORAL_TABLET | Freq: Every day | ORAL | 5 refills | Status: AC

## 2018-10-12 MED ORDER — MELOXICAM 15 MG PO TABS: 15.0000 mg | ORAL_TABLET | Freq: Every day | ORAL | 5 refills | Status: AC

## 2018-10-12 NOTE — Progress Notes (Signed)
Pain Management Virtual Encounter Note - Virtual Visit via Telephone Telehealth (real-time audio visits between healthcare provider and patient).   Patient's Phone No. & Preferred Pharmacy:  [phone] (home); [phone] (mobile); (Preferred) [phone] [email or web address]  [facility] [place] [address] [place] [address] Phone: [phone] Fax: [phone]    Pre-screening note:  Our staff contacted Brenda Clarke and offered her an "in person", "face-to-face" appointment versus a telephone encounter. She indicated preferring the telephone encounter, at this time.   Reason for Virtual Visit: COVID-19*  Social distancing based on CDC and AMA recommendations.   I contacted Brenda Clarke on 10/12/2018 via telephone.      I clearly identified myself as Gaspar Cola, MD. I verified that I was speaking with the correct person using two identifiers (Name: Brenda [place], and date of birth: 06/11/1945).  Advanced Informed Consent I sought verbal advanced consent from Brenda Clarke for virtual visit interactions. I informed Brenda Clarke of possible security and privacy concerns, risks, and limitations associated with providing "not-in-person" medical evaluation and management services. I also informed Brenda Clarke of the availability of "in-person" appointments. Finally, I informed her that there would be a charge for the virtual visit and that she could be  personally, fully or partially, financially responsible for it. Brenda Clarke expressed understanding and agreed to proceed.   Historic Elements   Brenda Clarke is a 73 y.o. year old, female patient evaluated today after her last encounter by our practice on 09/06/2018. 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 She also  has a past surgical history that includes Colonoscopy; Bronchoscopy; Nasal sinus surgery; Tonsillectomy; Eye surgery; Cataract extraction w/ intraocular lens  implant, bilateral (Bilateral); and Shoulder arthroscopy with open rotator cuff repair (Left, 09/15/2017). Brenda Clarke has a current medication list which includes the following prescription(s): amitriptyline, calcium citrate, cyproheptadine, duloxetine, enalapril, ezetimibe, ivacaftor, meloxicam, metoprolol succinate, multiple vitamins-minerals, sertraline, simvastatin, sodium chloride, temazepam, cholecalciferol, esomeprazole, estradiol, gabapentin, galantamine, melatonin, omeprazole, sertraline, and tizanidine. She  reports that she has never smoked. She has never used smokeless tobacco. She reports previous alcohol use. She reports that she does not use drugs. Brenda Clarke is allergic to tobramycin; codeine; galantamine; and hydrocodone.   HPI  Today, she is being contacted for medication management.  Pharmacotherapy Assessment  Analgesic:  No opioid analgesics prescribed at our practice.    Monitoring: Pharmacotherapy: No side-effects or adverse reactions reported. [place] PMP: PDMP reviewed during this encounter.       Compliance: No problems identified. Effectiveness: Clinically acceptable. Plan: Refer to "POC".  Pertinent Labs   SAFETY SCREENING Profile No results found for: SARSCOV2NAA, COVIDSOURCE, STAPHAUREUS, MRSAPCR, HCVAB, HIV, PREGTESTUR Renal Function Lab Results  Component Value Date   BUN 20 11/17/2017   CREATININE 0.86 11/17/2017   GFRAA >60 11/17/2017   GFRNONAA >60 11/17/2017   Hepatic Function Lab Results  Component Value Date   AST 27 11/17/2017   ALT 16 11/17/2017   ALBUMIN 4.2 11/17/2017   UDS Summary  Date Value Ref Range Status  12/01/2017 FINAL  Final    Comment:     ==================================================================== TOXASSURE COMP DRUG ANALYSIS,UR ==================================================================== Test  Result       Flag       Units Drug Present and Declared for Prescription Verification   Sertraline                     PRESENT      EXPECTED   Desmethylsertraline            PRESENT      EXPECTED    Desmethylsertraline is an expected metabolite of sertraline.   Metoprolol                     PRESENT      EXPECTED Drug Present not Declared for Prescription Verification   Oxazepam                       >1099        UNEXPECTED ng/mg creat   Temazepam                      >1099        UNEXPECTED ng/mg creat    Oxazepam and temazepam are expected metabolites of diazepam.    Oxazepam is also an expected metabolite of other benzodiazepine    drugs, including chlordiazepoxide, prazepam, clorazepate,    halazepam, and temazepam.  Oxazepam and temazepam are available    as scheduled prescription medications.   Acetaminophen                  PRESENT      UNEXPECTED   Salicylate                     PRESENT      UNEXPECTED Drug Absent but Declared for Prescription Verification   Zolpidem                       Not Detected UNEXPECTED    Zolpidem, as indicated in the declared medication list, is not    always detected even when used as directed. ==================================================================== Test                      Result    Flag   Units      Ref Range   Creatinine              182              mg/dL      >=20 ==================================================================== Declared Medications:  The flagging and interpretation on this report are based on the  following declared medications.  Unexpected results may arise from  inaccuracies in the declared medications.  **Note: The testing scope of this panel includes these medications:  Metoprolol (Toprol)  Sertraline  (Zoloft)  **Note: The testing scope of this panel does not include small to  moderate amounts of these reported medications:  Zolpidem (Ambien)  **Note: The testing scope of this panel does not include following  reported medications:  Calcium citrate  Enalapril (Vasotec)  Galantamine ([place])  Ivacaftor  Multivitamin (MVI)  Omega-3 Fatty Acids ([place])  Omeprazole (Prilosec)  Ranitidine (Zantac)  Risedronate (Actonel)  Simvastatin (Zocor)  Sodium Chloride  Vitamin D3 ==================================================================== For clinical consultation, please call [phone]. ====================================================================    Note: Above Lab results reviewed.  Recent imaging  DG C-Arm 1-60 Min-No Report Fluoroscopy was utilized by the requesting physician.  No radiographic  interpretation.   Assessment  Diagnoses of  Heartburn, Insomnia secondary to chronic pain, Neurogenic pain, and Osteoarthritis involving multiple joints were pertinent to this visit.  Plan of Care  I have discontinued Brenda [place]. I am also having her maintain her galantamine, metoprolol succinate, Ivacaftor, sertraline, Multiple Vitamins-Minerals (WOMENS MULTIVITAMIN PO), Calcium Citrate, Sodium Chloride, Cholecalciferol, enalapril, temazepam, Estradiol, DULoxetine, tiZANidine, omeprazole, simvastatin, ezetimibe, cyproheptadine, sertraline, esomeprazole, Melatonin, amitriptyline, gabapentin, and meloxicam.  Pharmacotherapy (Medications Ordered): Meds ordered this encounter  Medications  . esomeprazole (NEXIUM) 20 MG capsule    Sig: Take 1 capsule (20 mg total) by mouth at bedtime.    Dispense:  30 capsule    Refill:  5    Fill one day early if pharmacy is closed on scheduled refill date. May substitute for generic if available.  . Melatonin 10 MG CAPS    Sig: Take 20 mg by mouth at bedtime.    Dispense:  60 capsule    Refill:  5    Fill one day  early if pharmacy is closed on scheduled refill date. May substitute for generic if available.  Marland Kitchen amitriptyline (ELAVIL) 10 MG tablet    Sig: Take 1 tablet (10 mg total) by mouth at bedtime.    Dispense:  30 tablet    Refill:  5    Fill one day early if pharmacy is closed on scheduled refill date. May substitute for generic if available.  . gabapentin (NEURONTIN) 100 MG capsule    Sig: Take 1-3 capsules (100-300 mg total) by mouth at bedtime.    Dispense:  90 capsule    Refill:  5    Fill one day early if pharmacy is closed on scheduled refill date. May substitute for generic if available.  . meloxicam (MOBIC) 15 MG tablet    Sig: Take 1 tablet (15 mg total) by mouth daily.    Dispense:  30 tablet    Refill:  5    Fill one day early if pharmacy is closed on scheduled refill date. May substitute for generic if available.   Orders:  No orders of the defined types were placed in this encounter.  Follow-up plan:   Return in about 6 months (around 04/09/2019) for (VV), E/M (MM).      Interventional management options:  Considering: Diagnostic (Midline) cervicalESI Diagnostic bilateral cervical facet blocks Possible bilateral cervical facet RFA Diagnostic left supraspinatus tendon sheath injection Diagnostic left infraspinatus tendon sheath injection Diagnostic left subacromial bursa injection Diagnostic left subdeltoid bursa injection Diagnostic left acromioclavicular joint injection Diagnostic left suprascapular nerve block Possible left suprascapularRFA Diagnostic bilateral lumbar facet block Possible bilateral lumbar facet RFA Possible bilateral thoracic facet RFA    Palliative PRN treatment(s): Palliative bilateral thoracic facet block #2 (T2, T3, T4, T5, T6, & T7  MBB)(100/100/50/50-75) Palliative bilateral thoracic facet block #2 (T10, T11, T12, L1, & L2 MBB)(100/100/100x24hrs)    Recent Visits No visits were found meeting these conditions.  Showing  recent visits within past 90 days and meeting all other requirements   Today's Visits Date Type Provider Dept  10/12/18 Office Visit Milinda Pointer, MD [facility]  Showing today's visits and meeting all other requirements   Future Appointments No visits were found meeting these conditions.  Showing future appointments within next 90 days and meeting all other requirements   I discussed the assessment and treatment plan with the patient. The patient was provided an opportunity to ask questions and all were answered. The patient agreed with the plan and demonstrated an understanding  of the instructions.  Patient advised to call back or seek an in-person evaluation if the symptoms or condition worsens.  Total duration of non-face-to-face encounter: 12 minutes.  Note by: Gaspar Cola, MD Date: 10/12/2018; Time: 10:18 AM  Note: This dictation was prepared with Dragon dictation. Any transcriptional errors that may result from this process are unintentional.  Disclaimer:  * Given the special circumstances of the COVID-19 pandemic, the federal government has announced that the Office for Civil Rights (OCR) will exercise its enforcement discretion and will not impose penalties on physicians using telehealth in the event of noncompliance with regulatory requirements under the [place] (HIPAA) in connection with the good faith provision of telehealth during the UJWJX-91 national public health emergency. ([place])

## 2018-10-12 NOTE — Patient Instructions (Signed)
____________________________________________________________________________________________  Medication Rules  Purpose: To inform patients, and their family members, of our rules and regulations.  Applies to: All patients receiving prescriptions (written or electronic).  Pharmacy of record: Pharmacy where electronic prescriptions will be sent. If written prescriptions are taken to a different pharmacy, please inform the nursing staff. The pharmacy listed in the electronic medical record should be the one where you would like electronic prescriptions to be sent.  Electronic prescriptions: In compliance with the [place] Strengthen Opioid Misuse Prevention (STOP) Act of 2017 (Session Law 2017-74/H243), effective March 22, 2018, all controlled substances must be electronically prescribed. Calling prescriptions to the pharmacy will cease to exist.  Prescription refills: Only during scheduled appointments. Applies to all prescriptions.  NOTE: The following applies primarily to controlled substances (Opioid* Pain Medications).   Patient's responsibilities: 1. Pain Pills: Bring all pain pills to every appointment (except for procedure appointments). 2. Pill Bottles: Bring pills in original pharmacy bottle. Always bring the newest bottle. Bring bottle, even if empty. 3. Medication refills: You are responsible for knowing and keeping track of what medications you take and those you need refilled. The day before your appointment: write a list of all prescriptions that need to be refilled. The day of the appointment: give the list to the admitting nurse. Prescriptions will be written only during appointments. No prescriptions will be written on procedure days. If you forget a medication: it will not be "Called in", "Faxed", or "electronically sent". You will need to get another appointment to get these prescribed. No early refills. Do not call asking to have your prescription filled  early. 4. Prescription Accuracy: You are responsible for carefully inspecting your prescriptions before leaving our office. Have the discharge nurse carefully go over each prescription with you, before taking them home. Make sure that your name is accurately spelled, that your address is correct. Check the name and dose of your medication to make sure it is accurate. Check the number of pills, and the written instructions to make sure they are clear and accurate. Make sure that you are given enough medication to last until your next medication refill appointment. 5. Taking Medication: Take medication as prescribed. When it comes to controlled substances, taking less pills or less frequently than prescribed is permitted and encouraged. Never take more pills than instructed. Never take medication more frequently than prescribed.  6. Inform other Doctors: Always inform, all of your healthcare providers, of all the medications you take. 7. Pain Medication from other Providers: You are not allowed to accept any additional pain medication from any other Doctor or Healthcare provider. There are two exceptions to this rule. (see below) In the event that you require additional pain medication, you are responsible for notifying us, as stated below. 8. Medication Agreement: You are responsible for carefully reading and following our Medication Agreement. This must be signed before receiving any prescriptions from our practice. Safely store a copy of your signed Agreement. Violations to the Agreement will result in no further prescriptions. (Additional copies of our Medication Agreement are available upon request.) 9. Laws, Rules, & Regulations: All patients are expected to follow all Federal and State Laws, Statutes, Rules, & Regulations. Ignorance of the Laws does not constitute a valid excuse. The use of any illegal substances is prohibited. 10. Adopted CDC guidelines & recommendations: Target dosing levels will be  at or below 60 MME/day. Use of benzodiazepines** is not recommended.  Exceptions: There are only two exceptions to the rule of not   receiving pain medications from other Healthcare Providers. 1. Exception #1 (Emergencies): In the event of an emergency (i.e.: accident requiring emergency care), you are allowed to receive additional pain medication. However, you are responsible for: As soon as you are able, call our office [phone], at any time of the day or night, and leave a message stating your name, the date and nature of the emergency, and the name and dose of the medication prescribed. In the event that your call is answered by a member of our staff, make sure to document and save the date, time, and the name of the person that took your information.  2. Exception #2 (Planned Surgery): In the event that you are scheduled by another doctor or dentist to have any type of surgery or procedure, you are allowed (for a period no longer than 30 days), to receive additional pain medication, for the acute post-op pain. However, in this case, you are responsible for picking up a copy of our "Post-op Pain Management for Surgeons" handout, and giving it to your surgeon or dentist. This document is available at our office, and does not require an appointment to obtain it. Simply go to our office during business hours (Monday-Thursday from 8:00 AM to 4:00 PM) (Friday 8:00 AM to 12:00 Noon) or if you have a scheduled appointment with us, prior to your surgery, and ask for it by name. In addition, you will need to provide us with your name, name of your surgeon, type of surgery, and date of procedure or surgery.  *Opioid medications include: morphine, codeine, oxycodone, oxymorphone, hydrocodone, hydromorphone, meperidine, tramadol, tapentadol, buprenorphine, fentanyl, methadone. **Benzodiazepine medications include: diazepam (Valium), alprazolam (Xanax), clonazepam (Klonopine), lorazepam (Ativan), clorazepate  (Tranxene), chlordiazepoxide (Librium), estazolam (Prosom), oxazepam (Serax), temazepam (Restoril), triazolam (Halcion) (Last updated: 05/19/2017) ____________________________________________________________________________________________   ____________________________________________________________________________________________  Medication Recommendations and Reminders  Applies to: All patients receiving prescriptions (written and/or electronic).  Medication Rules & Regulations: These rules and regulations exist for your safety and that of others. They are not flexible and neither are we. Dismissing or ignoring them will be considered "non-compliance" with medication therapy, resulting in complete and irreversible termination of such therapy. (See document titled "Medication Rules" for more details.) In all conscience, because of safety reasons, we cannot continue providing a therapy where the patient does not follow instructions.  Pharmacy of record:   Definition: This is the pharmacy where your electronic prescriptions will be sent.   We do not endorse any particular pharmacy.  You are not restricted in your choice of pharmacy.  The pharmacy listed in the electronic medical record should be the one where you want electronic prescriptions to be sent.  If you choose to change pharmacy, simply notify our nursing staff of your choice of new pharmacy.  Recommendations:  Keep all of your pain medications in a safe place, under lock and key, even if you live alone.   After you fill your prescription, take 1 week's worth of pills and put them away in a safe place. You should keep a separate, properly labeled bottle for this purpose. The remainder should be kept in the original bottle. Use this as your primary supply, until it runs out. Once it's gone, then you know that you have 1 week's worth of medicine, and it is time to come in for a prescription refill. If you do this correctly, it  is unlikely that you will ever run out of medicine.  To make sure that the above recommendation works,   it is very important that you make sure your medication refill appointments are scheduled at least 1 week before you run out of medicine. To do this in an effective manner, make sure that you do not leave the office without scheduling your next medication management appointment. Always ask the nursing staff to show you in your prescription , when your medication will be running out. Then arrange for the receptionist to get you a return appointment, at least 7 days before you run out of medicine. Do not wait until you have 1 or 2 pills left, to come in. This is very poor planning and does not take into consideration that we may need to cancel appointments due to bad weather, sickness, or emergencies affecting our staff.  "Partial Fill": If for any reason your pharmacy does not have enough pills/tablets to completely fill or refill your prescription, do not allow for a "partial fill". You will need a separate prescription to fill the remaining amount, which we will not provide. If the reason for the partial fill is your insurance, you will need to talk to the pharmacist about payment alternatives for the remaining tablets, but again, do not accept a partial fill.  Prescription refills and/or changes in medication(s):   Prescription refills, and/or changes in dose or medication, will be conducted only during scheduled medication management appointments. (Applies to both, written and electronic prescriptions.)  No refills on procedure days. No medication will be changed or started on procedure days. No changes, adjustments, and/or refills will be conducted on a procedure day. Doing so will interfere with the diagnostic portion of the procedure.  No phone refills. No medications will be "called into the pharmacy".  No Fax refills.  No weekend refills.  No Holliday refills.  No after hours  refills.  Remember:  Business hours are:  Monday to Thursday 8:00 AM to 4:00 PM Provider's Schedule: Crystal King, NP - Appointments are:  Medication management: Monday to Thursday 8:00 AM to 4:00 PM Xaine Sansom, MD - Appointments are:  Medication management: Monday and Wednesday 8:00 AM to 4:00 PM Procedure day: Tuesday and Thursday 7:30 AM to 4:00 PM Bilal Lateef, MD - Appointments are:  Medication management: Tuesday and Thursday 8:00 AM to 4:00 PM Procedure day: Monday and Wednesday 7:30 AM to 4:00 PM (Last update: 05/19/2017) ____________________________________________________________________________________________   

## 2018-10-25 NOTE — Unmapped (Signed)
[facility] Refill Coordination Note    Specialty Medication(s) to be Shipped:   CF/Pulmonary: -[name] (ivacaftor) 150mg  tablet    Other medication(s) to be shipped: n/a     Alyssa Willis, DOB: [birth year]  Phone: [phone] (home)       All above HIPAA information was verified with patient's family member. (husband)     Completed refill call assessment today to schedule patient's medication shipment from the [facility] [phone]).       Specialty medication(s) and dose(s) confirmed: Regimen is correct and unchanged.   Changes to medications: [name] reports no changes at this time.  Changes to insurance: No  Questions for the pharmacist: No    Confirmed patient received Welcome Packet with first shipment. The patient will receive a drug information handout for each medication shipped and additional FDA Medication Guides as required.       DISEASE/MEDICATION-SPECIFIC INFORMATION        For CF patients: CF [facility] Active? Yes    SPECIALTY MEDICATION ADHERENCE     Medication Adherence    Patient reported X missed doses in the last month: 0  Specialty Medication: [name] 150mg   Patient is on additional specialty medications: No  Informant: spouse  Support network for adherence: family member                [name] 150 mg: 8 days of medicine on hand          SHIPPING     Shipping address confirmed in Epic.     Delivery Scheduled: Yes, Expected medication delivery date: 10/27/2018.     Medication will be delivered via Next Day Courier to the home address in Epic WAM.    Julianne Rice   [facility] Specialty Pharmacist

## 2018-10-26 MED FILL — KALYDECO 150 MG TABLET: ORAL | 28 days supply | Qty: 56 | Fill #6

## 2018-10-26 MED FILL — KALYDECO 150 MG TABLET: 28 days supply | Qty: 56 | Fill #6 | Status: AC

## 2018-11-07 ENCOUNTER — Telehealth: Payer: Self-pay | Admitting: Pain Medicine

## 2018-11-07 NOTE — Telephone Encounter (Signed)
Asking for med for pain. Advised that no med changes are made by telephone. VV scheduled.

## 2018-11-07 NOTE — Telephone Encounter (Signed)
Attempted to call patient, message left. 

## 2018-11-07 NOTE — Telephone Encounter (Signed)
Patients husband lvmail asking Nurse to call his wife to discuss the pain she is having.

## 2018-11-17 MED FILL — KALYDECO 150 MG TABLET: 28 days supply | Qty: 56 | Fill #7 | Status: AC

## 2018-11-17 MED FILL — KALYDECO 150 MG TABLET: ORAL | 28 days supply | Qty: 56 | Fill #7

## 2018-11-17 NOTE — Unmapped (Signed)
[facility] Refill Coordination Note    Specialty Medication(s) to be Shipped:   CF/Pulmonary: -[name] (ivacaftor) 150mg  tablet    Other medication(s) to be shipped: na     Alyssa Willis, DOB: [birth year]  Phone: [phone] (home)       All above HIPAA information was verified with patient's family member.     Completed refill call assessment today to schedule patient's medication shipment from the [facility] [phone]).       Specialty medication(s) and dose(s) confirmed: Regimen is correct and unchanged.   Changes to medications: [name] reports no changes at this time.  Changes to insurance: No  Questions for the pharmacist: No    Confirmed patient received Welcome Packet with first shipment. The patient will receive a drug information handout for each medication shipped and additional FDA Medication Guides as required.       DISEASE/MEDICATION-SPECIFIC INFORMATION        For CF patients: CF [employer] Active? Yes    SPECIALTY MEDICATION ADHERENCE     Medication Adherence    Patient reported X missed doses in the last month: 0  Specialty Medication: [facility]   Patient is on additional specialty medications: No  Patient is on more than two specialty medications: No  Any gaps in refill history greater than 2 weeks in the last 3 months: no  Demonstrates understanding of importance of adherence: yes  Informant: spouse  Reliability of informant: reliable  Support network for adherence: family member  Confirmed plan for next specialty medication refill: delivery by pharmacy  Refills needed for supportive medications: not needed              [name] . 2 days on hand      SHIPPING     Shipping address confirmed in Epic.     Delivery Scheduled: Yes, Expected medication delivery date: [id number].     Medication will be delivered via Next Day Courier to the home address in Epic WAM.    Alyssa Willis D [name]   [facility] Specialty Technician

## 2018-12-01 ENCOUNTER — Telehealth: Payer: Self-pay

## 2018-12-01 NOTE — Telephone Encounter (Signed)
Patient was called x2 and message left to call so we could get information for Mondays VV.

## 2018-12-03 NOTE — Progress Notes (Signed)
Pain Management Virtual Encounter Note - Virtual Visit via Telephone Telehealth (real-time audio visits between healthcare provider and patient).   Patient's Phone No. & Preferred Pharmacy:  [phone] (home); [phone] (mobile); (Preferred) [phone] [email or web address]  [place], [place] [address] Phone: [phone] Fax: [phone]    Pre-screening note:  Our staff contacted Brenda Clarke and offered her an "in person", "face-to-face" appointment versus a telephone encounter. She indicated preferring the telephone encounter, at this time.   Reason for Virtual Visit: COVID-19*  Social distancing based on CDC and AMA recommendations.   I contacted Brenda Clarke on 12/04/2018 via telephone.      I clearly identified myself as Gaspar Cola, MD. I verified that I was speaking with the correct person using two identifiers (Name: Brenda Clarke, and date of birth: May 04, 1945).  Advanced Informed Consent I sought verbal advanced consent from Brenda Clarke for virtual visit interactions. I informed Brenda Clarke of possible security and privacy concerns, risks, and limitations associated with providing "not-in-person" medical evaluation and management services. I also informed Brenda Clarke of the availability of "in-person" appointments. Finally, I informed her that there would be a charge for the virtual visit and that she could be  personally, fully or partially, financially responsible for it. Brenda Clarke expressed understanding and agreed to proceed.   Historic Elements   Brenda Clarke is a 73 y.o. year old, female patient evaluated today after her last encounter by our practice on 12/01/2018. 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 She also  has a past surgical history that includes Colonoscopy; Bronchoscopy; Nasal sinus surgery; Tonsillectomy; Eye surgery; Cataract extraction w/ intraocular lens  implant, bilateral (Bilateral); and Shoulder arthroscopy with open rotator cuff repair (Left, 09/15/2017). Brenda Clarke has a current medication list which includes the following prescription(s): albuterol, amitriptyline, calcium citrate, cyproheptadine, duloxetine, enalapril, esomeprazole, ezetimibe, gabapentin, ivacaftor, melatonin, meloxicam, metoprolol succinate, multiple vitamins-minerals, sertraline, simvastatin, sodium chloride, temazepam, cholecalciferol, diclofenac, estradiol, galantamine, omeprazole, sertraline, tizanidine, and ventolin hfa. She  reports that she has never smoked. She has never used smokeless tobacco. She reports previous alcohol use. She reports that she does not use drugs. Brenda Clarke is allergic to tobramycin; codeine; galantamine; and hydrocodone.   HPI  Today, she is being contacted for worsening of previously known (established) problem.  Today I called the patient and confirmed that she is definitely having some memory problems.  Even though I have told her that I am prescribing some pain medicine for her in the form of Mobic and gabapentin, she cannot remember taking any of those or the fact that she has enough refills in the pharmacy to last until January 2021.  She was actually calling me to see if I could call into the pharmacy when in fact he has already been electronically sent and all she needs to is remember that she has refills in the pharmacy that she can go get filled.  Unfortunately, she is having problems even with that.  She indicates having upper and lower back pain and I have offered her some lumbar facet blocks, but she indicates that she rather try the medicines first.  I believe that if she  could just remember to  pick up her prescriptions on the pharmacy and continue taking them as prescribed, she would probably be doing better in terms of her pain.  Unfortunately, they are not going to fix her memory problems.  Pharmacotherapy Assessment  Analgesic: No opioid analgesics prescribed at our practice.   Monitoring: Pharmacotherapy: No side-effects or adverse reactions reported. [place] PMP: PDMP reviewed during this encounter.       Compliance: No problems identified. Effectiveness: Clinically acceptable. Plan: Refer to "POC".  UDS:  Summary  Date Value Ref Range Status  12/01/2017 FINAL  Final    Comment:    ==================================================================== TOXASSURE COMP DRUG ANALYSIS,UR ==================================================================== Test                             Result       Flag       Units Drug Present and Declared for Prescription Verification   Sertraline                     PRESENT      EXPECTED   Desmethylsertraline            PRESENT      EXPECTED    Desmethylsertraline is an expected metabolite of sertraline.   Metoprolol                     PRESENT      EXPECTED Drug Present not Declared for Prescription Verification   Oxazepam                       >1099        UNEXPECTED ng/mg creat   Temazepam                      >1099        UNEXPECTED ng/mg creat    Oxazepam and temazepam are expected metabolites of diazepam.    Oxazepam is also an expected metabolite of other benzodiazepine    drugs, including chlordiazepoxide, prazepam, clorazepate,    halazepam, and temazepam.  Oxazepam and temazepam are available    as scheduled prescription medications.   Acetaminophen                  PRESENT      UNEXPECTED   Salicylate                     PRESENT      UNEXPECTED Drug Absent but Declared for Prescription Verification   Zolpidem                       Not Detected UNEXPECTED    Zolpidem, as indicated in the declared  medication list, is not    always detected even when used as directed. ==================================================================== Test                      Result    Flag   Units      Ref Range   Creatinine              182              mg/dL      >=20 ==================================================================== Declared Medications:  The flagging and interpretation on this report are based on the  following declared medications.  Unexpected results may arise from  inaccuracies in the declared  medications.  **Note: The testing scope of this panel includes these medications:  Metoprolol (Toprol)  Sertraline (Zoloft)  **Note: The testing scope of this panel does not include small to  moderate amounts of these reported medications:  Zolpidem (Ambien)  **Note: The testing scope of this panel does not include following  reported medications:  Calcium citrate  Enalapril (Vasotec)  Galantamine (Razadyne)  Ivacaftor  Multivitamin (MVI)  Omega-3 Fatty Acids (Fish Oil)  Omeprazole (Prilosec)  Ranitidine (Zantac)  Risedronate (Actonel)  Simvastatin (Zocor)  Sodium Chloride  Vitamin D3 ==================================================================== For clinical consultation, please call [phone]. ====================================================================    Laboratory Chemistry Profile (12 mo)  Renal: No results found for requested labs within last 8760 hours.  Lab Results  Component Value Date   GFRAA >60 11/17/2017   GFRNONAA >60 11/17/2017   Hepatic: No results found for requested labs within last 8760 hours. Lab Results  Component Value Date   AST 27 11/17/2017   ALT 16 11/17/2017   Other: No results found for requested labs within last 8760 hours. Note: Above Lab results reviewed.  Imaging  Last 90 days:  No results found.  Assessment  The primary encounter diagnosis was Chronic pain syndrome. Diagnoses of Chronic shoulder pain  (Primary Area of Pain) (Left), Chronic upper extremity pain (Secondary Area of Pain) (Left), Chronic low back pain (Tertiary Area of Pain) (Right), and Thoracic facet syndrome (Bilateral) were also pertinent to this visit.  Plan of Care  I am having Brenda Clarke maintain her galantamine, metoprolol succinate, Ivacaftor, sertraline, Multiple Vitamins-Minerals (WOMENS MULTIVITAMIN PO), Calcium Citrate, Sodium Chloride, Cholecalciferol, enalapril, temazepam, Estradiol, DULoxetine, tiZANidine, omeprazole, simvastatin, ezetimibe, cyproheptadine, sertraline, esomeprazole, Melatonin, amitriptyline, gabapentin, meloxicam, albuterol, Ventolin HFA, and diclofenac.  Pharmacotherapy (Medications Ordered): No orders of the defined types were placed in this encounter.  Orders:  Orders Placed This Encounter  Procedures  . THORACIC FACET BLOCK    Scheduling timeframe: (PRN procedure) Brenda Clarke will call when needed. Clinical indication: Upper back pain, secondary to thoracic facet problems. Sedation: Usually done with sedation. (May be done without sedation if so desired by patient.) Requirements: NPO x 8 hrs.; Driver; Stop blood thinners.    Standing Status:   Standing    Number of Occurrences:   1    Standing Expiration Date:   06/02/2020    Scheduling Instructions:     Thoracic Medial Branch Block     Side: Bilateral    Order Specific Question:   Where will this procedure be performed?    Answer:   [facility]   Follow-up plan:   Return in about 18 weeks (around 04/09/2019) for (VV), E/M, (MM).      Interventional management options:  Considering: Diagnostic (Midline) cervicalESI Diagnostic bilateral cervical facet blocks Possible bilateral cervical facet RFA Diagnostic left supraspinatus tendon sheath injection Diagnostic left infraspinatus tendon sheath injection Diagnostic left subacromial bursa injection Diagnostic left subdeltoid bursa injection Diagnostic left  acromioclavicular joint injection Diagnostic left suprascapular nerve block Possible left suprascapularRFA Diagnostic bilateral lumbar facet block Possible bilateral lumbar facet RFA Possible bilateral thoracic facet RFA    Palliative PRN treatment(s): Palliative bilateral thoracic facet block #2 (T2, T3, T4, T5, T6, & T7  MBB)(100/100/50/50-75) Palliative bilateral thoracic facet block #2 (T10, T11, T12, L1, & L2 MBB)(100/100/100x24hrs)    Recent Visits Date Type Provider Dept  10/12/18 Office Visit Milinda Pointer, MD [facility]  Showing recent visits within past 90 days and meeting all other requirements   Today's  Visits Date Type Provider Dept  12/04/18 Office Visit Milinda Pointer, MD [facility]  Showing today's visits and meeting all other requirements   Future Appointments No visits were found meeting these conditions.  Showing future appointments within next 90 days and meeting all other requirements   I discussed the assessment and treatment plan with the patient. The patient was provided an opportunity to ask questions and all were answered. The patient agreed with the plan and demonstrated an understanding of the instructions.  Patient advised to call back or seek an in-person evaluation if the symptoms or condition worsens.  Total duration of non-face-to-face encounter: 12 minutes.  Note by: Gaspar Cola, MD Date: 12/04/2018; Time: 12:13 PM  Note: This dictation was prepared with Dragon dictation. Any transcriptional errors that may result from this process are unintentional.  Disclaimer:  * Given the special circumstances of the COVID-19 pandemic, the federal government has announced that the Office for Civil Rights (OCR) will exercise its enforcement discretion and will not impose penalties on physicians using telehealth in the event of noncompliance with regulatory requirements under the [place]  [place] (HIPAA) in connection with the good faith provision of telehealth during the XX123456 national public health emergency. ([place])

## 2018-12-04 ENCOUNTER — Encounter: Payer: Self-pay | Admitting: Pain Medicine

## 2018-12-04 ENCOUNTER — Telehealth: Payer: Self-pay | Admitting: Pain Medicine

## 2018-12-04 ENCOUNTER — Other Ambulatory Visit: Payer: Self-pay

## 2018-12-04 ENCOUNTER — Ambulatory Visit: Payer: Medicare Other | Attending: Pain Medicine | Admitting: Pain Medicine

## 2018-12-04 DIAGNOSIS — M545 Low back pain, unspecified: Secondary | ICD-10-CM

## 2018-12-04 DIAGNOSIS — M47894 Other spondylosis, thoracic region: Secondary | ICD-10-CM

## 2018-12-04 DIAGNOSIS — M79602 Pain in left arm: Secondary | ICD-10-CM | POA: Diagnosis not present

## 2018-12-04 DIAGNOSIS — G894 Chronic pain syndrome: Secondary | ICD-10-CM

## 2018-12-04 DIAGNOSIS — M25512 Pain in left shoulder: Secondary | ICD-10-CM | POA: Diagnosis not present

## 2018-12-04 DIAGNOSIS — M47814 Spondylosis without myelopathy or radiculopathy, thoracic region: Secondary | ICD-10-CM

## 2018-12-04 DIAGNOSIS — G8929 Other chronic pain: Secondary | ICD-10-CM

## 2018-12-04 NOTE — Patient Instructions (Signed)

## 2018-12-04 NOTE — Telephone Encounter (Signed)
No answer. LVM ?

## 2018-12-04 NOTE — Telephone Encounter (Signed)
Husband called stating Lamani told him Dr. Dossie Arbour was calling in 3 new prescription medications. He went to pharmacy to pick up and was told there were no scripts to pick up. They want to speak with Nurse regarding this to find out if she is getting new meds.

## 2018-12-04 NOTE — Telephone Encounter (Signed)
Spoke with husband. Refills at pharmacy and their are no changes in the dosages.

## 2018-12-05 NOTE — Unmapped (Signed)
[facility] Specialty Pharmacy Refill Coordination Note    Specialty Medication(s) to be Shipped:   CF/Pulmonary: -[name] (ivacaftor) 150mg  tablet     Alyssa Willis, DOB: 11-21-1945  Phone: [phone] (home)     All above HIPAA information was verified with patient's caregiver. Husband    Completed refill call assessment today to schedule patient's medication shipment from the [facility] [phone]).       Specialty medication(s) and dose(s) confirmed: Regimen is correct and unchanged.   Changes to medications: Alyssa Willis reports no changes at this time.  Changes to insurance: No  Questions for the pharmacist: No    Confirmed patient received Welcome Packet with first shipment. The patient will receive a drug information handout for each medication shipped and additional FDA Medication Guides as required.       DISEASE/MEDICATION-SPECIFIC INFORMATION        For CF patients: CF Healthwell [place] Active? Yes, HWG active VST til 02/18/19    SPECIALTY MEDICATION ADHERENCE     Medication Adherence    Patient reported X missed doses in the last month: 0  Specialty Medication: Alyssa Willis 150mg   Patient is on additional specialty medications: No  Support network for adherence: family member        [name] 150 mg: 8 days of medicine on hand     SHIPPING     Shipping address confirmed in Epic.     Delivery Scheduled: Yes, Expected medication delivery date: 12/11/2018.     Medication will be delivered via Same Day Courier to the home address in [employer] [place].    Nyx Keady P Allena Katz   [facility] Shared [facility] Specialty Technician

## 2018-12-08 ENCOUNTER — Telehealth: Payer: Self-pay

## 2018-12-08 NOTE — Telephone Encounter (Signed)
Message from [facility] that patient called and does not want to have AWV.

## 2018-12-08 NOTE — Telephone Encounter (Signed)
Copied from [place] [id number]. Topic: Medicare AWV >> Dec 08, 2018 11:39 AM Gerre Pebbles R wrote: Reason for CRM: Left message for patient to call back and schedule Medicare Annual Wellness Visit (AWV) either virtually/audio only.  No hx; please schedule at anytime with Denisa O'Brien-Blaney at [facility] for [facility] to schedule >> Dec 08, 2018 11:55 AM Carolyn Stare wrote:  Pt call and does not want to have AWV

## 2018-12-08 NOTE — Telephone Encounter (Signed)
Noted. Return call to care guide direct dial for all future scheduling.

## 2018-12-11 MED FILL — KALYDECO 150 MG TABLET: ORAL | 28 days supply | Qty: 56 | Fill #8

## 2018-12-11 MED FILL — KALYDECO 150 MG TABLET: 28 days supply | Qty: 56 | Fill #8 | Status: AC

## 2018-12-11 NOTE — Telephone Encounter (Signed)
NOTED. THANK YOU.

## 2019-01-05 MED ORDER — NEBULIZERS: each | 3 refills | 0 days

## 2019-01-05 NOTE — Unmapped (Signed)
[facility] Clinical Assessment & Refill Coordination Note    Alyssa Willis, DOB: November 14, 1945  Phone: [phone] (home)     All above HIPAA information was verified with patient's family member. (husband)     Specialty Medication(s):   CF/Pulmonary: -[name] (ivacaftor) 150mg  tablet     Current Outpatient Medications   Medication Sig Dispense Refill   ??? CALCIUM CARBONATE (CALCIUM 500 ORAL) Take 1,000 mg by mouth daily.     ??? cholecalciferol, vitamin D3, 5,000 unit Tab Take 5,000 Int'l Units by mouth daily.     ??? cyproheptadine (PERIACTIN) 4 mg tablet TAKE 1 TABLET BY MOUTH THREE TIMES DAILY AS NEEDED 270 each 0   ??? enalapril (VASOTEC) 5 MG tablet Take 2.5 mg by mouth daily.      ??? estradiol (ESTRACE) 0.01 % (0.1 mg/gram) vaginal cream Insert 0.5 g into the vagina Two (2) times a week. 42.5 g 4   ??? ivacaftor 150 mg Tab Take 1 tablet by mouth every twelve (12) hours. 56 tablet 11   ??? metoprolol succinate (TOPROL-XL) 25 MG 24 hr tablet Take 25 mg by mouth Two (2) times a day.      ??? multivit-min-FA-lycopen-lutein (CENTRUM SILVER) 0.4-300-250 mg-mcg-mcg Tab Take 1 tablet by mouth daily at 0600.      ??? nebulizer accessories (REUSABLE NEBULIZER KIT) Kit Please dispense one Pari LC Plus reusable nebulizer cup to be used with inhaled medications (hypertonic saline). Dx Code: J47.9, E84.0 1 kit 2   ??? PARI LC D NEBULIZER Misc Provide 1 device  for use with inhaled medication. 3 each 3   ??? peg-electrolyte soln (GOLYTELY) 420 gram SolR May substitute with Joselyn Arrow, Nulytely TriLyte. May use flavor Packets? Take as directed 1 Bottle 0   ??? sertraline (ZOLOFT) 100 MG tablet Take 100 mg by mouth daily.     ??? simvastatin (ZOCOR) 80 MG tablet Take 80 mg by mouth nightly.     ??? sodium chloride 10 % Nebu inhale 4 ml via nebulizer once daily 750 mL 3   ??? VENTOLIN HFA 90 mcg/actuation inhaler INHALE 2 PUFFS INTO LUNGS EVERY 6 HOURS AS NEEDED FOR WHEEZING (AND BEFROE HYPERTONIC SALINE) 54 g 0 ??? zolpidem (AMBIEN CR) 6.25 MG CR tablet Take 6.25 mg by mouth nightly as needed for sleep.       No current facility-administered medications for this visit.         Changes to medications: [name] reports no changes at this time.    Allergies   Allergen Reactions   ??? Tobramycin Shortness Of Breath     wheezing   ??? Tobi [Tobramycin In 0.225 % Nacl] Other (See Comments)     wheezing  wheezing   ??? Codeine Itching and Nausea Only   ??? Galantamine Nausea Only   ??? Hydrocodone Itching       Changes to allergies: No    SPECIALTY MEDICATION ADHERENCE     [name] 150 mg: 7 days of medicine on hand        Medication Adherence    Patient reported X missed doses in the last month: 0  Specialty Medication: [name] 150mg    Patient is on additional specialty medications: No  Support network for adherence: family member          Specialty medication(s) dose(s) confirmed: Regimen is correct and unchanged.     Are there any concerns with adherence? No    Adherence counseling provided? Not needed  CLINICAL MANAGEMENT AND INTERVENTION      Clinical Benefit Assessment:    Do you feel the medicine is effective or helping your condition? Patient declined to answer    Clinical Benefit counseling provided? Not needed    Adverse Effects Assessment:    Are you experiencing any side effects? No    Are you experiencing difficulty administering your medicine? No    Quality of Life Assessment:    How many days over the past month did your Cf  keep you from your normal activities? For example, brushing your teeth or getting up in the morning. 0    Have you discussed this with your provider? Not needed    Therapy Appropriateness:    Is therapy appropriate? Yes, therapy is appropriate and should be continued    DISEASE/MEDICATION-SPECIFIC INFORMATION      For CF patients: CF Healthwell [place] Active? Yes    PATIENT SPECIFIC NEEDS     ? Does the patient have any physical, cognitive, or cultural barriers? No    ? Is the patient high risk? No ? Does the patient require a Care Management Plan? No     ? Does the patient require physician intervention or other additional services (i.e. nutrition, smoking cessation, social work)? No      SHIPPING     Specialty Medication(s) to be Shipped:   CF/Pulmonary: -[name] (ivacaftor) 150mg  tablet    Other medication(s) to be shipped: HTS 10% , Neb Cup      Changes to insurance: No    Delivery Scheduled: Yes, Expected medication delivery date: 10/20.     Medication will be delivered via Next Day Courier to the confirmed home address in [facility].    The patient will receive a drug information handout for each medication shipped and additional FDA Medication Guides as required.  Verified that patient has previously received a Conservation officer, historic buildings.    All of the patient's questions and concerns have been addressed.    Julianne Rice   [facility] Shared [facility] Specialty Pharmacist

## 2019-01-08 MED FILL — SODIUM CHLORIDE 10 % FOR NEBULIZATION: 30 days supply | Qty: 750 | Fill #1 | Status: AC

## 2019-01-08 MED FILL — LC PLUS MISC: 30 days supply | Qty: 1 | Fill #0 | Status: AC

## 2019-01-08 MED FILL — KALYDECO 150 MG TABLET: ORAL | 28 days supply | Qty: 56 | Fill #9

## 2019-01-08 MED FILL — LC PLUS MISC: 30 days supply | Qty: 1 | Fill #0

## 2019-01-08 MED FILL — SODIUM CHLORIDE 10 % FOR NEBULIZATION: 30 days supply | Qty: 750 | Fill #1

## 2019-01-08 MED FILL — KALYDECO 150 MG TABLET: 28 days supply | Qty: 56 | Fill #9 | Status: AC

## 2019-01-23 NOTE — Unmapped (Signed)
Patient does not need a refill of specialty medication at this time. Moving specialty refill reminder call to appropriate date and removed call attempt data.  Spoke with patient's husband and he states she still has a couple of weeks worth of medication [name]) and does not need any refills at this time. [name] states patient has not missed any doses or started any new medication.

## 2019-01-30 NOTE — Unmapped (Signed)
-----   Message from Cicero Duck, RN sent at 01/29/2019  7:56 PM EST -----  Regarding: Annual Visit in December with Mariane Masters Evening,    This patient is see yearly and would be scheduled in December. I am not sure Dr Curley Spice has a preference, but probably in person. However, if the patient is anxious to come, welcome to schedule at a virtual.     Thank you,  Joni Reining

## 2019-02-01 NOTE — Unmapped (Signed)
-----   Message from Cicero Duck, RN sent at 01/29/2019  7:56 PM EST -----  Regarding: Annual Visit in December with Mariane Masters Evening,    This patient is see yearly and would be scheduled in December. I am not sure Dr Curley Spice has a preference, but probably in person. However, if the patient is anxious to come, welcome to schedule at a virtual.     Thank you,  Joni Reining

## 2019-02-05 NOTE — Unmapped (Signed)
[facility] Pharm[facility] Refill Coordination Note    Specialty Medication(s) to be Shipped:   CF/Pulmonary: -[name] (ivacaftor) 150mg  tablet  Other medication(s) to be shipped: Sodium Chloride 10% & LC Plus Neb Cups     Alyssa Willis, DOB: 01-31-1946  Phone: [phone] (home)     All above HIPAA information was verified with patient's caregiver. Husband, [name]    Completed refill call assessment today to schedule patient's medication shipment from the [facility] [phone]).       Specialty medication(s) and dose(s) confirmed: Regimen is correct and unchanged.   Changes to medications: [name] reports no changes at this time.  Changes to insurance: No  Questions for the pharmacist: No    Confirmed patient received Welcome Packet with first shipment. The patient will receive a drug information handout for each medication shipped and additional FDA Medication Guides as required.       DISEASE/MEDICATION-SPECIFIC INFORMATION        For CF patients: CF Healthwell [place] Active? Yes, **HWG active VST til 02/18/19**    SPECIALTY MEDICATION ADHERENCE     Medication Adherence    Patient reported X missed doses in the last month: 0  Specialty Medication: [name] 150mg   Patient is on additional specialty medications: No  Informant: spouse  Reliability of informant: reliable  Support network for adherence: family member        [name] 150 mg: 7 days of medicine on hand     SHIPPING     Shipping address confirmed in Epic.     Delivery Scheduled: Yes, Expected medication delivery date: 02/09/2019.     Medication will be delivered via Same Day Courier to the home address in [employer] [place].    [name] P Allena Katz   [facility] Shared [facility] Specialty Technician

## 2019-02-09 MED FILL — SODIUM CHLORIDE 10 % FOR NEBULIZATION: 30 days supply | Qty: 750 | Fill #2

## 2019-02-09 MED FILL — KALYDECO 150 MG TABLET: 28 days supply | Qty: 56 | Fill #10 | Status: AC

## 2019-02-09 MED FILL — LC PLUS MISC: 30 days supply | Qty: 1 | Fill #1

## 2019-02-09 MED FILL — KALYDECO 150 MG TABLET: ORAL | 28 days supply | Qty: 56 | Fill #10

## 2019-02-09 MED FILL — SODIUM CHLORIDE 10 % FOR NEBULIZATION: 30 days supply | Qty: 750 | Fill #2 | Status: AC

## 2019-02-09 MED FILL — LC PLUS MISC: 30 days supply | Qty: 1 | Fill #1 | Status: AC

## 2019-02-12 ENCOUNTER — Other Ambulatory Visit: Payer: Self-pay | Admitting: Pain Medicine

## 2019-02-12 DIAGNOSIS — R12 Heartburn: Secondary | ICD-10-CM

## 2019-02-23 ENCOUNTER — Ambulatory Visit
Admission: RE | Admit: 2019-02-23 | Discharge: 2019-02-23 | Disposition: A | Discharge status: Home / Self Care | Payer: Medicare Other | Source: Ambulatory Visit | Attending: Internal Medicine | Admitting: Internal Medicine

## 2019-02-23 DIAGNOSIS — Z1231 Encounter for screening mammogram for malignant neoplasm of breast: Secondary | ICD-10-CM | POA: Diagnosis present

## 2019-03-05 NOTE — Unmapped (Addendum)
Spoke with patient's husband [name] and he stated that the patient has passed away on 03/28/2019. I apologized for not knowing and calling for refills, but advised him that we would take her off the call list.      This patient has been disenrolled from the [facility] specialty pharmacy services due to patient has passed away.    Julianne Rice  [facility] Specialty Pharmacist

## 2019-03-05 NOTE — Unmapped (Signed)
Thanks for letting us know. 

## 2019-03-05 NOTE — Unmapped (Signed)
[name] [name] just sent me a mychart message informing me Ms. Yoon passed away in her sleep on 03/10/2019. I called him to express my condolences but there was no answer. Marked EMR as deceased.

## 2019-03-23 DEATH — deceased

## 2019-04-09 ENCOUNTER — Telehealth: Payer: Medicare Other | Admitting: Pain Medicine

## 2021-10-01 IMAGING — MG DIGITAL SCREENING BILAT W/ TOMO W/ CAD
6 of 10 series · 6 of 30 positions shown · non-contrast
Comparison: Previous exam(s).

CLINICAL DATA: Screening.

EXAM:
DIGITAL SCREENING BILATERAL MAMMOGRAM WITH TOMO AND CAD

[R MLO synth-2D (1 of 2)]
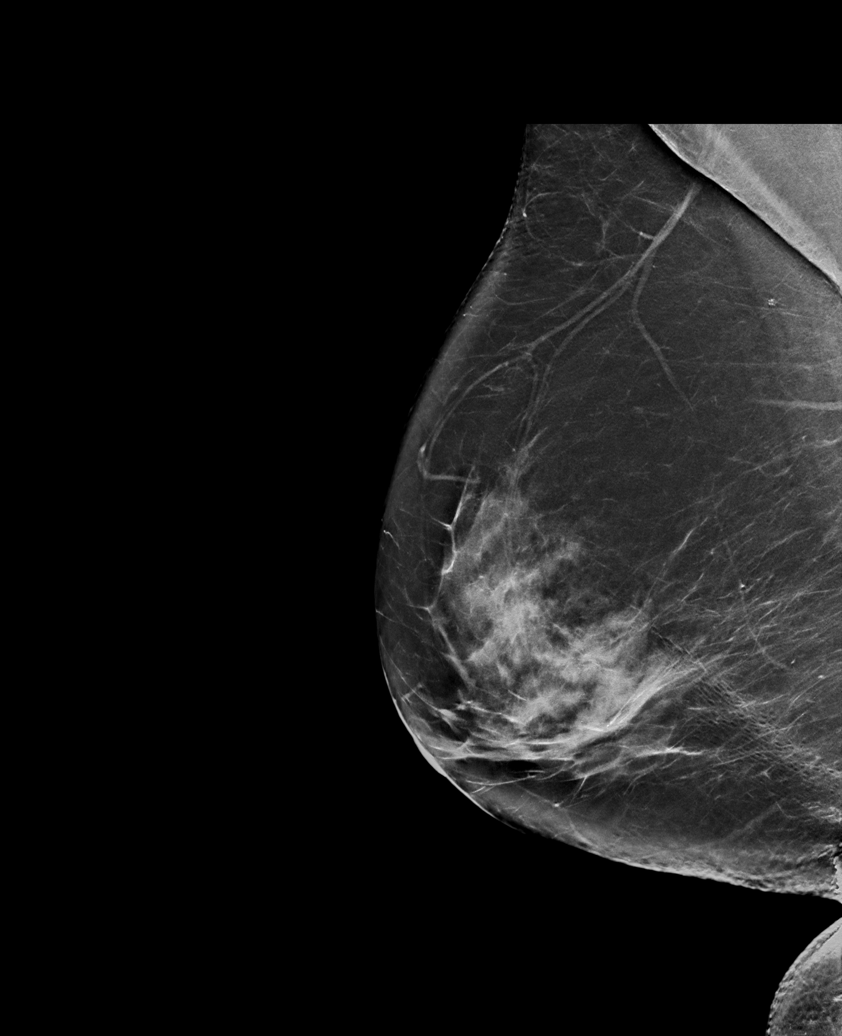

[L CC synth-2D]
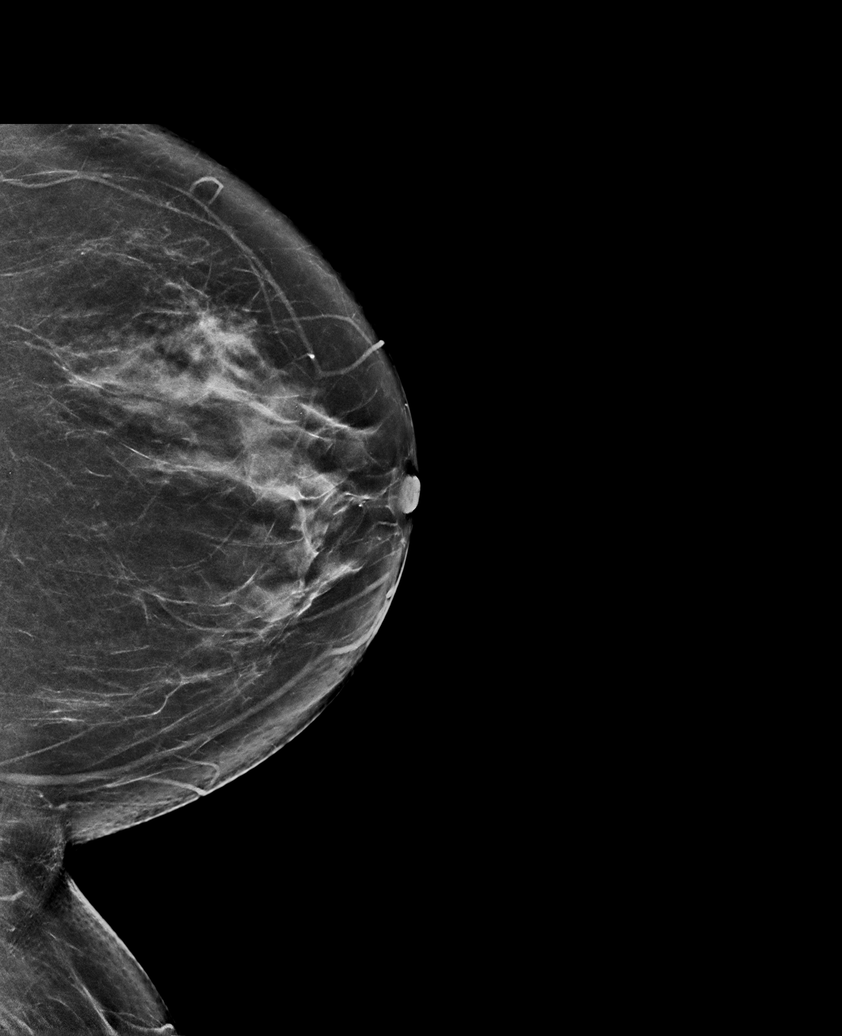

[R CC synth-2D]
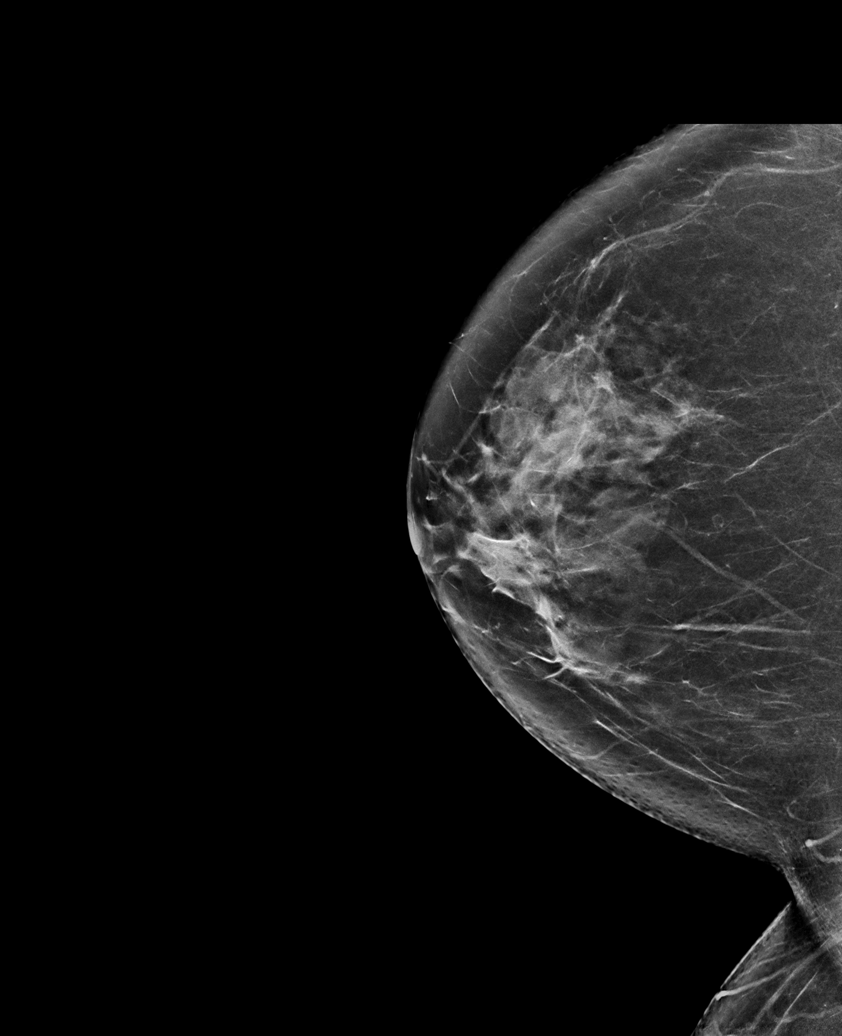

[L MLO synth-2D]
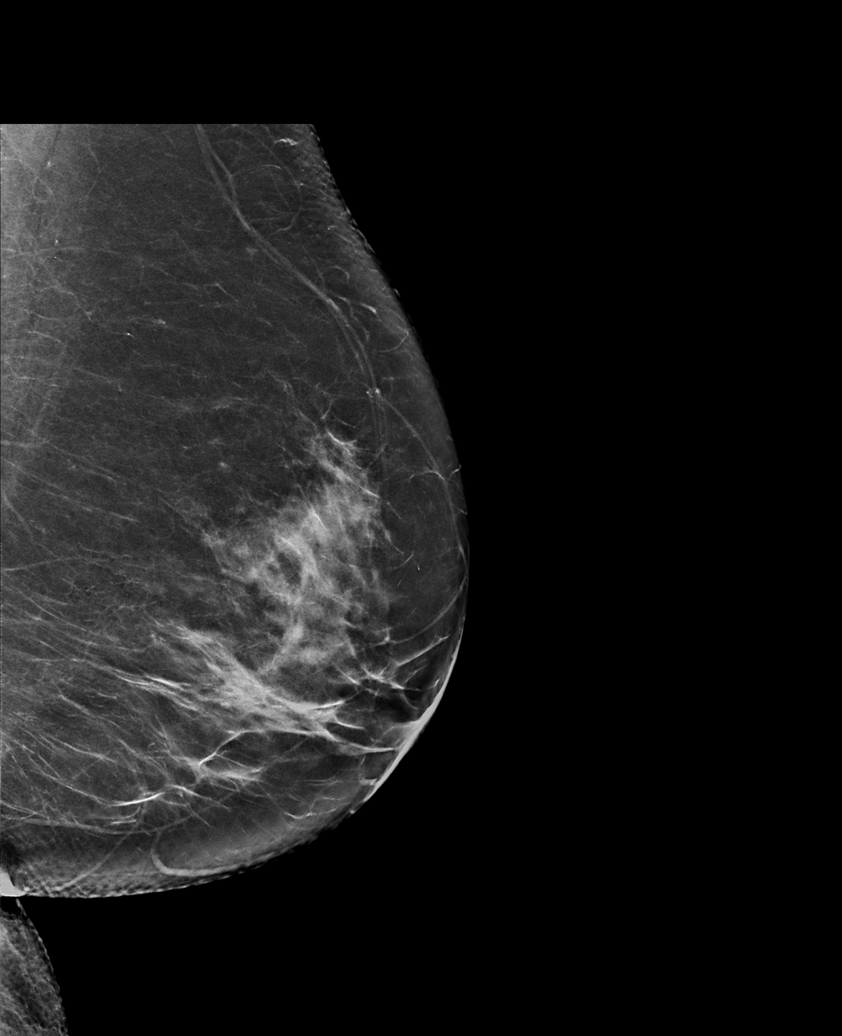

[R MLO synth-2D (2 of 2)]
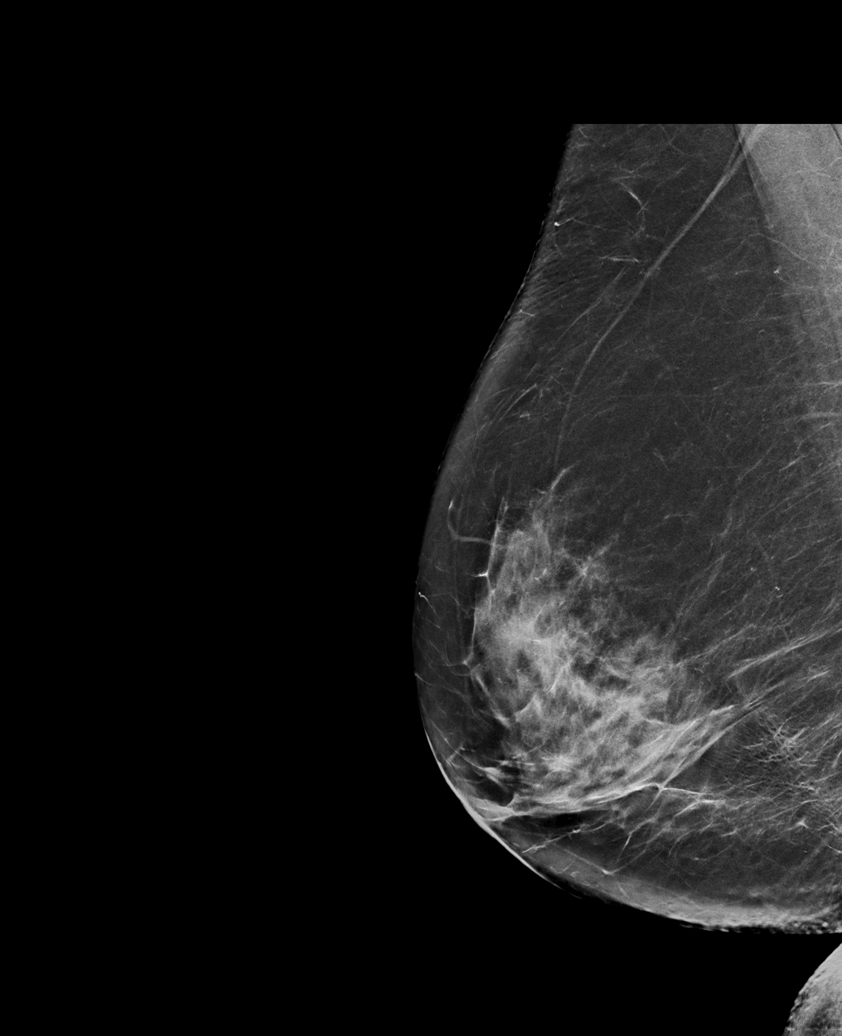

[R MLO tomo · tomo slice 39/76.0]
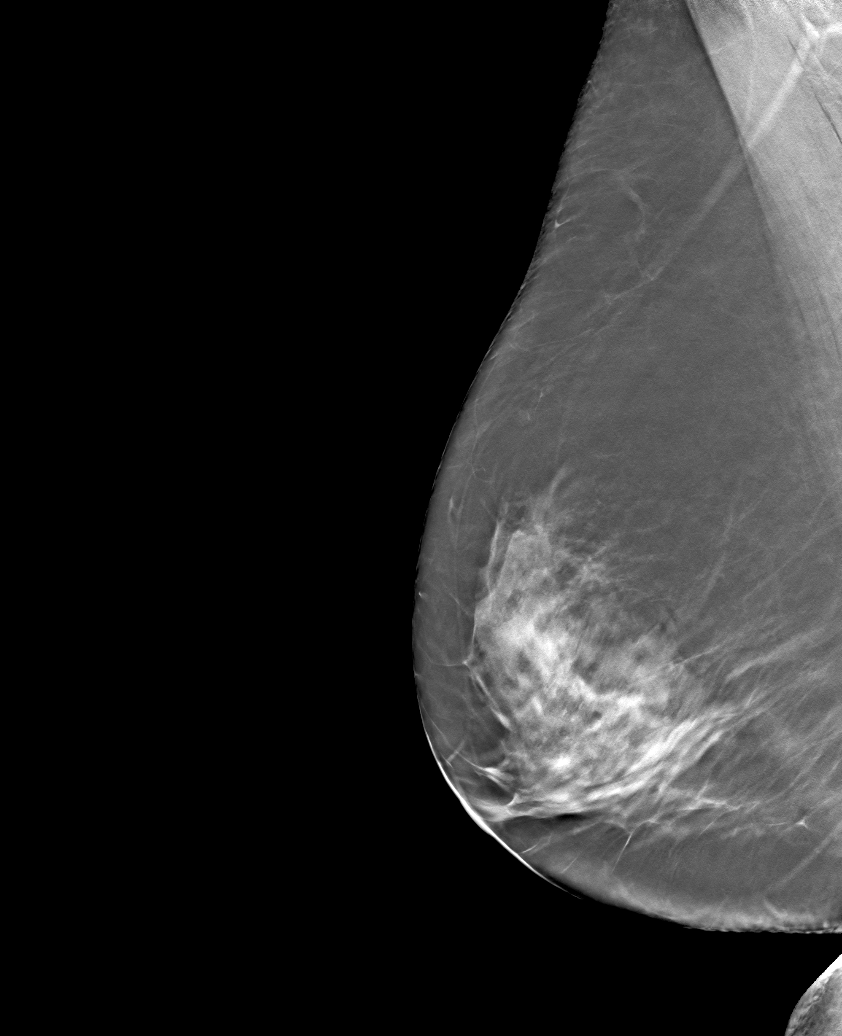

[6 of 30 positions shown; findings below may reference images not displayed]

ACR Breast Density Category c: The breast tissue is heterogeneously
dense, which may obscure small masses.
FINDINGS: There are no findings suspicious for malignancy. Images were
processed with CAD.
IMPRESSION: No mammographic evidence of malignancy. A result letter of this
screening mammogram will be mailed directly to the patient.

RECOMMENDATION:
Screening mammogram in one year. (Code:[id number])

BI-RADS CATEGORY  1: Negative.
